# Patient Record
Sex: Female | Born: 1935 | Race: White | Hispanic: No | Marital: Married | Smoking: Former smoker
Health system: Southern US, Community
[De-identification: ages and names within clinical notes are randomized; demographics above are authoritative.]

## PROBLEM LIST (undated history)

## (undated) DIAGNOSIS — I420 Dilated cardiomyopathy: Secondary | ICD-10-CM

## (undated) DIAGNOSIS — R7302 Impaired glucose tolerance (oral): Secondary | ICD-10-CM

## (undated) DIAGNOSIS — C801 Malignant (primary) neoplasm, unspecified: Secondary | ICD-10-CM

## (undated) DIAGNOSIS — I10 Essential (primary) hypertension: Secondary | ICD-10-CM

## (undated) DIAGNOSIS — I428 Other cardiomyopathies: Secondary | ICD-10-CM

## (undated) DIAGNOSIS — I739 Peripheral vascular disease, unspecified: Secondary | ICD-10-CM

## (undated) DIAGNOSIS — I82409 Acute embolism and thrombosis of unspecified deep veins of unspecified lower extremity: Secondary | ICD-10-CM

## (undated) DIAGNOSIS — G510 Bell's palsy: Secondary | ICD-10-CM

## (undated) DIAGNOSIS — I6529 Occlusion and stenosis of unspecified carotid artery: Secondary | ICD-10-CM

## (undated) DIAGNOSIS — I509 Heart failure, unspecified: Secondary | ICD-10-CM

## (undated) DIAGNOSIS — D689 Coagulation defect, unspecified: Secondary | ICD-10-CM

## (undated) DIAGNOSIS — E785 Hyperlipidemia, unspecified: Secondary | ICD-10-CM

## (undated) DIAGNOSIS — K635 Polyp of colon: Secondary | ICD-10-CM

## (undated) HISTORY — DX: Coagulation defect, unspecified: D68.9

## (undated) HISTORY — DX: Dilated cardiomyopathy: I42.0

## (undated) HISTORY — PX: POLYPECTOMY: SHX149

## (undated) HISTORY — PX: OOPHORECTOMY: SHX86

## (undated) HISTORY — DX: Essential (primary) hypertension: I10

## (undated) HISTORY — DX: Other cardiomyopathies: I42.8

## (undated) HISTORY — DX: Impaired glucose tolerance (oral): R73.02

## (undated) HISTORY — DX: Polyp of colon: K63.5

## (undated) HISTORY — DX: Occlusion and stenosis of unspecified carotid artery: I65.29

## (undated) HISTORY — PX: COLONOSCOPY: SHX174

## (undated) HISTORY — DX: Peripheral vascular disease, unspecified: I73.9

## (undated) HISTORY — PX: TUBAL LIGATION: SHX77

## (undated) HISTORY — DX: Bell's palsy: G51.0

## (undated) HISTORY — DX: Heart failure, unspecified: I50.9

## (undated) HISTORY — DX: Acute embolism and thrombosis of unspecified deep veins of unspecified lower extremity: I82.409

## (undated) HISTORY — DX: Malignant (primary) neoplasm, unspecified: C80.1

## (undated) HISTORY — DX: Hyperlipidemia, unspecified: E78.5

## (undated) HISTORY — PX: OTHER SURGICAL HISTORY: SHX169

---

## 1981-12-19 HISTORY — PX: LUMBAR FUSION: SHX111

## 1998-04-24 ENCOUNTER — Ambulatory Visit (HOSPITAL_COMMUNITY): Admission: RE | Admit: 1998-04-24 | Discharge: 1998-04-24 | Payer: Self-pay | Admitting: *Deleted

## 1998-05-04 ENCOUNTER — Ambulatory Visit (HOSPITAL_COMMUNITY): Admission: RE | Admit: 1998-05-04 | Discharge: 1998-05-04 | Payer: Self-pay

## 1998-05-11 ENCOUNTER — Ambulatory Visit (HOSPITAL_COMMUNITY): Admission: RE | Admit: 1998-05-11 | Discharge: 1998-05-11 | Payer: Self-pay

## 1998-05-18 ENCOUNTER — Ambulatory Visit (HOSPITAL_COMMUNITY): Admission: RE | Admit: 1998-05-18 | Discharge: 1998-05-18 | Payer: Self-pay

## 1998-05-22 ENCOUNTER — Ambulatory Visit (HOSPITAL_COMMUNITY): Admission: RE | Admit: 1998-05-22 | Discharge: 1998-05-22 | Payer: Self-pay | Admitting: Gynecology

## 1998-05-29 ENCOUNTER — Ambulatory Visit (HOSPITAL_COMMUNITY): Admission: RE | Admit: 1998-05-29 | Discharge: 1998-05-29 | Payer: Self-pay | Admitting: Gynecology

## 1998-06-05 ENCOUNTER — Ambulatory Visit (HOSPITAL_COMMUNITY): Admission: RE | Admit: 1998-06-05 | Discharge: 1998-06-05 | Payer: Self-pay | Admitting: Gynecology

## 1998-06-15 ENCOUNTER — Ambulatory Visit (HOSPITAL_COMMUNITY): Admission: RE | Admit: 1998-06-15 | Discharge: 1998-06-15 | Payer: Self-pay | Admitting: Gynecology

## 1998-06-23 ENCOUNTER — Ambulatory Visit (HOSPITAL_COMMUNITY): Admission: RE | Admit: 1998-06-23 | Discharge: 1998-06-23 | Payer: Self-pay | Admitting: Gynecology

## 1998-07-01 ENCOUNTER — Ambulatory Visit: Admission: RE | Admit: 1998-07-01 | Discharge: 1998-07-01 | Payer: Self-pay | Admitting: Gynecology

## 1998-07-09 ENCOUNTER — Ambulatory Visit (HOSPITAL_COMMUNITY): Admission: RE | Admit: 1998-07-09 | Discharge: 1998-07-09 | Payer: Self-pay | Admitting: Gynecology

## 1998-07-16 ENCOUNTER — Ambulatory Visit (HOSPITAL_COMMUNITY): Admission: RE | Admit: 1998-07-16 | Discharge: 1998-07-16 | Payer: Self-pay | Admitting: Gynecology

## 1998-12-16 ENCOUNTER — Encounter: Payer: Self-pay | Admitting: Gynecology

## 1998-12-16 ENCOUNTER — Ambulatory Visit (HOSPITAL_COMMUNITY): Admission: RE | Admit: 1998-12-16 | Discharge: 1998-12-16 | Payer: Self-pay | Admitting: Gynecology

## 1998-12-22 ENCOUNTER — Ambulatory Visit: Admission: RE | Admit: 1998-12-22 | Discharge: 1998-12-22 | Payer: Self-pay | Admitting: Gynecology

## 1999-04-07 ENCOUNTER — Ambulatory Visit: Admission: RE | Admit: 1999-04-07 | Discharge: 1999-04-07 | Payer: Self-pay | Admitting: Gynecology

## 1999-07-06 ENCOUNTER — Encounter: Payer: Self-pay | Admitting: Gynecology

## 1999-07-06 ENCOUNTER — Ambulatory Visit (HOSPITAL_COMMUNITY): Admission: RE | Admit: 1999-07-06 | Discharge: 1999-07-06 | Payer: Self-pay | Admitting: Gynecology

## 1999-07-25 ENCOUNTER — Encounter: Payer: Self-pay | Admitting: Emergency Medicine

## 1999-07-25 ENCOUNTER — Emergency Department (HOSPITAL_COMMUNITY): Admission: EM | Admit: 1999-07-25 | Discharge: 1999-07-25 | Payer: Self-pay | Admitting: Emergency Medicine

## 1999-08-10 ENCOUNTER — Ambulatory Visit: Admission: RE | Admit: 1999-08-10 | Discharge: 1999-08-10 | Payer: Self-pay | Admitting: Gynecology

## 1999-11-25 ENCOUNTER — Ambulatory Visit (HOSPITAL_COMMUNITY): Admission: RE | Admit: 1999-11-25 | Discharge: 1999-11-25 | Payer: Self-pay | Admitting: Gynecology

## 1999-11-25 ENCOUNTER — Encounter: Payer: Self-pay | Admitting: Gynecology

## 1999-11-30 ENCOUNTER — Other Ambulatory Visit: Admission: RE | Admit: 1999-11-30 | Discharge: 1999-11-30 | Payer: Self-pay | Admitting: Gynecology

## 1999-11-30 ENCOUNTER — Ambulatory Visit: Admission: RE | Admit: 1999-11-30 | Discharge: 1999-11-30 | Payer: Self-pay | Admitting: Gynecology

## 1999-12-06 ENCOUNTER — Encounter: Admission: RE | Admit: 1999-12-06 | Discharge: 1999-12-06 | Payer: Self-pay | Admitting: Gynecology

## 1999-12-06 ENCOUNTER — Encounter: Payer: Self-pay | Admitting: Gynecology

## 2000-03-15 ENCOUNTER — Ambulatory Visit: Admission: RE | Admit: 2000-03-15 | Discharge: 2000-03-15 | Payer: Self-pay | Admitting: Gynecology

## 2000-03-15 ENCOUNTER — Other Ambulatory Visit: Admission: RE | Admit: 2000-03-15 | Discharge: 2000-03-15 | Payer: Self-pay | Admitting: Gynecology

## 2000-05-29 ENCOUNTER — Encounter: Payer: Self-pay | Admitting: Gynecology

## 2000-05-29 ENCOUNTER — Ambulatory Visit (HOSPITAL_COMMUNITY): Admission: RE | Admit: 2000-05-29 | Discharge: 2000-05-29 | Payer: Self-pay | Admitting: Gynecology

## 2000-05-31 ENCOUNTER — Ambulatory Visit: Admission: RE | Admit: 2000-05-31 | Discharge: 2000-05-31 | Payer: Self-pay | Admitting: Gynecology

## 2000-05-31 ENCOUNTER — Other Ambulatory Visit: Admission: RE | Admit: 2000-05-31 | Discharge: 2000-05-31 | Payer: Self-pay | Admitting: Gynecology

## 2000-09-12 ENCOUNTER — Ambulatory Visit: Admission: RE | Admit: 2000-09-12 | Discharge: 2000-09-12 | Payer: Self-pay | Admitting: Gynecology

## 2000-10-24 ENCOUNTER — Other Ambulatory Visit: Admission: RE | Admit: 2000-10-24 | Discharge: 2000-10-24 | Payer: Self-pay | Admitting: Orthopedic Surgery

## 2000-11-15 ENCOUNTER — Ambulatory Visit (HOSPITAL_COMMUNITY): Admission: RE | Admit: 2000-11-15 | Discharge: 2000-11-15 | Payer: Self-pay | Admitting: Gynecology

## 2000-11-20 ENCOUNTER — Encounter: Payer: Self-pay | Admitting: Gynecology

## 2000-11-20 ENCOUNTER — Ambulatory Visit (HOSPITAL_COMMUNITY): Admission: RE | Admit: 2000-11-20 | Discharge: 2000-11-20 | Payer: Self-pay | Admitting: Gynecology

## 2000-11-29 ENCOUNTER — Ambulatory Visit: Admission: RE | Admit: 2000-11-29 | Discharge: 2000-11-29 | Payer: Self-pay | Admitting: Gynecology

## 2000-11-29 ENCOUNTER — Other Ambulatory Visit: Admission: RE | Admit: 2000-11-29 | Discharge: 2000-11-29 | Payer: Self-pay | Admitting: Gynecology

## 2000-12-06 ENCOUNTER — Encounter: Payer: Self-pay | Admitting: Gynecology

## 2000-12-06 ENCOUNTER — Encounter: Admission: RE | Admit: 2000-12-06 | Discharge: 2000-12-06 | Payer: Self-pay | Admitting: Gynecology

## 2001-05-17 ENCOUNTER — Ambulatory Visit: Admission: RE | Admit: 2001-05-17 | Discharge: 2001-05-17 | Payer: Self-pay | Admitting: Gynecology

## 2001-05-18 ENCOUNTER — Ambulatory Visit (HOSPITAL_COMMUNITY): Admission: RE | Admit: 2001-05-18 | Discharge: 2001-05-18 | Payer: Self-pay | Admitting: Gynecology

## 2001-05-18 ENCOUNTER — Encounter: Payer: Self-pay | Admitting: Gynecology

## 2001-05-23 ENCOUNTER — Ambulatory Visit: Admission: RE | Admit: 2001-05-23 | Discharge: 2001-05-23 | Payer: Self-pay | Admitting: Gynecology

## 2001-12-04 ENCOUNTER — Other Ambulatory Visit: Admission: RE | Admit: 2001-12-04 | Discharge: 2001-12-04 | Payer: Self-pay | Admitting: Gynecology

## 2001-12-04 ENCOUNTER — Ambulatory Visit: Admission: RE | Admit: 2001-12-04 | Discharge: 2001-12-04 | Payer: Self-pay | Admitting: Gynecology

## 2001-12-04 ENCOUNTER — Encounter (INDEPENDENT_AMBULATORY_CARE_PROVIDER_SITE_OTHER): Payer: Self-pay | Admitting: *Deleted

## 2001-12-07 ENCOUNTER — Encounter: Admission: RE | Admit: 2001-12-07 | Discharge: 2001-12-07 | Payer: Self-pay | Admitting: Gynecology

## 2001-12-07 ENCOUNTER — Encounter: Payer: Self-pay | Admitting: Gynecology

## 2002-03-28 ENCOUNTER — Other Ambulatory Visit: Admission: RE | Admit: 2002-03-28 | Discharge: 2002-03-28 | Payer: Self-pay | Admitting: Gynecology

## 2002-04-03 ENCOUNTER — Encounter: Admission: RE | Admit: 2002-04-03 | Discharge: 2002-04-03 | Payer: Self-pay | Admitting: Gynecology

## 2002-04-03 ENCOUNTER — Encounter: Payer: Self-pay | Admitting: Gynecology

## 2002-05-28 ENCOUNTER — Encounter: Payer: Self-pay | Admitting: Gynecology

## 2002-05-28 ENCOUNTER — Ambulatory Visit (HOSPITAL_COMMUNITY): Admission: RE | Admit: 2002-05-28 | Discharge: 2002-05-28 | Payer: Self-pay | Admitting: Gynecology

## 2002-06-12 ENCOUNTER — Other Ambulatory Visit: Admission: RE | Admit: 2002-06-12 | Discharge: 2002-06-12 | Payer: Self-pay | Admitting: Gynecology

## 2002-06-12 ENCOUNTER — Ambulatory Visit: Admission: RE | Admit: 2002-06-12 | Discharge: 2002-06-12 | Payer: Self-pay | Admitting: Gynecology

## 2002-06-12 ENCOUNTER — Encounter (INDEPENDENT_AMBULATORY_CARE_PROVIDER_SITE_OTHER): Payer: Self-pay | Admitting: *Deleted

## 2002-12-09 ENCOUNTER — Encounter: Payer: Self-pay | Admitting: Gynecology

## 2002-12-09 ENCOUNTER — Encounter: Admission: RE | Admit: 2002-12-09 | Discharge: 2002-12-09 | Payer: Self-pay | Admitting: Gynecology

## 2002-12-25 ENCOUNTER — Ambulatory Visit: Admission: RE | Admit: 2002-12-25 | Discharge: 2002-12-25 | Payer: Self-pay | Admitting: Gynecology

## 2003-03-31 ENCOUNTER — Other Ambulatory Visit: Admission: RE | Admit: 2003-03-31 | Discharge: 2003-03-31 | Payer: Self-pay | Admitting: Gynecology

## 2003-07-01 ENCOUNTER — Ambulatory Visit (HOSPITAL_COMMUNITY): Admission: RE | Admit: 2003-07-01 | Discharge: 2003-07-01 | Payer: Self-pay | Admitting: Gynecology

## 2003-07-07 ENCOUNTER — Ambulatory Visit (HOSPITAL_COMMUNITY): Admission: RE | Admit: 2003-07-07 | Discharge: 2003-07-07 | Payer: Self-pay | Admitting: Gynecology

## 2003-07-07 ENCOUNTER — Encounter: Payer: Self-pay | Admitting: Gynecology

## 2003-07-09 ENCOUNTER — Ambulatory Visit: Admission: RE | Admit: 2003-07-09 | Discharge: 2003-07-09 | Payer: Self-pay | Admitting: Gynecology

## 2004-03-11 ENCOUNTER — Encounter: Admission: RE | Admit: 2004-03-11 | Discharge: 2004-03-11 | Payer: Self-pay | Admitting: Gynecology

## 2004-04-01 ENCOUNTER — Other Ambulatory Visit: Admission: RE | Admit: 2004-04-01 | Discharge: 2004-04-01 | Payer: Self-pay | Admitting: Gynecology

## 2004-08-12 ENCOUNTER — Inpatient Hospital Stay (HOSPITAL_COMMUNITY): Admission: EM | Admit: 2004-08-12 | Discharge: 2004-08-15 | Payer: Self-pay | Admitting: Emergency Medicine

## 2004-10-05 ENCOUNTER — Ambulatory Visit: Admission: RE | Admit: 2004-10-05 | Discharge: 2004-10-05 | Payer: Self-pay | Admitting: Gynecology

## 2004-10-05 ENCOUNTER — Encounter (INDEPENDENT_AMBULATORY_CARE_PROVIDER_SITE_OTHER): Payer: Self-pay | Admitting: Specialist

## 2004-11-25 ENCOUNTER — Ambulatory Visit: Payer: Self-pay | Admitting: Internal Medicine

## 2005-03-18 ENCOUNTER — Encounter: Admission: RE | Admit: 2005-03-18 | Discharge: 2005-03-18 | Payer: Self-pay | Admitting: Gynecology

## 2005-07-12 ENCOUNTER — Ambulatory Visit: Payer: Self-pay | Admitting: Internal Medicine

## 2005-08-25 ENCOUNTER — Ambulatory Visit: Payer: Self-pay | Admitting: Internal Medicine

## 2005-09-01 ENCOUNTER — Ambulatory Visit: Payer: Self-pay | Admitting: Internal Medicine

## 2005-10-11 ENCOUNTER — Ambulatory Visit: Payer: Self-pay | Admitting: Gastroenterology

## 2005-10-20 ENCOUNTER — Ambulatory Visit: Payer: Self-pay | Admitting: Internal Medicine

## 2005-10-26 ENCOUNTER — Ambulatory Visit: Payer: Self-pay | Admitting: Gastroenterology

## 2005-12-23 ENCOUNTER — Ambulatory Visit: Payer: Self-pay | Admitting: Internal Medicine

## 2006-03-31 ENCOUNTER — Encounter: Admission: RE | Admit: 2006-03-31 | Discharge: 2006-03-31 | Payer: Self-pay | Admitting: Gynecology

## 2006-04-10 ENCOUNTER — Other Ambulatory Visit: Admission: RE | Admit: 2006-04-10 | Discharge: 2006-04-10 | Payer: Self-pay | Admitting: Gynecology

## 2006-06-16 ENCOUNTER — Ambulatory Visit: Payer: Self-pay | Admitting: Internal Medicine

## 2006-10-18 ENCOUNTER — Ambulatory Visit: Payer: Self-pay | Admitting: Internal Medicine

## 2007-01-24 ENCOUNTER — Ambulatory Visit: Payer: Self-pay | Admitting: Internal Medicine

## 2007-04-10 ENCOUNTER — Encounter: Admission: RE | Admit: 2007-04-10 | Discharge: 2007-04-10 | Payer: Self-pay | Admitting: Gynecology

## 2007-04-19 ENCOUNTER — Other Ambulatory Visit: Admission: RE | Admit: 2007-04-19 | Discharge: 2007-04-19 | Payer: Self-pay | Admitting: Gynecology

## 2007-04-19 LAB — CONVERTED CEMR LAB: Pap Smear: NORMAL

## 2007-06-13 ENCOUNTER — Ambulatory Visit: Payer: Self-pay | Admitting: Internal Medicine

## 2007-06-13 LAB — CONVERTED CEMR LAB
AST: 24 units/L (ref 0–37)
Albumin: 3.6 g/dL (ref 3.5–5.2)
Alkaline Phosphatase: 69 units/L (ref 39–117)
Cholesterol: 136 mg/dL (ref 0–200)
LDL Cholesterol: 87 mg/dL (ref 0–99)
Total CHOL/HDL Ratio: 4.1
VLDL: 16 mg/dL (ref 0–40)

## 2007-07-10 ENCOUNTER — Ambulatory Visit: Payer: Self-pay | Admitting: Internal Medicine

## 2007-07-12 DIAGNOSIS — J309 Allergic rhinitis, unspecified: Secondary | ICD-10-CM

## 2007-07-12 DIAGNOSIS — Z862 Personal history of diseases of the blood and blood-forming organs and certain disorders involving the immune mechanism: Secondary | ICD-10-CM

## 2007-07-12 DIAGNOSIS — I82409 Acute embolism and thrombosis of unspecified deep veins of unspecified lower extremity: Secondary | ICD-10-CM | POA: Insufficient documentation

## 2007-07-12 DIAGNOSIS — Z8669 Personal history of other diseases of the nervous system and sense organs: Secondary | ICD-10-CM

## 2007-07-12 DIAGNOSIS — I10 Essential (primary) hypertension: Secondary | ICD-10-CM | POA: Insufficient documentation

## 2007-07-12 DIAGNOSIS — E785 Hyperlipidemia, unspecified: Secondary | ICD-10-CM

## 2007-07-12 DIAGNOSIS — Z8639 Personal history of other endocrine, nutritional and metabolic disease: Secondary | ICD-10-CM

## 2007-07-12 DIAGNOSIS — Z86010 Personal history of colonic polyps: Secondary | ICD-10-CM

## 2007-07-12 DIAGNOSIS — D259 Leiomyoma of uterus, unspecified: Secondary | ICD-10-CM

## 2007-10-23 ENCOUNTER — Ambulatory Visit: Payer: Self-pay | Admitting: Internal Medicine

## 2007-10-23 LAB — CONVERTED CEMR LAB
ALT: 25 units/L (ref 0–35)
AST: 24 units/L (ref 0–37)
Alkaline Phosphatase: 74 units/L (ref 39–117)
BUN: 15 mg/dL (ref 6–23)
Basophils Absolute: 0 10*3/uL (ref 0.0–0.1)
Basophils Relative: 0.4 % (ref 0.0–1.0)
Bilirubin Urine: NEGATIVE
Bilirubin, Direct: 0.1 mg/dL (ref 0.0–0.3)
Cholesterol: 125 mg/dL (ref 0–200)
Eosinophils Absolute: 0.1 10*3/uL (ref 0.0–0.6)
Eosinophils Relative: 3 % (ref 0.0–5.0)
GFR calc Af Amer: 91 mL/min
Glucose, Bld: 98 mg/dL (ref 70–99)
HCT: 45.2 % (ref 36.0–46.0)
Hemoglobin: 15.5 g/dL — ABNORMAL HIGH (ref 12.0–15.0)
LDL Cholesterol: 75 mg/dL (ref 0–99)
Leukocytes, UA: NEGATIVE
Lymphocytes Relative: 21.2 % (ref 12.0–46.0)
MCHC: 34.2 g/dL (ref 30.0–36.0)
MCV: 88.3 fL (ref 78.0–100.0)
Monocytes Absolute: 0.3 10*3/uL (ref 0.2–0.7)
Neutro Abs: 3.5 10*3/uL (ref 1.4–7.7)
Neutrophils Relative %: 69.3 % (ref 43.0–77.0)
Nitrite: NEGATIVE
Platelets: 174 10*3/uL (ref 150–400)
RBC: 5.12 M/uL — ABNORMAL HIGH (ref 3.87–5.11)
TSH: 3.09 microintl units/mL (ref 0.35–5.50)
Total Bilirubin: 0.8 mg/dL (ref 0.3–1.2)
VLDL: 20 mg/dL (ref 0–40)
WBC: 5 10*3/uL (ref 4.5–10.5)
pH: 6.5 (ref 5.0–8.0)

## 2008-01-03 ENCOUNTER — Telehealth: Payer: Self-pay | Admitting: Internal Medicine

## 2008-05-20 ENCOUNTER — Encounter: Admission: RE | Admit: 2008-05-20 | Discharge: 2008-05-20 | Payer: Self-pay | Admitting: Gynecology

## 2008-05-22 ENCOUNTER — Encounter: Payer: Self-pay | Admitting: Internal Medicine

## 2008-05-27 ENCOUNTER — Encounter: Admission: RE | Admit: 2008-05-27 | Discharge: 2008-05-27 | Payer: Self-pay | Admitting: Gynecology

## 2008-08-09 ENCOUNTER — Ambulatory Visit: Payer: Self-pay | Admitting: Family Medicine

## 2008-08-09 DIAGNOSIS — R1012 Left upper quadrant pain: Secondary | ICD-10-CM

## 2008-08-09 LAB — CONVERTED CEMR LAB
Bilirubin Urine: NEGATIVE
Urobilinogen, UA: 0.2
pH: 6

## 2008-08-11 ENCOUNTER — Ambulatory Visit: Payer: Self-pay | Admitting: Internal Medicine

## 2008-08-11 LAB — CONVERTED CEMR LAB: BUN: 13 mg/dL (ref 6–23)

## 2008-09-23 ENCOUNTER — Ambulatory Visit: Payer: Self-pay | Admitting: Internal Medicine

## 2008-10-20 ENCOUNTER — Telehealth (INDEPENDENT_AMBULATORY_CARE_PROVIDER_SITE_OTHER): Payer: Self-pay | Admitting: *Deleted

## 2008-10-20 ENCOUNTER — Ambulatory Visit: Payer: Self-pay | Admitting: Internal Medicine

## 2008-10-20 DIAGNOSIS — J209 Acute bronchitis, unspecified: Secondary | ICD-10-CM | POA: Insufficient documentation

## 2008-11-28 ENCOUNTER — Encounter: Payer: Self-pay | Admitting: Internal Medicine

## 2008-12-03 ENCOUNTER — Telehealth (INDEPENDENT_AMBULATORY_CARE_PROVIDER_SITE_OTHER): Payer: Self-pay | Admitting: *Deleted

## 2009-01-16 ENCOUNTER — Ambulatory Visit: Payer: Self-pay | Admitting: Internal Medicine

## 2009-01-16 DIAGNOSIS — R030 Elevated blood-pressure reading, without diagnosis of hypertension: Secondary | ICD-10-CM | POA: Insufficient documentation

## 2009-01-16 DIAGNOSIS — I739 Peripheral vascular disease, unspecified: Secondary | ICD-10-CM

## 2009-01-28 ENCOUNTER — Ambulatory Visit: Payer: Self-pay

## 2009-01-28 ENCOUNTER — Encounter: Payer: Self-pay | Admitting: Internal Medicine

## 2009-02-02 ENCOUNTER — Telehealth: Payer: Self-pay | Admitting: Internal Medicine

## 2009-05-25 ENCOUNTER — Encounter: Admission: RE | Admit: 2009-05-25 | Discharge: 2009-05-25 | Payer: Self-pay | Admitting: Gynecology

## 2009-06-01 ENCOUNTER — Encounter: Payer: Self-pay | Admitting: Internal Medicine

## 2009-08-27 ENCOUNTER — Ambulatory Visit: Payer: Self-pay | Admitting: Internal Medicine

## 2009-09-08 ENCOUNTER — Telehealth: Payer: Self-pay | Admitting: Internal Medicine

## 2009-09-29 ENCOUNTER — Ambulatory Visit: Payer: Self-pay | Admitting: Internal Medicine

## 2010-02-26 ENCOUNTER — Telehealth: Payer: Self-pay | Admitting: Internal Medicine

## 2010-02-26 ENCOUNTER — Ambulatory Visit: Payer: Self-pay | Admitting: Internal Medicine

## 2010-02-26 DIAGNOSIS — J069 Acute upper respiratory infection, unspecified: Secondary | ICD-10-CM | POA: Insufficient documentation

## 2010-02-26 DIAGNOSIS — R0600 Dyspnea, unspecified: Secondary | ICD-10-CM | POA: Insufficient documentation

## 2010-03-01 ENCOUNTER — Telehealth (INDEPENDENT_AMBULATORY_CARE_PROVIDER_SITE_OTHER): Payer: Self-pay | Admitting: *Deleted

## 2010-03-11 ENCOUNTER — Telehealth (INDEPENDENT_AMBULATORY_CARE_PROVIDER_SITE_OTHER): Payer: Self-pay | Admitting: *Deleted

## 2010-03-11 ENCOUNTER — Ambulatory Visit: Payer: Self-pay | Admitting: Internal Medicine

## 2010-03-11 DIAGNOSIS — I509 Heart failure, unspecified: Secondary | ICD-10-CM

## 2010-03-18 ENCOUNTER — Ambulatory Visit (HOSPITAL_COMMUNITY): Admission: RE | Admit: 2010-03-18 | Discharge: 2010-03-18 | Payer: Self-pay | Admitting: Internal Medicine

## 2010-03-18 ENCOUNTER — Encounter: Payer: Self-pay | Admitting: Internal Medicine

## 2010-03-18 ENCOUNTER — Ambulatory Visit: Payer: Self-pay | Admitting: Internal Medicine

## 2010-03-18 ENCOUNTER — Ambulatory Visit: Payer: Self-pay | Admitting: Cardiology

## 2010-03-18 ENCOUNTER — Ambulatory Visit: Payer: Self-pay

## 2010-03-19 ENCOUNTER — Telehealth: Payer: Self-pay | Admitting: Internal Medicine

## 2010-03-22 ENCOUNTER — Encounter: Payer: Self-pay | Admitting: Internal Medicine

## 2010-03-22 ENCOUNTER — Encounter: Payer: Self-pay | Admitting: Cardiology

## 2010-03-25 ENCOUNTER — Ambulatory Visit: Payer: Self-pay | Admitting: Cardiology

## 2010-04-01 ENCOUNTER — Ambulatory Visit: Payer: Self-pay | Admitting: Cardiology

## 2010-04-01 ENCOUNTER — Inpatient Hospital Stay (HOSPITAL_BASED_OUTPATIENT_CLINIC_OR_DEPARTMENT_OTHER): Admission: RE | Admit: 2010-04-01 | Discharge: 2010-04-01 | Payer: Self-pay | Admitting: Cardiology

## 2010-04-19 ENCOUNTER — Ambulatory Visit: Payer: Self-pay | Admitting: Cardiology

## 2010-05-26 ENCOUNTER — Encounter: Admission: RE | Admit: 2010-05-26 | Discharge: 2010-05-26 | Payer: Self-pay | Admitting: Gynecology

## 2010-06-02 ENCOUNTER — Encounter: Payer: Self-pay | Admitting: Internal Medicine

## 2010-06-09 ENCOUNTER — Ambulatory Visit: Payer: Self-pay | Admitting: Internal Medicine

## 2010-06-09 LAB — CONVERTED CEMR LAB
ALT: 16 units/L (ref 0–35)
CO2: 32 meq/L (ref 19–32)
Calcium: 9.5 mg/dL (ref 8.4–10.5)
Chloride: 106 meq/L (ref 96–112)
Cholesterol: 164 mg/dL (ref 0–200)
Creatinine, Ser: 0.9 mg/dL (ref 0.4–1.2)
Direct LDL: 98.6 mg/dL
Folate: >20 ng/mL
Glucose, Bld: 97 mg/dL (ref 70–99)
Ketones, ur: NEGATIVE mg/dL
Lymphocytes Relative: 19.4 % (ref 12.0–46.0)
Monocytes Absolute: 0.4 10*3/uL (ref 0.1–1.0)
Monocytes Relative: 7.9 % (ref 3.0–12.0)
Neutro Abs: 3.3 10*3/uL (ref 1.4–7.7)
Neutrophils Relative %: 70.3 % (ref 43.0–77.0)
RBC: 4.82 M/uL (ref 3.87–5.11)
Total CHOL/HDL Ratio: 4
Total Protein, Urine: NEGATIVE mg/dL
Triglycerides: 214 mg/dL — ABNORMAL HIGH (ref 0.0–149.0)
Urobilinogen, UA: 0.2 (ref 0.0–1.0)
Vitamin B-12: 738 pg/mL (ref 211–911)

## 2010-06-10 LAB — CONVERTED CEMR LAB: Vit D, 25-Hydroxy: 73 ng/mL (ref 30–89)

## 2010-06-22 ENCOUNTER — Encounter: Payer: Self-pay | Admitting: Cardiology

## 2010-06-24 ENCOUNTER — Ambulatory Visit: Payer: Self-pay | Admitting: Cardiology

## 2010-08-20 ENCOUNTER — Ambulatory Visit: Payer: Self-pay | Admitting: Cardiology

## 2010-08-20 DIAGNOSIS — R002 Palpitations: Secondary | ICD-10-CM | POA: Insufficient documentation

## 2010-09-09 ENCOUNTER — Ambulatory Visit: Payer: Self-pay | Admitting: Internal Medicine

## 2010-09-23 ENCOUNTER — Ambulatory Visit: Payer: Self-pay | Admitting: Cardiology

## 2010-10-05 ENCOUNTER — Encounter (INDEPENDENT_AMBULATORY_CARE_PROVIDER_SITE_OTHER): Payer: Self-pay | Admitting: *Deleted

## 2010-10-26 ENCOUNTER — Ambulatory Visit: Payer: Self-pay | Admitting: Cardiology

## 2010-10-26 DIAGNOSIS — I428 Other cardiomyopathies: Secondary | ICD-10-CM | POA: Insufficient documentation

## 2010-12-14 ENCOUNTER — Telehealth: Payer: Self-pay | Admitting: Cardiology

## 2010-12-22 ENCOUNTER — Encounter (INDEPENDENT_AMBULATORY_CARE_PROVIDER_SITE_OTHER): Payer: Self-pay | Admitting: *Deleted

## 2010-12-23 ENCOUNTER — Encounter (INDEPENDENT_AMBULATORY_CARE_PROVIDER_SITE_OTHER): Payer: Self-pay | Admitting: *Deleted

## 2010-12-23 ENCOUNTER — Ambulatory Visit
Admission: RE | Admit: 2010-12-23 | Discharge: 2010-12-23 | Payer: Self-pay | Source: Home / Self Care | Attending: Cardiology | Admitting: Cardiology

## 2010-12-24 ENCOUNTER — Ambulatory Visit
Admission: RE | Admit: 2010-12-24 | Discharge: 2010-12-24 | Payer: Self-pay | Source: Home / Self Care | Attending: Gastroenterology | Admitting: Gastroenterology

## 2011-01-04 ENCOUNTER — Telehealth: Payer: Self-pay | Admitting: Gastroenterology

## 2011-01-05 ENCOUNTER — Encounter: Payer: Self-pay | Admitting: Gastroenterology

## 2011-01-05 ENCOUNTER — Ambulatory Visit
Admission: RE | Admit: 2011-01-05 | Discharge: 2011-01-05 | Payer: Self-pay | Source: Home / Self Care | Attending: Gastroenterology | Admitting: Gastroenterology

## 2011-01-16 LAB — CONVERTED CEMR LAB
ALT: 24 units/L (ref 0–35)
ALT: 29 units/L (ref 0–35)
AST: 33 units/L (ref 0–37)
Albumin: 3.8 g/dL (ref 3.5–5.2)
Albumin: 4.4 g/dL (ref 3.5–5.2)
BUN: 16 mg/dL (ref 6–23)
BUN: 19 mg/dL (ref 6–23)
Basophils Absolute: 0 10*3/uL (ref 0.0–0.1)
Basophils Absolute: 0 10*3/uL (ref 0.0–0.1)
Basophils Relative: 0 % (ref 0.0–3.0)
Basophils Relative: 0.5 % (ref 0.0–3.0)
Bilirubin Urine: NEGATIVE
CO2: 30 meq/L (ref 19–32)
CO2: 31 meq/L (ref 19–32)
Calcium: 9.6 mg/dL (ref 8.4–10.5)
Calcium: 9.7 mg/dL (ref 8.4–10.5)
Chloride: 107 meq/L (ref 96–112)
Chloride: 108 meq/L (ref 96–112)
Creatinine, Ser: 0.8 mg/dL (ref 0.4–1.2)
Creatinine, Ser: 0.8 mg/dL (ref 0.4–1.2)
Creatinine, Ser: 0.9 mg/dL (ref 0.4–1.2)
Eosinophils Absolute: 0.1 10*3/uL (ref 0.0–0.7)
Eosinophils Absolute: 0.1 10*3/uL (ref 0.0–0.7)
Eosinophils Relative: 1.3 % (ref 0.0–5.0)
Eosinophils Relative: 1.6 % (ref 0.0–5.0)
GFR calc Af Amer: 91 mL/min
GFR calc non Af Amer: 65.51 mL/min (ref >60)
Glucose, Bld: 102 mg/dL — ABNORMAL HIGH (ref 70–99)
HCT: 44.9 % (ref 36.0–46.0)
HCT: 45.2 % (ref 36.0–46.0)
HDL: 28 mg/dL — ABNORMAL LOW (ref >39.0)
Hemoglobin: 15.8 g/dL — ABNORMAL HIGH (ref 12.0–15.0)
INR: 1 (ref 0.8–1.0)
LDL Cholesterol: 60 mg/dL (ref 0–99)
Leukocytes, UA: NEGATIVE
Lymphocytes Relative: 18.4 % (ref 12.0–46.0)
Lymphocytes Relative: 23.7 % (ref 12.0–46.0)
Lymphs Abs: 1 10*3/uL (ref 0.7–4.0)
MCHC: 32.8 g/dL (ref 30.0–36.0)
MCHC: 34 g/dL (ref 30.0–36.0)
MCHC: 34.6 g/dL (ref 30.0–36.0)
MCV: 88.9 fL (ref 78.0–100.0)
Monocytes Absolute: 0.4 10*3/uL (ref 0.1–1.0)
Monocytes Absolute: 0.5 10*3/uL (ref 0.1–1.0)
Monocytes Relative: 8.1 % (ref 3.0–12.0)
Neutro Abs: 2.9 10*3/uL (ref 1.4–7.7)
Neutro Abs: 5.8 10*3/uL (ref 1.4–7.7)
Neutrophils Relative %: 78.7 % — ABNORMAL HIGH (ref 43.0–77.0)
Nitrite: NEGATIVE
Platelets: 149 10*3/uL — ABNORMAL LOW (ref 150.0–400.0)
Platelets: 163 10*3/uL (ref 150.0–400.0)
Prothrombin Time: 10.7 s (ref 9.1–11.7)
RBC: 4.93 M/uL (ref 3.87–5.11)
RBC: 5.06 M/uL (ref 3.87–5.11)
RDW: 13.7 % (ref 11.5–14.6)
RDW: 13.8 % (ref 11.5–14.6)
RDW: 14.4 % (ref 11.5–14.6)
Specific Gravity, Urine: 1.025 (ref 1.000–1.03)
Specific Gravity, Urine: 1.025 (ref 1.000–1.030)
Total CHOL/HDL Ratio: 3
Total CHOL/HDL Ratio: 3.9
Total Protein: 7.7 g/dL (ref 6.0–8.3)
Triglycerides: 124 mg/dL (ref 0.0–149.0)
Urine Glucose: NEGATIVE mg/dL
VLDL: 20 mg/dL (ref 0–40)
Vit D, 1,25-Dihydroxy: 55 (ref 30–89)
WBC: 4.4 10*3/uL — ABNORMAL LOW (ref 4.5–10.5)
WBC: 6.2 10*3/uL (ref 4.5–10.5)
pH: 6.5 (ref 5.0–8.0)

## 2011-01-20 NOTE — Miscellaneous (Signed)
Summary: previsit prep/rm  Clinical Lists Changes  Medications: Added new medication of MOVIPREP 100 GM  SOLR (PEG-KCL-NACL-NASULF-NA ASC-C) As per prep instructions. - Signed Rx of MOVIPREP 100 GM  SOLR (PEG-KCL-NACL-NASULF-NA ASC-C) As per prep instructions.;  #1 x 0;  Signed;  Entered by: Sherren Kerns RN;  Authorized by: Mardella Layman MD [facility];  Method used: [facility]  [address] [id number] [address] [place], [place]  [address], Ph: [phone] or [phone], Fax: [phone] Observations: Added new observation of ALLERGY REV: Done (12/24/2010 13:35)    Prescriptions: MOVIPREP 100 GM  SOLR (PEG-KCL-NACL-NASULF-NA ASC-C) As per prep instructions.  #1 x 0   Entered by:   Sherren Kerns RN   Authorized by:   Mardella Layman MD [facility]   Signed by:   Sherren Kerns RN on 12/24/2010   Method used:   [facility]        [facility]  [address] [id number]       [address]       [place], [place]  [address]       Ph: [phone] or [phone]       Fax: [phone]   RxID:   [id number]

## 2011-01-20 NOTE — Assessment & Plan Note (Signed)
Summary: 3 MTH FU  STC   Vital Signs:  Patient profile:   75 year old female Height:      65 inches Weight:      131.25 pounds BMI:     21.92 O2 Sat:      96 % on Room air Temp:     98.1 degrees F oral Pulse rate:   73 / minute BP sitting:   118 / 60  (left arm) Cuff size:   regular  Vitals Entered By: Zella Ball Ewing CMA (AAMA) (June 09, 2010 10:12 AM)  O2 Flow:  Room air  CC: 3 month followup/RE   Primary Care Provider:  Corwin Levins MD  CC:  3 month followup/RE.  History of Present Illness: overall doing well on current meds,  Pt denies CP, sob, doe, wheezing, orthopnea, pnd, worsening LE edema, palps, dizziness or syncope  Pt denies new neuro symptoms such as headache, facial or extremity weakness    Problems Prior to Update: 1)  Unspecified Heart Failure  (ICD-428.9) 2)  Uri  (ICD-465.9) 3)  Dyspnea  (ICD-786.05) 4)  Bite of Other Animal Except Arthropod  (ICD-E906.3) 5)  Elevated Blood Pressure Without Diagnosis of Hypertension  (ICD-796.2) 6)  Peripheral Vascular Disease  (ICD-443.9) 7)  Preventive Health Care  (ICD-V70.0) 8)  Asthmatic Bronchitis, Acute  (ICD-466.0) 9)  Abdominal Pain, Left Upper Quadrant  (ICD-789.02) 10)  Preventive Health Care  (ICD-V70.0) 11)  Family History of Cad Female 1st Degree Relative <60  (ICD-V16.49) 12)  Family History of Cad Female 1st Degree Relative <50  (ICD-V17.3) 13)  Leiomyoma, Uterus  (ICD-218.9) 14)  Dvt  (ICD-453.40) 15)  Bell's Palsy, Hx of  (ICD-V12.49) 16)  Glucose Intolerance, Hx of  (ICD-V12.2) 17)  Allergic Rhinitis  (ICD-477.9) 18)  Hypertension  (ICD-401.9) 19)  Hyperlipidemia  (ICD-272.4) 20)  Colonic Polyps, Hx of  (ICD-V12.72)  Medications Prior to Update: 1)  Adult Aspirin Low Strength 81 Mg  Tbdp (Aspirin) 2)  Daily Vitamins   Tabs (Multiple Vitamin) 3)  Proair Hfa 108 (90 Base) Mcg/act Aers (Albuterol Sulfate) .... 2 Puffs Qid As Needed 4)  Vitamin D + Calcium 5)  Furosemide 40 Mg Tabs (Furosemide) .Marland Kitchen.. 1  By Mouth Once Daily 6)  Lisinopril 5 Mg Tabs (Lisinopril) .... One Daily 7)  Pravastatin Sodium 80 Mg Tabs (Pravastatin Sodium) .... One At Bedtime  Current Medications (verified): 1)  Adult Aspirin Low Strength 81 Mg  Tbdp (Aspirin) 2)  Daily Vitamins   Tabs (Multiple Vitamin) 3)  Proair Hfa 108 (90 Base) Mcg/act Aers (Albuterol Sulfate) .... 2 Puffs Qid As Needed 4)  Vitamin D + Calcium 5)  Furosemide 40 Mg Tabs (Furosemide) .Marland Kitchen.. 1 By Mouth Once Daily 6)  Lisinopril 5 Mg Tabs (Lisinopril) .... One Daily 7)  Pravastatin Sodium 80 Mg Tabs (Pravastatin Sodium) .... One At Bedtime  Allergies (verified): 1)  ! Codeine 2)  ! * Bee Stings  Past History:  Past Medical History: Last updated: 01/16/2009 Colonic polyps, hx of Hyperlipidemia Hypertension, borderline Allergic rhinitis H/O Glucose Intolerance H/O Bells Palsy H/O Uterine Leimyosarcoma H/O DVT - lle 2000 Peripheral vascular disease - mild bilat carotid  Past Surgical History: Last updated: 10/23/2007 Lumbar fusion-1983 Hysterectomy Oophorectomy s/p IVC filter  Family History: Last updated: 03/25/2010 sister with CLL sister with acute leukemia Family History of CAD Female 1st degree relative <50 (Father MI 91, died 79) Family History of CAD Female 1st degree relative <60 (Mother with CAD age 27, died  72) Family History High cholesterol Family History Lung cancer mother with ostoeoporosis and COPD 2 brother died with lung cancer/smokers 1 sister died with melanoma 1 sister died with liver cancer  Social History: Last updated: 03/25/2010 Former Smoker (quit 30 years ago) Alcohol use-no Married 2 grown children Retired - Printmaker - underwristing  Risk Factors: Smoking Status: quit (10/23/2007)  Review of Systems  The patient denies anorexia, fever, weight loss, weight gain, vision loss, decreased hearing, hoarseness, chest pain, syncope, dyspnea on exertion, peripheral edema, prolonged cough,  headaches, hemoptysis, abdominal pain, melena, hematochezia, severe indigestion/heartburn, hematuria, muscle weakness, suspicious skin lesions, transient blindness, difficulty walking, depression, unusual weight change, abnormal bleeding, enlarged lymph nodes, and angioedema.         all otherwise negative per pt -    Physical Exam  General:  alert and well-developed.   Head:  normocephalic and atraumatic.   Eyes:  vision grossly intact, pupils equal, and pupils round.   Ears:  R ear normal and L ear normal.   Nose:  no external deformity and no nasal discharge.   Mouth:  no gingival abnormalities and pharynx pink and moist.   Neck:  supple and no masses.   Lungs:  normal respiratory effort and normal breath sounds.   Heart:  normal rate and regular rhythm.   Abdomen:  soft, non-tender, and normal bowel sounds.   Msk:  no joint tenderness and no joint swelling.   Extremities:  no edema, no erythema  Neurologic:  cranial nerves II-XII intact and strength normal in all extremities.   Skin:  color normal and no rashes.   Psych:  normally interactive and slightly anxious.     Impression & Recommendations:  Problem # 1:  Preventive Health Care (ICD-V70.0)  Overall doing well, age appropriate education and counseling updated and referral for appropriate preventive services done unless declined, immunizations up to date or declined, diet counseling done if overweight, urged to quit smoking if smokes , most recent labs reviewed and current ordered if appropriate, ecg reviewed or declined (interpretation per ECG scanned in the EMR if done); information regarding Medicare Prevention requirements given if appropriate; speciality referrals updated as appropriate   Orders: T-Vitamin D (25-Hydroxy) (04540-98119) TLB-BMP (Basic Metabolic Panel-BMET) (80048-METABOL) TLB-CBC Platelet - w/Differential (85025-CBCD) TLB-Hepatic/Liver Function Pnl (80076-HEPATIC) TLB-Lipid Panel (80061-LIPID) TLB-Udip  ONLY (81003-UDIP) TLB-TSH (Thyroid Stimulating Hormone) (84443-TSH) TLB-B12 + Folate Pnl (14782_95621-H08/MVH)  Problem # 2:  UNSPECIFIED HEART FAILURE (ICD-428.9)  Her updated medication list for this problem includes:    Adult Aspirin Low Strength 81 Mg Tbdp (Aspirin)    Furosemide 40 Mg Tabs (Furosemide) .Marland Kitchen... 1 by mouth once daily    Lisinopril 5 Mg Tabs (Lisinopril) ..... One daily  Orders: TLB-BNP (B-Natriuretic Peptide) (83880-BNPR) stable overall by hx and exam, ok to continue meds/tx as is   Problem # 3:  HYPERTENSION (ICD-401.9)  Her updated medication list for this problem includes:    Furosemide 40 Mg Tabs (Furosemide) .Marland Kitchen... 1 by mouth once daily    Lisinopril 5 Mg Tabs (Lisinopril) ..... One daily  BP today: 118/60 Prior BP: 120/70 (04/19/2010)  Labs Reviewed: Creat: 0.9 (03/25/2010) Chol: 133 (02/26/2010)   HDL: 44.00 (02/26/2010)   LDL: 64 (02/26/2010)   TG: 124.0 (02/26/2010)  stable overall by hx and exam, ok to continue meds/tx as is   Problem # 4:  HYPERLIPIDEMIA (ICD-272.4)  Her updated medication list for this problem includes:    Pravastatin Sodium 80 Mg Tabs (Pravastatin sodium) .Marland KitchenMarland KitchenMarland KitchenMarland Kitchen  One at bedtime  Labs Reviewed: SGOT: 22 (02/26/2010)   SGPT: 24 (02/26/2010)   HDL:44.00 (02/26/2010), 28.0 (01/16/2009)  LDL:64 (02/26/2010), 60 (01/16/2009)  Chol:133 (02/26/2010), 108 (01/16/2009)  Trig:124.0 (02/26/2010), 100 (01/16/2009) stable overall by hx and exam, ok to continue meds/tx as is . Pt to continue diet efforts, good med tolerance; - goal LDL less than 70   Complete Medication List: 1)  Adult Aspirin Low Strength 81 Mg Tbdp (Aspirin) 2)  Daily Vitamins Tabs (Multiple vitamin) 3)  Proair Hfa 108 (90 Base) Mcg/act Aers (Albuterol sulfate) .... 2 puffs qid as needed 4)  Vitamin D + Calcium  5)  Furosemide 40 Mg Tabs (Furosemide) .Marland Kitchen.. 1 by mouth once daily 6)  Lisinopril 5 Mg Tabs (Lisinopril) .... One daily 7)  Pravastatin Sodium 80 Mg Tabs  (Pravastatin sodium) .... One at bedtime  Patient Instructions: 1)  Continue all previous medications as before this visit 2)  Please go to the Lab in the basement for your blood and/or urine tests today 3)  Please schedule a follow-up appointment in 1 year or sooner if needed 4)  Please Keep your Appt with Dr Antoine Poche as planned

## 2011-01-20 NOTE — Assessment & Plan Note (Signed)
Summary: flu shot/john/cd  Nurse Visit   Allergies: 1)  ! Codeine 2)  ! * Bee Stings  Orders Added: 1)  Flu Vaccine 72yrs + MEDICARE PATIENTS [Q2039] 2)  Administration Flu vaccine - MCR [G0008] Flu Vaccine Consent Questions     Do you have a history of severe allergic reactions to this vaccine? no    Any prior history of allergic reactions to egg and/or gelatin? no    Do you have a sensitivity to the preservative Thimersol? no    Do you have a past history of Guillan-Barre Syndrome? no    Do you currently have an acute febrile illness? no    Have you ever had a severe reaction to latex? no    Vaccine information given and explained to patient? yes    Are you currently pregnant? no    Lot Number:[id number]   Exp Date:06/18/2011   Site Given  Left Deltoid IM.lbmedflu

## 2011-01-20 NOTE — Progress Notes (Signed)
----   Converted from flag ---- ---- 03/11/2010 1:18 PM, Edman Circle wrote: appt 4/13 @ 3:45 with Hochrein  ---- 03/11/2010 12:59 PM, Dagoberto Reef wrote: Thanks  ---- 03/11/2010 11:58 AM, Corwin Levins MD wrote: The following orders have been entered for this patient and placed on Admin Hold:  Type:     Referral       Code:   Misc. Ref Description:   Misc. Referral Order Date:   03/11/2010   Authorized By:   Corwin Levins MD Order #:   [id number] Clinical Notes:   Type of Referral: PFT's Reason: ------------------------------

## 2011-01-20 NOTE — Progress Notes (Signed)
Summary: Appointment request  Phone Note Call from Patient Call back at Home Phone [phone]   Caller: Patient Summary of Call: pt left message on triage stating that she has been having SOB and a deep cough, she has an appt on 3/24 for CPX, but wants a sooner appt if it does not interfere with her insurance. Transferred pt to scheduling Initial call taken by: Sydell Axon February 26, 2010 8:55 AM

## 2011-01-20 NOTE — Progress Notes (Signed)
Summary: Cardio Referral  Phone Note Call from Patient Call back at Home Phone [phone]   Caller: Patient Summary of Call: pt called requesting an earlier appt with cardiology because of results of ECHO. Initial call taken by: Margaret Pyle, CMA,  March 19, 2010 4:18 PM  Follow-up for Phone Call        robin to call Dr hochrein's nurse to inquire if the pt can be worked in any sooner than apr 13, due to finding of new severe cardiomyopathy with EF < 15%  (high risk for heart failure and sudden death) Follow-up by: Corwin Levins MD,  March 19, 2010 4:47 PM  Additional Follow-up for Phone Call Additional follow up Details #1::        Called and changed pts appt to 03/25/2010 at 9:30am. I called the pt., she is very anxious to get it even sooner and requested seeing any MD as she has no preference. Please advise Additional Follow-up by: Scharlene Gloss,  March 22, 2010 8:11 AM    Additional Follow-up for Phone Call Additional follow up Details #2::    unfortuanately, this is the soonest we can get;  I think Apr 7 is very soon under the circumstances; I can provide mild medication for nerves if she needs this;  please let me know Follow-up by: Corwin Levins MD,  March 22, 2010 8:57 AM  Additional Follow-up for Phone Call Additional follow up Details #3:: Details for Additional Follow-up Action Taken: called pt and she does not need any medication. Additional Follow-up by: Scharlene Gloss,  March 22, 2010 10:51 AM

## 2011-01-20 NOTE — Assessment & Plan Note (Signed)
Summary: 1 month rov.sl   Visit Type:  Follow-up Primary Provider:  Corwin Levins MD  CC:  Cardiomyopathy.  History of Present Illness: The patient presents for followup of her nonischemic cardiomyopathy. At the last appointment I started carvedilol. She tolerated this without problems. She has had no lightheadedness, presyncope or syncope. She has had no chest pressure, neck or arm discomfort. She has had no palpitations, presyncope or syncope. She has had no PND or orthopnea.  Current Medications (verified): 1)  Adult Aspirin Low Strength 81 Mg  Tbdp (Aspirin) 2)  Daily Vitamins   Tabs (Multiple Vitamin) 3)  Proair Hfa 108 (90 Base) Mcg/act Aers (Albuterol Sulfate) .... 2 Puffs Qid As Needed 4)  Vitamin D + Calcium 5)  Furosemide 40 Mg Tabs (Furosemide) .Marland Kitchen.. 1 By Mouth Once Daily 6)  Lisinopril 10 Mg Tabs (Lisinopril) .... One Daily 7)  Pravastatin Sodium 80 Mg Tabs (Pravastatin Sodium) .... One At Bedtime 8)  Carvedilol 3.125 Mg Tabs (Carvedilol) .... One Twice A Day  Allergies (verified): 1)  ! Codeine 2)  ! * Bee Stings  Past History:  Past Medical History: Reviewed history from 08/20/2010 and no changes required. Colonic polyps, hx of Hyperlipidemia Hypertension, borderline Allergic rhinitis H/O Glucose Intolerance H/O Bells Palsy H/O Uterine Leimyosarcoma H/O DVT - lle 2000 Peripheral vascular disease - mild bilat carotid Cardiomyopathy (nonischemic EF 25%)  Past Surgical History: Reviewed history from 10/23/2007 and no changes required. Lumbar [id number] Hysterectomy Oophorectomy s/p IVC filter  Review of Systems       As stated in the HPI and negative for all other systems.   Vital Signs:  Patient profile:   75 year old female Height:      65 inches Weight:      131 pounds BMI:     21.88 Pulse rate:   76 / minute Resp:     16 per minute BP sitting:   142 / 61  (left arm)  Vitals Entered By: Marrion Coy, CNA (September 23, 2010 9:56 AM)  Physical  Exam  General:  Well developed, well nourished, in no acute distress. Head:  normocephalic and atraumatic Eyes:  PERRLA/EOM intact; conjunctiva and lids normal. Neck:  Neck supple, no JVD. No masses, thyromegaly or abnormal cervical nodes. Chest Wall:  no deformities or breast masses noted Lungs:  Clear bilaterally to auscultation and percussion. Abdomen:  Bowel sounds positive; abdomen soft and non-tender without masses, organomegaly, or hernias noted. No hepatosplenomegaly. Msk:  Back normal, normal gait. Muscle strength and tone normal. Extremities:  No clubbing or cyanosis. Neurologic:  Alert and oriented x 3. Skin:  Intact without lesions or rashes. Cervical Nodes:  no significant adenopathy Axillary Nodes:  no significant adenopathy Inguinal Nodes:  no significant adenopathy Psych:  Normal affect.   Detailed Cardiovascular Exam  Neck    Carotids: Carotids full and equal bilaterally without bruits.      Neck Veins: Normal, no JVD.    Heart    Inspection: no deformities or lifts noted.      Palpation: normal PMI with no thrills palpable.      Auscultation: regular rate and rhythm, S1, S2 without murmurs, rubs, gallops, or clicks.    Vascular    Abdominal Aorta: no palpable masses, pulsations, or audible bruits.      Femoral Pulses: normal femoral pulses bilaterally.      Pedal Pulses: normal pedal pulses bilaterally.      Radial Pulses: normal radial pulses bilaterally.  Peripheral Circulation: no clubbing, cyanosis, or edema noted with normal capillary refill.     Impression & Recommendations:  Problem # 1:  UNSPECIFIED HEART FAILURE (ICD-428.9) The patient will have her carvedilol increased to 6.25 mg b.i.d. I will continue to titrate this and the lisinopril and followup with an echo after we have reached target or maximal drug doses. She continues to have class I symptoms.  Problem # 2:  PALPITATIONS (ICD-785.1) She is no longer complaining of this. No further  cardiovascular testing is suggested.  Patient Instructions: 1)  Your physician recommends that you schedule a follow-up appointment in: 1 month with Dr Antoine Poche  10/26/10 at 10:15 am 2)  Your physician has recommended you make the following change in your medication: increase carvedilol to 6.25 mg one twice a day Prescriptions: CARVEDILOL 6.25 MG TABS (CARVEDILOL) one twice a day  #60 x 3   Entered by:   Charolotte Capuchin, RN   Authorized by:   Rollene Rotunda, MD, [facility]   Signed by:   Charolotte Capuchin, RN on 09/23/2010   Method used:   [facility]  [address] [id number]       [address]       [place], [place]  [address]       Ph: [phone] or [phone]       Fax: [phone]   RxID:   [id number]

## 2011-01-20 NOTE — Letter (Signed)
Summary: [facility] Instructions  [facility]  [address] [place], [place] [address]   Phone: [phone]  Fax: [phone]       Carol Lawrence    12/03/1936    MRN: [id number]        Procedure Day Dorna Bloom: Wednesday 01/05/2011     Arrival Time: 8:30AM     Procedure Time: 9:30AM     Location of Procedure:                    _ x_  [facility] (4th Floor)                       PREPARATION FOR COLONOSCOPY WITH MOVIPREP   Starting 5 days prior to your procedure 12/31/2010 do not eat nuts, seeds, popcorn, corn, beans, peas,  salads, or any raw vegetables.  Do not take any fiber supplements (e.g. Metamucil, Citrucel, and Benefiber).  THE DAY BEFORE YOUR PROCEDURE         DATE: 01/04/2011    DAY: Tuesday  1.  Drink clear liquids the entire day-NO SOLID FOOD  2.  Do not drink anything colored red or purple.  Avoid juices with pulp.  No orange juice.  3.  Drink at least 64 oz. (8 glasses) of fluid/clear liquids during the day to prevent dehydration and help the prep work efficiently.  CLEAR LIQUIDS INCLUDE: Water Jello Ice Popsicles Tea (sugar ok, no milk/cream) Powdered fruit flavored drinks Coffee (sugar ok, no milk/cream) Gatorade Juice: apple, white grape, white cranberry  Lemonade Clear bullion, consomm, broth Carbonated beverages (any kind) Strained chicken noodle soup Hard Candy                             4.  In the morning, mix first dose of MoviPrep solution:    Empty 1 Pouch A and 1 Pouch B into the disposable container    Add lukewarm drinking water to the top line of the container. Mix to dissolve    Refrigerate (mixed solution should be used within 24 hrs)  5.  Begin drinking the prep at 5:00 p.m. The MoviPrep container is divided by 4 marks.   Every 15 minutes drink the solution down to the next mark (approximately 8 oz) until the full liter is complete.   6.  Follow completed prep with 16 oz of clear liquid of your choice  (Nothing red or purple).  Continue to drink clear liquids until bedtime.  7.  Before going to bed, mix second dose of MoviPrep solution:    Empty 1 Pouch A and 1 Pouch B into the disposable container    Add lukewarm drinking water to the top line of the container. Mix to dissolve    Refrigerate  THE DAY OF YOUR PROCEDURE      DATE: 01/05/2011   DAY: Wednesday  Beginning at 4:30AM (5 hours before procedure):         1. Every 15 minutes, drink the solution down to the next mark (approx 8 oz) until the full liter is complete.  2. Follow completed prep with 16 oz. of clear liquid of your choice.    3. You may drink clear liquids until 7:30AM (2 HOURS BEFORE PROCEDURE).   MEDICATION INSTRUCTIONS  Unless otherwise instructed, you should take regular prescription medications with a small sip of water   as early as possible the morning of your  procedure.    Additional medication instructions:  Hold Furosemide morning of procedure only         OTHER INSTRUCTIONS  You will need a responsible adult at least 73 years of age to accompany you and drive you home.   This person must remain in the waiting room during your procedure.  Wear loose fitting clothing that is easily removed.  Leave jewelry and other valuables at home.  However, you may wish to bring a book to read or  an iPod/MP3 player to listen to music as you wait for your procedure to start.  Remove all body piercing jewelry and leave at home.  Total time from sign-in until discharge is approximately 2-3 hours.  You should go home directly after your procedure and rest.  You can resume normal activities the  day after your procedure.  The day of your procedure you should not:   Drive   Make legal decisions   Operate machinery   Drink alcohol   Return to work  You will receive specific instructions about eating, activities and medications before you leave.    The above instructions have been reviewed and  explained to me by   Sherren Kerns RN  December 24, 2010 1:58 PM    I fully understand and can verbalize these instructions _____________________________ Date _________

## 2011-01-20 NOTE — Progress Notes (Signed)
Summary: Prep ?s  Phone Note Call from Patient Call back at Home Phone [phone]   Caller: Patient Call For: Dr.Kaislee Chao Reason for Call: Talk to Nurse Summary of Call: Pt has questions about what she can eat the day before her procedure Initial call taken by: [place] Johnson,  January 04, 2011 3:14 PM  Follow-up for Phone Call        answered questions regarding popsicles Follow-up by: Alease Frame RN,  January 04, 2011 4:34 PM

## 2011-01-20 NOTE — Miscellaneous (Signed)
Summary: rx for carvedilol 3.125 bid  Clinical Lists Changes  Medications: Added new medication of CARVEDILOL 3.125 MG TABS (CARVEDILOL) one twice a day - Signed Rx of CARVEDILOL 3.125 MG TABS (CARVEDILOL) one twice a day;  #60 x 6;  Signed;  Entered by: Charolotte Capuchin, RN;  Authorized by: Rollene Rotunda, MD, [facility];  Method used: [facility]  [address] [id number] [address], [place], [place]  [address], Ph: [phone] or [phone], Fax: [phone] Observations: Added new observation of PI CARDIO: Your physician recommends that you schedule a follow-up appointment in: 1 month with Dr Antoine Poche Your physician has recommended you make the following change in your medication: Start carvedilol 3.125 mg one twice a day (08/20/2010 11:06)    Prescriptions: CARVEDILOL 3.125 MG TABS (CARVEDILOL) one twice a day  #60 x 6   Entered by:   Charolotte Capuchin, RN   Authorized by:   Rollene Rotunda, MD, [facility]   Signed by:   Charolotte Capuchin, RN on 08/20/2010   Method used:   [facility]        [facility]  [address] [id number]       [address]       [place], [place]  [address]       Ph: [phone] or [phone]       Fax: [phone]   RxID:   [id number]    Patient Instructions: 1)  Your physician recommends that you schedule a follow-up appointment in: 1 month with Dr Antoine Poche 2)  Your physician has recommended you make the following change in your medication: Start carvedilol 3.125 mg one twice a day

## 2011-01-20 NOTE — Assessment & Plan Note (Signed)
Summary: PER CHECK OUT/SF  Medications Added LISINOPRIL 10 MG TABS (LISINOPRIL) one daily      Allergies Added:   Visit Type:  Follow-up Primary Provider:  Corwin Levins MD  CC:  Cardiomyopathy.  History of Present Illness: The patient presents for followup of a nonischemic cardiomyopathy. She reports no new symptoms. She has been feeling well since her last catheterization. I reviewed some labs done last month and she had a normal BUN, creatinine and potassium. Her BNP which had been markedly elevated was down to 174. She denies any shortness of breath, PND or orthopnea. She denies any palpitations, presyncope or syncope. She has had no chest pressure, neck or arm discomfort. She has done some gardening and walking without difficulty.  Of note I reviewed records from her GYN and see that she was on ifosfamide and Adriamycin in the past which possibly could have contributed to this cardiomyopathy.  Current Medications (verified): 1)  Adult Aspirin Low Strength 81 Mg  Tbdp (Aspirin) 2)  Daily Vitamins   Tabs (Multiple Vitamin) 3)  Proair Hfa 108 (90 Base) Mcg/act Aers (Albuterol Sulfate) .... 2 Puffs Qid As Needed 4)  Vitamin D + Calcium 5)  Furosemide 40 Mg Tabs (Furosemide) .Marland Kitchen.. 1 By Mouth Once Daily 6)  Lisinopril 5 Mg Tabs (Lisinopril) .... One Daily 7)  Pravastatin Sodium 80 Mg Tabs (Pravastatin Sodium) .... One At Bedtime  Allergies (verified): 1)  ! Codeine 2)  ! * Bee Stings  Past History:  Past Medical History: Colonic polyps, hx of Hyperlipidemia Hypertension, borderline Allergic rhinitis H/O Glucose Intolerance H/O Bells Palsy H/O Uterine Leimyosarcoma H/O DVT - lle 2000 Peripheral vascular disease - mild bilat carotid Cardiomyopathy (nonischemic)  Past Surgical History: Reviewed history from 10/23/2007 and no changes required. Lumbar [phone] Hysterectomy Oophorectomy s/p IVC filter  Review of Systems       As stated in the HPI and negative for all  other systems.   Vital Signs:  Patient profile:   75 year old female Height:      65 inches Weight:      131 pounds BMI:     21.88 Pulse rate:   67 / minute Resp:     18 per minute BP sitting:   146 / 70  (right arm)  Vitals Entered By: Marrion Coy, CNA (June 24, 2010 10:05 AM)  Physical Exam  General:  Well developed, well nourished, in no acute distress. Head:  normocephalic and atraumatic Mouth:  Teeth, gums and palate normal. Oral mucosa normal. Neck:  Neck supple, no JVD. No masses, thyromegaly or abnormal cervical nodes. Chest Wall:  no deformities or breast masses noted Lungs:  Clear bilaterally to auscultation and percussion. Abdomen:  Bowel sounds positive; abdomen soft and non-tender without masses, organomegaly, or hernias noted. No hepatosplenomegaly. Msk:  Back normal, normal gait. Muscle strength and tone normal. Extremities:  No clubbing or cyanosis. Neurologic:  Alert and oriented x 3. Skin:  Intact without lesions or rashes. Cervical Nodes:  no significant adenopathy Axillary Nodes:  no significant adenopathy Inguinal Nodes:  no significant adenopathy Psych:  Normal affect.   Detailed Cardiovascular Exam  Neck    Carotids: Carotids full and equal bilaterally without bruits.      Neck Veins: Normal, no JVD.    Heart    Inspection: no deformities or lifts noted.      Palpation: normal PMI with no thrills palpable.      Auscultation: regular rate and rhythm, S1, S2  without murmurs, rubs, gallops, or clicks.    Vascular    Abdominal Aorta: no palpable masses, pulsations, or audible bruits.      Femoral Pulses: normal femoral pulses bilaterally.      Pedal Pulses: normal pedal pulses bilaterally.      Radial Pulses: normal radial pulses bilaterally.      Peripheral Circulation: no clubbing, cyanosis, or edema noted with normal capillary refill.     EKG  Procedure date:  06/24/2010  Findings:      Sinus rhythm, rate 67, axis within normal limits,  intervals within normal limits, nonspecific inferior ST depression  Impression & Recommendations:  Problem # 1:  UNSPECIFIED HEART FAILURE (ICD-428.9) The patient is asymptomatic. I will continue to titrate the ACE inhibitor to 10 mg of lisinopril and eventually start carvedilol. For now she will continue the current dose of Lasix though I hope to wean this back eventually. We discussed salt restriction and the rationale behind medical management. Orders: EKG w/ Interpretation (93000)  Problem # 2:  HYPERTENSION (ICD-401.9) Her blood pressure is slightly elevated but will be managed in the context of treating her cardiomyopathy.  Problem # 3:  PERIPHERAL VASCULAR DISEASE (ICD-443.9) The patient has had some mild carotid stenosis which we will follow with periodic Dopplers.  Patient Instructions: 1)  Your physician recommends that you schedule a follow-up appointment in: 1 month with Dr Antoine Poche 2)  Your physician has recommended you make the following change in your medication: Increase Lisinopril to 10 mg a day Prescriptions: LISINOPRIL 10 MG TABS (LISINOPRIL) one daily  #90 x 3   Entered by:   Charolotte Capuchin, RN   Authorized by:   Rollene Rotunda, MD, [facility]   Signed by:   Charolotte Capuchin, RN on 06/24/2010   Method used:   [facility] to        [facility]  [address] [id number]       [address]       [place], [place]  [address]       Ph: [phone] or [phone]       Fax: [phone]   RxID:   [id number]  I have reviewed and approved all prescriptions at the time of this visit. Rollene Rotunda, MD, [facility]  June 24, 2010 10:58 AM

## 2011-01-20 NOTE — Procedures (Signed)
Summary: Colonoscopy  Patient: Carol Lawrence Note: All result statuses are Final unless otherwise noted.  Tests: (1) Colonoscopy (COL)   COL Colonoscopy           DONE     [facility]     [address]     [place], [place]  [address]           COLONOSCOPY PROCEDURE REPORT           PATIENT:  Carol, Lawrence  MR#:  [id number]     BIRTHDATE:  01-02-1936, 74 yrs. old  GENDER:  female     ENDOSCOPIST:  Vania Rea. Jarold Motto, MD, [facility]     REF. BY:     PROCEDURE DATE:  01/05/2011     PROCEDURE:  Higher-risk screening colonoscopy G0105           ASA CLASS:  Class II     INDICATIONS:  history of pre-cancerous (adenomatous) colon polyps           MEDICATIONS:   Fentanyl 50 mcg IV, Versed 4 mg IV           DESCRIPTION OF PROCEDURE:   After the risks benefits and     alternatives of the procedure were thoroughly explained, informed     consent was obtained.  Digital rectal exam was performed and     revealed no abnormalities.   The LB 180AL [id number] endoscope was     introduced through the anus and advanced to the cecum, which was     identified by both the appendix and ileocecal valve, without     limitations.  The quality of the prep was excellent, using     MoviPrep.  The instrument was then slowly withdrawn as the colon     was fully examined.     <<PROCEDUREIMAGES>>           FINDINGS:  Scattered diverticula were found in the sigmoid to     descending colon segments.  No polyps or cancers were seen.  This     was otherwise a normal examination of the colon.   Retroflexed     views in the rectum revealed no abnormalities.    The scope was     then withdrawn from the patient and the procedure completed.           COMPLICATIONS:  None     ENDOSCOPIC IMPRESSION:     1) Diverticula, scattered in the sigmoid to descending colon     segments     2) No polyps or cancers     3) Otherwise normal examination     RECOMMENDATIONS:     1) high fiber diet     2) Repeat Colonoscopy in 5  years.     REPEAT EXAM:  No           ______________________________     Vania Rea. Jarold Motto, MD, [facility]           CC:           n.     eSIGNED:   Vania Rea. Vanette Noguchi at 01/05/2011 10:23 AM           Choung, Eber Jones, [id number]  Note: An exclamation mark (!) indicates a result that was not dispersed into the flowsheet. Document Creation Date: 01/05/2011 10:24 AM _______________________________________________________________________  (1) Order result status: Final Collection or observation date-time: 01/05/2011 10:19 Requested date-time:  Receipt date-time:  Reported date-time:  Referring Physician:  Ordering Physician: Sheryn Bison [phone]) Specimen Source:  Source: Launa Grill Order Number: [id number] Lab site:   Appended Document: Colonoscopy    Clinical Lists Changes  Observations: Added new observation of COLONNXTDUE: 12/2015 (01/05/2011 13:52)

## 2011-01-20 NOTE — Miscellaneous (Signed)
  Clinical Lists Changes  Observations: Added new observation of PFT RSLT: There is a minimal obstructive lung defect. The airway obstruction is confirmed by the decrease in flow rate at peak flow and flow at 50% and 75% of the flow volume curve. lung volumes are within normal limits. There is moderate decrease  in diffusing capacity FEF 25-75 changed by 45%. This is interpreted as a mild response to bronchodilator. (03/18/2010 15:14) Added new observation of ECHOINTERP:   - Left ventricle: The cavity size was mildly dilated. Wall thickness       was normal. The estimated ejection fraction was 15%. Diffuse       hypokinesis. Doppler parameters are consistent with an       irreversible restrictive pattern, indicative of decreased left       ventricular diastolic compliance and/or increased left atrial       pressure (grade 4 diastolic dysfunction).     - Aortic valve: Trivial regurgitation.     - Mitral valve: Mild regurgitation.     - Left atrium: The atrium was mildly dilated.     - Right ventricle: Systolic function was reduced.     - Right atrium: The atrium was mildly dilated.     - Atrial septum: The septum bowed from left to right, consistent       with increased left atrial pressure.     - Pulmonary arteries: Systolic pressure was moderately to severely       increased. PA peak pressure: 62mm Hg (S).     - Pericardium, extracardiac: A small pericardial effusion was       identified. (03/18/2010 14:43)      Echocardiogram  Procedure date:  03/18/2010  Findings:        - Left ventricle: The cavity size was mildly dilated. Wall thickness       was normal. The estimated ejection fraction was 15%. Diffuse       hypokinesis. Doppler parameters are consistent with an       irreversible restrictive pattern, indicative of decreased left       ventricular diastolic compliance and/or increased left atrial       pressure (grade 4 diastolic dysfunction).     - Aortic valve: Trivial  regurgitation.     - Mitral valve: Mild regurgitation.     - Left atrium: The atrium was mildly dilated.     - Right ventricle: Systolic function was reduced.     - Right atrium: The atrium was mildly dilated.     - Atrial septum: The septum bowed from left to right, consistent       with increased left atrial pressure.     - Pulmonary arteries: Systolic pressure was moderately to severely       increased. PA peak pressure: 62mm Hg (S).     - Pericardium, extracardiac: A small pericardial effusion was       identified.  PFT  Procedure date:  03/18/2010  Findings:      There is a minimal obstructive lung defect. The airway obstruction is confirmed by the decrease in flow rate at peak flow and flow at 50% and 75% of the flow volume curve. lung volumes are within normal limits. There is moderate decrease  in diffusing capacity FEF 25-75 changed by 45%. This is interpreted as a mild response to bronchodilator.

## 2011-01-20 NOTE — Assessment & Plan Note (Signed)
Summary: eph/jml    Visit Type:  Follow-up Primary Provider:  Corwin Levins MD  CC:  no complaints.  History of Present Illness: This is a 75 year old, white female patient was recently diagnosed with a cardiomyopathy, ejection fraction 15% on 2-D echo. She is was admitted for cardiac catheterization to rule out ischemic cardiomyopathy. This revealed nonobstructive minimal coronary plaque, with moderate cardiomyopathy, ejection fraction 25% with global hypokinesis.  The patient is feeling much better without dyspnea, dyspnea on exertion, chest pain, palpitations, dizziness, or presyncope. She worked in her yard all last week and just returned from a trip to [facility]. She is very diligent about watching her diet.  Current Medications (verified): 1)  Adult Aspirin Low Strength 81 Mg  Tbdp (Aspirin) 2)  Daily Vitamins   Tabs (Multiple Vitamin) 3)  Proair Hfa 108 (90 Base) Mcg/act Aers (Albuterol Sulfate) .... 2 Puffs Qid As Needed 4)  Vitamin D + Calcium 5)  Furosemide 40 Mg Tabs (Furosemide) .Marland Kitchen.. 1 By Mouth Once Daily 6)  Lisinopril 5 Mg Tabs (Lisinopril) .... One Daily 7)  Pravastatin Sodium 80 Mg Tabs (Pravastatin Sodium) .... One At Bedtime  Allergies: 1)  ! Codeine 2)  ! * Bee Stings  Past History:  Past Medical History: Last updated: 01/16/2009 Colonic polyps, hx of Hyperlipidemia Hypertension, borderline Allergic rhinitis H/O Glucose Intolerance H/O Bells Palsy H/O Uterine Leimyosarcoma H/O DVT - lle 2000 Peripheral vascular disease - mild bilat carotid  Social History: Reviewed history from 03/25/2010 and no changes required. Former Smoker (quit 30 years ago) Alcohol use-no Married 2 grown children Retired - Printmaker - underwristing  Review of Systems       see history of present illness  Vital Signs:  Patient profile:   75 year old female Height:      64.5 inches Weight:      131 pounds Pulse rate:   68 / minute Pulse rhythm:   regular BP  sitting:   120 / 70  (left arm)  Vitals Entered By: Jacquelin Hawking, CMA (Apr 19, 2010 10:28 AM)  Physical Exam  General:   Well-nournished, in no acute distress. Neck: No JVD, HJR, Bruit, or thyroid enlargement Lungs: No tachypnea, clear without wheezing, rales, or rhonchi Cardiovascular: RRR, PMI not displaced, heart sounds normal, no murmurs, gallops, bruit, thrill, or heave. Abdomen: BS normal. Soft without organomegaly, masses, lesions or tenderness. Extremities: right groin without hematoma or hemorrhage, without cyanosis, clubbing or edema. Good distal pulses bilateral SKin: Warm, no lesions or rashes  Musculoskeletal: No deformities Neuro: no focal signs    Impression & Recommendations:  Problem # 1:  UNSPECIFIED HEART FAILURE (ICD-428.9) Patient has a nonischemic cardiomyopathy, etiology unknown. Cardiac catheterization revealed nonobstructive minimal coronary plaque. Ejection fraction 25% with global hypokinesis. Patient is doing well on her current medications. I will not make any changes.  Problem # 2:  ELEVATED BLOOD PRESSURE WITHOUT DIAGNOSIS OF HYPERTENSION (ICD-796.2) Patient's blood pressure is well controlled.  Patient Instructions: 1)  Your physician recommends that you schedule a follow-up appointment in: 2 months with Dr. Antoine Poche.

## 2011-01-20 NOTE — Assessment & Plan Note (Signed)
Summary: 1 month rov 428.22 med titration   Visit Type:  Follow-up Primary Stoney Karczewski:  Corwin Levins MD  CC:  Cardiomyopathy.  History of Present Illness: The patient presents for followup of her nonischemic cardiomyopathy. Since I last saw her she has had no new cardiovascular problems. She tolerated increased dose of carvedilol to 6.25 mg b.i.d. She was a little lightheaded for one day but otherwise feels well. She is not having any new palpitations, presyncope or syncope. She is not having any new chest pressure, neck or arm discomfort. She is not having any new shortness of breath, PND or orthopnea.  Current Medications (verified): 1)  Adult Aspirin Low Strength 81 Mg  Tbdp (Aspirin) 2)  Daily Vitamins   Tabs (Multiple Vitamin) 3)  Proair Hfa 108 (90 Base) Mcg/act Aers (Albuterol Sulfate) .... 2 Puffs Qid As Needed 4)  Vitamin D + Calcium 5)  Furosemide 40 Mg Tabs (Furosemide) .Marland Kitchen.. 1 By Mouth Once Daily 6)  Lisinopril 10 Mg Tabs (Lisinopril) .... One Daily 7)  Pravastatin Sodium 80 Mg Tabs (Pravastatin Sodium) .... One At Bedtime 8)  Carvedilol 6.25 Mg Tabs (Carvedilol) .... One Twice A Day  Allergies (verified): 1)  ! Codeine 2)  ! * Bee Stings  Past History:  Past Medical History: Colonic polyps, hx of Hyperlipidemia Hypertension, borderline Allergic rhinitis Glucose Intolerance Bells Palsy Uterine Leimyosarcoma DVT - lle 2000 Peripheral vascular disease - mild bilat carotid Cardiomyopathy (nonischemic EF 25%)  Past Surgical History: Reviewed history from 10/23/2007 and no changes required. Lumbar [phone] Hysterectomy Oophorectomy s/p IVC filter  Review of Systems       As stated in the HPI and negative for all other systems.   Vital Signs:  Patient profile:   75 year old female Height:      65 inches Weight:      132 pounds BMI:     22.05 Pulse rate:   62 / minute Resp:     16 per minute BP sitting:   112 / 62  (right arm)  Vitals Entered By:  Marrion Coy, CNA (October 26, 2010 10:21 AM)  Physical Exam  General:  Well developed, well nourished, in no acute distress. Head:  normocephalic and atraumatic Neck:  Neck supple, no JVD. No masses, thyromegaly or abnormal cervical nodes. Chest Wall:  no deformities or breast masses noted Lungs:  Clear bilaterally to auscultation and percussion. Heart:  Non-displaced PMI, chest non-tender; regular rate and rhythm, S1, S2 without murmurs, rubs or gallops. Carotid upstroke normal, no bruit. Normal abdominal aortic size, no bruits. Femorals normal pulses, no bruits. Pedals normal pulses. No edema, no varicosities. Abdomen:  Bowel sounds positive; abdomen soft and non-tender without masses, organomegaly, or hernias noted. No hepatosplenomegaly. Msk:  Back normal, normal gait. Muscle strength and tone normal. Extremities:  No clubbing or cyanosis. Neurologic:  Alert and oriented x 3. Skin:  Intact without lesions or rashes. Cervical Nodes:  no significant adenopathy Psych:  Normal affect.   EKG  Procedure date:  10/26/2010  Findings:      Sinus rhythm, rate 60 to, poor anterior R-wave progression, lateral T wave inversions present on previous EKGs.  Impression & Recommendations:  Problem # 1:  CARDIOMYOPATHY (ICD-425.4) Today I will titrate her carvedilol to 9.375 mg b.i.d. We will continue to do this as her blood pressure tolerates and then followup with an echocardiogram in the future.  Problem # 2:  PALPITATIONS (ICD-785.1) She is having no symptoms. No further cardiovascular  testing is suggested. Orders: EKG w/ Interpretation (93000)  Patient Instructions: 1)  Your physician recommends that you schedule a follow-up appointment in: 2 MONTHS WITH DR [facility] 2)  Your physician has recommended you make the following change in your medication: INCREASE CARVEDILOL TO 6.25 MG ONE AND 1/2 TABLET TWICE A DAY Prescriptions: CARVEDILOL 6.25 MG TABS (CARVEDILOL) ONE AND 1/2 TABLET  TWICE A DAY  #90 x 3   Entered by:   Charolotte Capuchin, RN   Authorized by:   Rollene Rotunda, MD, [facility]   Signed by:   Charolotte Capuchin, RN on 10/26/2010   Method used:   [facility]  [address] [id number]       [address]       [place], [place]  [address]       Ph: [phone] or [phone]       Fax: [phone]   RxID:   [id number]  I have reviewed and approved all prescriptions at the time of this visit. Rollene Rotunda, MD, [facility]  October 26, 2010 11:17 AM

## 2011-01-20 NOTE — Miscellaneous (Signed)
Summary: Orders Update pft charges  Clinical Lists Changes  Orders: Added new Service order of Carbon Monoxide diffusing w/capacity (94720) - Signed Added new Service order of Lung Volumes (94240) - Signed Added new Service order of Spirometry (Pre & Post) (94060) - Signed 

## 2011-01-20 NOTE — Assessment & Plan Note (Signed)
Summary: ROV NO AVIAL APPT. IN AUG PT TOLD TO CALL IF SHE HAS ANY PROB...      Allergies Added:   Visit Type:  Follow-up Primary Provider:  Corwin Levins MD  CC:  Cardiomyopathy.  History of Present Illness: The patient presents for followup of her cardiomyopathy nonischemic. At the last appointment I increased her lisinopril to 10 mg daily. She tolerated this without lightheadedness, presyncope or syncope. She has had some rare fluttering in her chest. Twice she's had episodes where she thought her heart was perhaps irregular. One was about 6 weeks ago and the other was in the last week or so. The second event lasted for 2 hours. She did not have presyncope or syncope. She has not had any chest pressure, neck or arm discomfort. She has had no shortness of breath, PND or orthopnea. She has had no weight gain or edema.  Current Medications (verified): 1)  Adult Aspirin Low Strength 81 Mg  Tbdp (Aspirin) 2)  Daily Vitamins   Tabs (Multiple Vitamin) 3)  Proair Hfa 108 (90 Base) Mcg/act Aers (Albuterol Sulfate) .... 2 Puffs Qid As Needed 4)  Vitamin D + Calcium 5)  Furosemide 40 Mg Tabs (Furosemide) .Marland Kitchen.. 1 By Mouth Once Daily 6)  Lisinopril 10 Mg Tabs (Lisinopril) .... One Daily 7)  Pravastatin Sodium 80 Mg Tabs (Pravastatin Sodium) .... One At Bedtime  Allergies (verified): 1)  ! Codeine 2)  ! * Bee Stings  Past History:  Past Medical History: Colonic polyps, hx of Hyperlipidemia Hypertension, borderline Allergic rhinitis H/O Glucose Intolerance H/O Bells Palsy H/O Uterine Leimyosarcoma H/O DVT - lle 2000 Peripheral vascular disease - mild bilat carotid Cardiomyopathy (nonischemic EF 25%)  Past Surgical History: Reviewed history from 10/23/2007 and no changes required. Lumbar [phone] Hysterectomy Oophorectomy s/p IVC filter  Review of Systems       As stated in the HPI and negative for all other systems.   Vital Signs:  Patient profile:   75 year old  female Height:      65 inches Weight:      133 pounds BMI:     22.21 Pulse rate:   75 / minute Resp:     16 per minute BP sitting:   134 / 69  (right arm)  Vitals Entered By: Marrion Coy, CNA (August 20, 2010 10:38 AM)  Physical Exam  General:  Well developed, well nourished, in no acute distress. Head:  normocephalic and atraumatic Eyes:  PERRLA/EOM intact; conjunctiva and lids normal. Mouth:  Teeth, gums and palate normal. Oral mucosa normal. Neck:  Neck supple, no JVD. No masses, thyromegaly or abnormal cervical nodes. Chest Wall:  no deformities or breast masses noted Lungs:  Clear bilaterally to auscultation and percussion. Abdomen:  Bowel sounds positive; abdomen soft and non-tender without masses, organomegaly, or hernias noted. No hepatosplenomegaly. Msk:  Back normal, normal gait. Muscle strength and tone normal. Extremities:  No clubbing or cyanosis. Neurologic:  Alert and oriented x 3. Skin:  Intact without lesions or rashes. Cervical Nodes:  no significant adenopathy Axillary Nodes:  no significant adenopathy Inguinal Nodes:  no significant adenopathy Psych:  Normal affect.   Detailed Cardiovascular Exam  Neck    Carotids: Carotids full and equal bilaterally without bruits.      Neck Veins: Normal, no JVD.    Heart    Inspection: no deformities or lifts noted.      Palpation: normal PMI with no thrills palpable.  Auscultation: regular rate and rhythm, S1, S2 without murmurs, rubs, gallops, or clicks.    Vascular    Abdominal Aorta: no palpable masses, pulsations, or audible bruits.      Femoral Pulses: normal femoral pulses bilaterally.      Pedal Pulses: normal pedal pulses bilaterally.      Radial Pulses: normal radial pulses bilaterally.      Peripheral Circulation: no clubbing, cyanosis, or edema noted with normal capillary refill.     Impression & Recommendations:  Problem # 1:  UNSPECIFIED HEART FAILURE (ICD-428.9) Today I will add  carvedilol 3.125 mg b.i.d. I will continue to titrate this and her ACE inhibitor and follow up with an echocardiogram in the future.  Problem # 2:  PALPITATIONS (ICD-785.1) She has had tachycardia palpitations as described but there have only been 2 episodes. I told her if this occurs again to call EMS so we can get a recording period if it happens with any frequency I might also apply an event monitor.  Problem # 3:  HYPERTENSION (ICD-401.9) Her blood pressure is slightly elevated will be treated in the context of managing her cardiomyopathy.

## 2011-01-20 NOTE — Progress Notes (Signed)
----   Converted from flag ---- ---- 03/01/2010 10:55 AM, Edman Circle wrote: appt 3/31 @ 2:00  ---- 03/01/2010 8:13 AM, Dagoberto Reef wrote: Thanks  ---- 02/26/2010 3:48 PM, Corwin Levins MD wrote: The following orders have been entered for this patient and placed on Admin Hold:  Type:     Referral       Code:   Echo Description:   Echo Referral Order Date:   02/26/2010   Authorized By:   Corwin Levins MD Order #:   [id number] Clinical Notes:   Type:  Special instructions: ------------------------------

## 2011-01-20 NOTE — Assessment & Plan Note (Signed)
Summary: no fever/non productive cough/cd   Vital Signs:  Patient profile:   75 year old female Height:      64.75 inches Weight:      139.50 pounds BMI:     23.48 O2 Sat:      94 % on Room air Temp:     97.0 degrees F oral Pulse rate:   101 / minute BP sitting:   150 / 72  (left arm)  Vitals Entered By: Lucious Groves (February 26, 2010 3:10 PM)  O2 Flow:  Room air CC: C/O non-productive cough x2 months, and some SOB. Pt denies having a cold/fever./kb Is Patient Diabetic? No Pain Assessment Patient in pain? no        CC:  C/O non-productive cough x2 months and and some SOB. Pt denies having a cold/fever./kb.  History of Present Illness: here with increased stress over multple family members with severe illness recent , and states she does not feel "sick" but has had unusual sob/doe for 2 months, with more recent onset wheezing that tends to wake her up at night (but not clear if orhopneic or pnd) and even more recent wiht onset cough, again wtihout fever or productivity, ST , headache or malaise.  Overall confusing to her b/c she seems to be able to perform recent yardwork and can walk steadily on treadmill for excericse without seemingly difficulty.  Pt denies CP, orthopnea, pnd, worsening LE edema, palps, dizziness or syncope .  Pt denies new neuro symptoms such as headache, facial or extremity weakness   No prior hx of same in the past.    Problems Prior to Update: 1)  Uri  (ICD-465.9) 2)  Dyspnea  (ICD-786.05) 3)  Bite of Other Animal Except Arthropod  (ICD-E906.3) 4)  Elevated Blood Pressure Without Diagnosis of Hypertension  (ICD-796.2) 5)  Peripheral Vascular Disease  (ICD-443.9) 6)  Preventive Health Care  (ICD-V70.0) 7)  Asthmatic Bronchitis, Acute  (ICD-466.0) 8)  Abdominal Pain, Left Upper Quadrant  (ICD-789.02) 9)  Preventive Health Care  (ICD-V70.0) 10)  Family History of Cad Female 1st Degree Relative <60  (ICD-V16.49) 11)  Family History of Cad Female 1st Degree  Relative <50  (ICD-V17.3) 12)  Leiomyoma, Uterus  (ICD-218.9) 13)  Dvt  (ICD-453.40) 14)  Bell's Palsy, Hx of  (ICD-V12.49) 15)  Glucose Intolerance, Hx of  (ICD-V12.2) 16)  Allergic Rhinitis  (ICD-477.9) 17)  Hypertension  (ICD-401.9) 18)  Hyperlipidemia  (ICD-272.4) 19)  Colonic Polyps, Hx of  (ICD-V12.72)  Medications Prior to Update: 1)  Lipitor 80 Mg Tabs (Atorvastatin Calcium) .... Take 1 Tablet By Mouth Once A Day 2)  Adult Aspirin Low Strength 81 Mg  Tbdp (Aspirin) 3)  Daily Vitamins   Tabs (Multiple Vitamin) 4)  Proair Hfa 108 (90 Base) Mcg/act Aers (Albuterol Sulfate) .... 2 Puffs Qid As Needed 5)  Vitamin D + Calcium 6)  Augmentin 875-125 Mg Tabs (Amoxicillin-Pot Clavulanate) .Marland Kitchen.. 1 By Mouth Two Times A Day  Current Medications (verified): 1)  Lipitor 80 Mg Tabs (Atorvastatin Calcium) .... Take 1 Tablet By Mouth Once A Day 2)  Adult Aspirin Low Strength 81 Mg  Tbdp (Aspirin) 3)  Daily Vitamins   Tabs (Multiple Vitamin) 4)  Proair Hfa 108 (90 Base) Mcg/act Aers (Albuterol Sulfate) .... 2 Puffs Qid As Needed 5)  Vitamin D + Calcium 6)  Dulera 100-5 Mcg/act Aero (Mometasone Furo-Formoterol Fum) .... 2 Puffs Two Times A Day  Allergies (verified): 1)  ! Codeine 2)  ! 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 sister with CLL sister with  acute leukemia Family History of CAD Female 1st degree relative <50 Family History of CAD Female 1st degree relative <60 Family History High cholesterol Family History Lung cancer mother with ostoeoporosis and COPD 2 brother died with lung cancer/smokers 1 sister died with melanoma 1 sister died with liver cancer  Review of Systems       all otherwise negative per pt -   Physical Exam  General:  alert and well-developed.  , not ill appearing Head:  normocephalic and atraumatic.   Eyes:  vision grossly intact, pupils equal, and pupils round.   Ears:  bilat tm's mild erythemak, sinus nontender Nose:  nasal dischargemucosal pallor and mucosal edema.   Mouth:  pharyngeal erythema and fair dentition.   Neck:  supple, no masses, and no JVD.   Lungs:  normal respiratory effort and normal breath sounds.   Heart:  normal rate and regular rhythm.   Abdomen:  soft, non-tender, and normal bowel sounds.   Msk:  no joint tenderness and no joint swelling.   Extremities:  no edema, no erythema  Neurologic:  cranial nerves II-XII intact and strength normal in all extremities.   Psych:  not depressed appearing and moderately anxious.     Impression & Recommendations:  Problem # 1:  DYSPNEA (ICD-786.05)  no CP but with signficant cough and intermittent wheeze as above; ; unclear etiology - diff includes anxiety, asthma, copd, pulm fibrosis, CHF -  for ecg, cxr, labs today; and echo; and trial proair hfa , and dulera; f/u as planned (has CPX in approx 2 wks)  Orders: EKG w/ Interpretation (93000) Echo Referral (Echo) TLB-BNP (B-Natriuretic Peptide) (83880-BNPR) T-2 View CXR, Same Day (71020.5TC)  Problem # 2:  URI (ICD-465.9)  Her updated medication list for this problem includes:    Adult Aspirin Low Strength 81 Mg Tbdp (Aspirin) essentially asymmpt but has physcial evidence,  prob viral , mild and recent , not liekly assoc with #1 except may account for cough?  Problem # 3:   HYPERTENSION (ICD-401.9)  BP today: 150/72 Prior BP: 136/70 (08/27/2009)  Labs Reviewed: K+: 4.1 (01/16/2009) Creat: : 0.8 (01/16/2009)   Chol: 108 (01/16/2009)   HDL: 28.0 (01/16/2009)   LDL: 60 (01/16/2009)   TG: 100 (01/16/2009) mild elev today, likely situational, ok to follow, continue same treatment   Complete Medication List: 1)  Lipitor 80 Mg Tabs (Atorvastatin calcium) .... Take 1 tablet by mouth once a day 2)  Adult Aspirin Low Strength 81 Mg Tbdp (Aspirin) 3)  Daily Vitamins Tabs (Multiple vitamin) 4)  Proair Hfa 108 (90 Base) Mcg/act Aers (Albuterol sulfate) .... 2 puffs qid as needed 5)  Vitamin D + Calcium  6)  Dulera 100-5 Mcg/act Aero (Mometasone furo-formoterol fum) .... 2 puffs two times a day  Other Orders: T-Vitamin D (25-Hydroxy) (09811-91478) TLB-BMP (Basic Metabolic Panel-BMET) (80048-METABOL) TLB-CBC Platelet - w/Differential (85025-CBCD) TLB-Hepatic/Liver Function Pnl (80076-HEPATIC) TLB-Lipid Panel (80061-LIPID)  TLB-TSH (Thyroid Stimulating Hormone) (84443-TSH) TLB-Udip ONLY (81003-UDIP)  Patient Instructions: 1)  Your EKG today as OK 2)  Please go to the Lab in the basement for your blood and/or urine tests today  3)  Please go to Radiology in the basement level for your X-Ray today  4)  You will be contacted about the referral(s) to: Echocardiogram 5)  Please try the Dulera at 2 puffs twice per day until return (and remember to rinse the mouth after each use to avoid thrush) 6)  Please schedule a follow-up appointment as planned

## 2011-01-20 NOTE — Assessment & Plan Note (Signed)
Summary: yearly---stc   Vital Signs:  Patient profile:   75 year old female Height:      64.5 inches Weight:      136 pounds BMI:     23.07 O2 Sat:      97 % on Room air Temp:     97 degrees F oral Pulse rate:   81 / minute BP sitting:   132 / 78  (left arm) Cuff size:   regular  Vitals Entered ByMarland Kitchen Zella Ball Ewing (March 11, 2010 11:17 AM)  O2 Flow:  Room air CC: Adult Physical/RE   CC:  Adult Physical/RE.  History of Present Illness: wt down from 139 to 136;  meds previous did not help, lasix helped the sob and cough for 2 days, but now seems to be coming back although having sinus allergy symtpoms as well;  no fever, ST, and Pt denies CP,  wheezing, orthopnea, pnd, worsening LE edema, palps, dizziness or syncope .  Pt denies new neuro symptoms such as headache, facial or extremity weakness   Problems Prior to Update: 1)  Unspecified Heart Failure  (ICD-428.9) 2)  Uri  (ICD-465.9) 3)  Dyspnea  (ICD-786.05) 4)  Bite of Other Animal Except Arthropod  (ICD-E906.3) 5)  Elevated Blood Pressure Without Diagnosis of Hypertension  (ICD-796.2) 6)  Peripheral Vascular Disease  (ICD-443.9) 7)  Preventive Health Care  (ICD-V70.0) 8)  Asthmatic Bronchitis, Acute  (ICD-466.0) 9)  Abdominal Pain, Left Upper Quadrant  (ICD-789.02) 10)  Preventive Health Care  (ICD-V70.0) 11)  Family History of Cad Female 1st Degree Relative <60  (ICD-V16.49) 12)  Family History of Cad Female 1st Degree Relative <50  (ICD-V17.3) 13)  Leiomyoma, Uterus  (ICD-218.9) 14)  Dvt  (ICD-453.40) 15)  Bell's Palsy, Hx of  (ICD-V12.49) 16)  Glucose Intolerance, Hx of  (ICD-V12.2) 17)  Allergic Rhinitis  (ICD-477.9) 18)  Hypertension  (ICD-401.9) 19)  Hyperlipidemia  (ICD-272.4) 20)  Colonic Polyps, Hx of  (ICD-V12.72)  Medications Prior to Update: 1)  Lipitor 80 Mg Tabs (Atorvastatin Calcium) .... Take 1 Tablet By Mouth Once A Day 2)  Adult Aspirin Low Strength 81 Mg  Tbdp (Aspirin) 3)  Daily Vitamins   Tabs  (Multiple Vitamin) 4)  Proair Hfa 108 (90 Base) Mcg/act Aers (Albuterol Sulfate) .... 2 Puffs Qid As Needed 5)  Vitamin D + Calcium 6)  Dulera 100-5 Mcg/act Aero (Mometasone Furo-Formoterol Fum) .... 2 Puffs Two Times A Day 7)  Furosemide 20 Mg Tabs (Furosemide) .Marland Kitchen.. 1 By Mouth Once Daily  Current Medications (verified): 1)  Lipitor 80 Mg Tabs (Atorvastatin Calcium) .... Take 1 Tablet By Mouth Once A Day 2)  Adult Aspirin Low Strength 81 Mg  Tbdp (Aspirin) 3)  Daily Vitamins   Tabs (Multiple Vitamin) 4)  Proair Hfa 108 (90 Base) Mcg/act Aers (Albuterol Sulfate) .... 2 Puffs Qid As Needed 5)  Vitamin D + Calcium 6)  Dulera 100-5 Mcg/act Aero (Mometasone Furo-Formoterol Fum) .... 2 Puffs Two Times A Day 7)  Furosemide 40 Mg Tabs (Furosemide) .Marland Kitchen.. 1 By Mouth Once Daily  Allergies (verified): 1)  ! Codeine 2)  ! 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   Head:  normocephalic and atraumatic.   Eyes:  vision grossly intact, pupils equal, and pupils round.   Ears:  R ear normal and L ear normal.   Nose:  no external deformity and no nasal discharge.   Mouth:  pharyngeal erythema and fair dentition.   Neck:  supple and no masses.   Lungs:  normal respiratory effort and normal breath sounds.   Heart:  normal rate  and regular rhythm. , distant  Extremities:  no edema, no erythema    Impression & Recommendations:  Problem # 1:  UNSPECIFIED HEART FAILURE (ICD-428.9)  Her updated medication list for this problem includes:    Adult Aspirin Low Strength 81 Mg Tbdp (Aspirin)    Furosemide 40 Mg Tabs (Furosemide) .Marland Kitchen... 1 by mouth once daily to increase the lasix 40 once daily ,  f/u echo, refer cardiology, f/u echo , check f/u BNP  Orders: Cardiology Referral (Cardiology) TLB-BNP (B-Natriuretic Peptide) (83880-BNPR)  Problem # 2:  DYSPNEA (ICD-786.05)  also check pft's  Orders: Misc. Referral (Misc. Ref)  Problem # 3:  ELEVATED BLOOD PRESSURE WITHOUT DIAGNOSIS OF HYPERTENSION (ICD-796.2)  Her updated medication list for this problem includes:    Furosemide 40 Mg Tabs (Furosemide) .Marland Kitchen... 1 by mouth once daily  BP today: 132/78 Prior BP: 150/72 (02/26/2010)  Labs Reviewed: Creat: 0.8 (02/26/2010) Chol: 133 (02/26/2010)   HDL: 44.00 (02/26/2010)   LDL: 64 (02/26/2010)   TG: 124.0 (02/26/2010)  improved, Continue all previous medications as before this visit   Complete Medication List: 1)  Lipitor 80 Mg Tabs (Atorvastatin calcium) .... Take 1 tablet by mouth once a day 2)  Adult Aspirin Low Strength 81 Mg Tbdp (Aspirin) 3)  Daily Vitamins Tabs (Multiple vitamin) 4)  Proair Hfa 108 (90 Base) Mcg/act Aers (Albuterol sulfate) .... 2 puffs qid as needed 5)  Vitamin D + Calcium  6)  Dulera 100-5 Mcg/act Aero (Mometasone furo-formoterol fum) .... 2 puffs two times a day 7)  Furosemide 40 Mg Tabs (Furosemide) .Marland Kitchen.. 1 by mouth once daily  Patient Instructions: 1)  Please go to the Lab in the basement for your blood and/or urine tests today 2)  You will be contacted about the referral(s) to: cardiology, and the lung testing 3)  increase the lasix generic to 40 mg per day 4)  Continue all previous medications as before this visit  5)  Please schedule a follow-up appointment in 3 months or sooner  if needed Prescriptions: FUROSEMIDE 40 MG TABS (FUROSEMIDE) 1 by mouth once daily  #90 x 3   Entered and Authorized by:   Corwin Levins MD   Signed by:   Corwin Levins MD on 03/11/2010   Method used:   Electronically to        [facility]  [address] [id number] ([facility])       [address]       [place], [place]  [address]       Ph: [phone] or [phone]       Fax: [phone]   RxID:   [id number]

## 2011-01-20 NOTE — Assessment & Plan Note (Signed)
Summary: per check out/sf   Visit Type:  Follow-up Primary Provider:  Corwin Levins MD  CC:  no complaints.  History of Present Illness: The patient presents for followup of her cardiomyopathy. At the last visit I increased her carvedilol to 9.375 mg b.i.d. Since that time she has been well. She had no symptoms associated with this. She has not had any palpitations, presyncope or syncope. She has had no new shortness of breath. She denies any chest pressure, neck or arm discomfort.  Problems Prior to Update: 1)  Cardiomyopathy  (ICD-425.4) 2)  Palpitations  (ICD-785.1) 3)  Preventive Health Care  (ICD-V70.0) 4)  Unspecified Heart Failure  (ICD-428.9) 5)  Uri  (ICD-465.9) 6)  Dyspnea  (ICD-786.05) 7)  Bite of Other Animal Except Arthropod  (ICD-E906.3) 8)  Elevated Blood Pressure Without Diagnosis of Hypertension  (ICD-796.2) 9)  Peripheral Vascular Disease  (ICD-443.9) 10)  Preventive Health Care  (ICD-V70.0) 11)  Asthmatic Bronchitis, Acute  (ICD-466.0) 12)  Abdominal Pain, Left Upper Quadrant  (ICD-789.02) 13)  Preventive Health Care  (ICD-V70.0) 14)  Family History of Cad Female 1st Degree Relative <60  (ICD-V16.49) 15)  Family History of Cad Female 1st Degree Relative <50  (ICD-V17.3) 16)  Leiomyoma, Uterus  (ICD-218.9) 17)  Dvt  (ICD-453.40) 18)  Bell's Palsy, Hx of  (ICD-V12.49) 19)  Glucose Intolerance, Hx of  (ICD-V12.2) 20)  Allergic Rhinitis  (ICD-477.9) 21)  Hypertension  (ICD-401.9) 22)  Hyperlipidemia  (ICD-272.4) 23)  Colonic Polyps, Hx of  (ICD-V12.72)  Current Medications (verified): 1)  Adult Aspirin Low Strength 81 Mg  Tbdp (Aspirin) 2)  Daily Vitamins   Tabs (Multiple Vitamin) 3)  Proair Hfa 108 (90 Base) Mcg/act Aers (Albuterol Sulfate) .... 2 Puffs Qid As Needed 4)  Vitamin D + Calcium 5)  Furosemide 40 Mg Tabs (Furosemide) .Marland Kitchen.. 1 By Mouth Once Daily 6)  Lisinopril 10 Mg Tabs (Lisinopril) .... One Daily 7)  Pravastatin Sodium 80 Mg Tabs (Pravastatin  Sodium) .... One At Bedtime 8)  Carvedilol 12.5 Mg Tabs (Carvedilol) .... One Twice A Day  Allergies (verified): 1)  ! Codeine 2)  ! * Bee Stings  Past History:  Past Medical History: Reviewed history from 10/26/2010 and no changes required. Colonic polyps, hx of Hyperlipidemia Hypertension, borderline Allergic rhinitis Glucose Intolerance Bells Palsy Uterine Leimyosarcoma DVT - lle 2000 Peripheral vascular disease - mild bilat carotid Cardiomyopathy (nonischemic EF 25%)  Past Surgical History: Reviewed history from 10/23/2007 and no changes required. Lumbar [phone] Hysterectomy Oophorectomy s/p IVC filter  Review of Systems       .As stated in the HPI and negative for all other systems.   Vital Signs:  Patient profile:   75 year old female Height:      65 inches Weight:      134 pounds BMI:     22.38 Pulse rate:   60 / minute BP sitting:   142 / 60  (left arm) Cuff size:   regular  Vitals Entered By: Rollene Rotunda, MD, [facility] (December 23, 2010 2:54 PM)  Physical Exam  General:  Well developed, well nourished, in no acute distress. Head:  normocephalic and atraumatic Eyes:  PERRLA/EOM intact; conjunctiva and lids normal. Mouth:  Teeth, gums and palate normal. Oral mucosa normal. Neck:  Neck supple, no JVD. No masses, thyromegaly or abnormal cervical nodes. Chest Wall:  no deformities or breast masses noted Lungs:  Clear bilaterally to auscultation and percussion. Heart:  Non-displaced PMI, chest non-tender; regular  rate and rhythm, S1, S2 without murmurs, rubs or gallops. Carotid upstroke normal, no bruit. Normal abdominal aortic size, no bruits. Femorals normal pulses, no bruits. Pedals normal pulses. No edema, no varicosities. Abdomen:  Bowel sounds positive; abdomen soft and non-tender without masses, organomegaly, or hernias noted. No hepatosplenomegaly. Msk:  Back normal, normal gait. Muscle strength and tone normal. Extremities:  No clubbing or  cyanosis. Neurologic:  Alert and oriented x 3. Skin:  Intact without lesions or rashes. Cervical Nodes:  no significant adenopathy Inguinal Nodes:  no significant adenopathy Psych:  Normal affect.   Impression & Recommendations:  Problem # 1:  CARDIOMYOPATHY (ICD-425.4) Today I will increase her carvedilol to 12.5 mg b.i.d. with a target of 25 mg b.i.d. The target lisinopril will be 20 mg daily.  Problem # 2:  HYPERTENSION (ICD-401.9) Her blood pressure is being managed in the context of treating her cardiomyopathy.  Patient Instructions: 1)  Your physician recommends that you schedule a follow-up appointment in: 2 months with Dr Antoine Poche 2)  Your physician has recommended you make the following change in your medication: Carvedilol 12.5 mg one twice a day Prescriptions: CARVEDILOL 12.5 MG TABS (CARVEDILOL) one twice a day  #60 x 11   Entered by:   Charolotte Capuchin, RN   Authorized by:   Rollene Rotunda, MD, [facility]   Signed by:   Charolotte Capuchin, RN on 12/23/2010   Method used:   [facility]  [address] [id number]       [address]       [place], [place]  [address]       Ph: [phone] or [phone]       Fax: [phone]   RxID:   [id number]  I have reviewed and approved all prescriptions at the time of this visit. Rollene Rotunda, MD, [facility]  December 23, 2010 3:48 PM

## 2011-01-20 NOTE — Miscellaneous (Signed)
  Clinical Lists Changes  Observations: Added new observation of CARDCATHFIND: Hemodynamics:  RA mean 3, RV 29/3, PA 3/11, pulmonary capillary wedge pressure, mean 11, LV 125/7, AO 118/78, cardiac output/cardiac index (Fick) 4.1/2.4.   Coronaries; left main essentially was nonexistent or very short with near separate ostia.  The LAD had a long proximal 25% stenosis.  It was a large vessel wrapping the apex.  The first diagonal was small with 25% stenosis.  Circumflex in the AV groove had luminal irregularities. There was very large first obtuse marginal, which was normal.  Right coronary artery was dominant.  It had long mid 25% stenosis.  PDA was moderate-sized and normal.   Left ventriculogram; left ventriculogram was obtained in the RAO projection.  The EF was approximately 25% with global hypokinesis.   CONCLUSION:  Nonobstructive minimal coronary plaque.  Moderate cardiomyopathy.   PLAN:  The patient will continue to have medical management.     (04/01/2010 14:54)      Cardiac Cath  Procedure date:  04/01/2010  Findings:      Hemodynamics:  RA mean 3, RV 29/3, PA 3/11, pulmonary capillary wedge pressure, mean 11, LV 125/7, AO 118/78, cardiac output/cardiac index (Fick) 4.1/2.4.   Coronaries; left main essentially was nonexistent or very short with near separate ostia.  The LAD had a long proximal 25% stenosis.  It was a large vessel wrapping the apex.  The first diagonal was small with 25% stenosis.  Circumflex in the AV groove had luminal irregularities. There was very large first obtuse marginal, which was normal.  Right coronary artery was dominant.  It had long mid 25% stenosis.  PDA was moderate-sized and normal.   Left ventriculogram; left ventriculogram was obtained in the RAO projection.  The EF was approximately 25% with global hypokinesis.   CONCLUSION:  Nonobstructive minimal coronary plaque.  Moderate cardiomyopathy.   PLAN:  The patient will  continue to have medical management.

## 2011-01-20 NOTE — Letter (Signed)
Summary: Cardiac Catheterization Instructions- [facility]  [facility], Main Office  [address]   [place], [place] [address]   Phone: [phone]  Fax: [phone]         03/25/2010 MRN: [id number]  Dayelin Chard [address] [place], [place]  [address]  Dear Ms. DEFINO,   You are scheduled for a Cardiac Catheterization on 04/01/2010 with Dr.Lorilynn Lehr  Please arrive to the 1st floor of the Heart and Vascular Center at [facility] at 8:30 on the day of your procedure. Please do not arrive before 6:30 a.m. Call the Heart and Vascular Center at [phone] if you are unable to make your appointmnet. The Code to get into the parking garage under the building is 0001. Take the elevators to the 1st floor. You must have someone to drive you home. Someone must be with you for the first 24 hours after you arrive home. Please wear clothes that are easy to get on and off and wear slip-on shoes. Do not eat or drink after midnight except water with your medications that morning. Bring all your medications and current insurance cards with you.  ___ DO NOT take these medications before your procedure: ________________________________________________________________  ___ Make sure you take your aspirin.  ___ You may take ALL of your medications with water that morning.  The usual length of stay after your procedure is 2 to 3 hours. This can vary.  If you have any questions, please call the office at the number listed above.   Charolotte Capuchin, RN

## 2011-01-20 NOTE — Letter (Signed)
Summary: Colonoscopy Letter  [facility]  [address] [place], [place] [address]   Phone: [phone]  Fax: [phone]      October 05, 2010 MRN: [id number]   Carol Lawrence [address] [place], [place]  [address]   Dear Ms. [name],   According to your medical record, it is time for you to schedule a Colonoscopy. The American Cancer Society recommends this procedure as a method to detect early colon cancer. Patients with a family history of colon cancer, or a personal history of colon polyps or inflammatory bowel disease are at increased risk.  This letter has been generated based on the recommendations made at the time of your procedure. If you feel that in your particular situation this may no longer apply, please contact our office.  Please call our office at [phone] to schedule this appointment or to update your records at your earliest convenience.  Thank you for cooperating with [place] to provide you with the very best care possible.   Sincerely,   Vania Rea. Jarold Motto, M.D.  [facility] Gastroenterology Division [phone]

## 2011-01-20 NOTE — Letter (Signed)
Summary: Pre Visit Letter Revised  [facility]  [address] [place], [place] [address]   Phone: [phone]  Fax: [phone]        12/22/2010 MRN: [id number] [facility] [address] [place], [place]  [address]             Procedure Date:  January 05, 2011   recall col-Dr Tomasita Morrow to the Gastroenterology Division at [facility].    You are scheduled to see a nurse for your pre-procedure visit on December 24, 2010 at 1:30pm on the 3rd floor at [facility], [address]  We ask that you try to arrive at our office 15 minutes prior to your appointment time to allow for check-in.  Please take a minute to review the attached form.  If you answer "Yes" to one or more of the questions on the first page, we ask that you call the person listed at your earliest opportunity.  If you answer "No" to all of the questions, please complete the rest of the form and bring it to your appointment.    Your nurse visit will consist of discussing your medical and surgical history, your immediate family medical history, and your medications.   If you are unable to list all of your medications on the form, please bring the medication bottles to your appointment and we will list them.  We will need to be aware of both prescribed and over the counter drugs.  We will need to know exact dosage information as well.    Please be prepared to read and sign documents such as consent forms, a financial agreement, and acknowledgement forms.  If necessary, and with your consent, a friend or relative is welcome to sit-in on the nurse visit with you.  Please bring your insurance card so that we may make a copy of it.  If your insurance requires a referral to see a specialist, please bring your referral form from your primary care physician.  No co-pay is required for this nurse visit.     If you cannot keep your appointment, please call [phone] to cancel or reschedule  prior to your appointment date.  This allows [place] the opportunity to schedule an appointment for another patient in need of care.    Thank you for choosing [facility] for your medical needs.  We appreciate the opportunity to care for you.  Please visit [place] at our website  to learn more about our practice.  Sincerely, The Gastroenterology Division

## 2011-01-20 NOTE — Assessment & Plan Note (Signed)
Summary: np6/CHF  Medications Added LIPITOR 80 MG TABS (ATORVASTATIN CALCIUM) Take 1/2 tablet by mouth once a day LISINOPRIL 5 MG TABS (LISINOPRIL) one daily PRAVASTATIN SODIUM 80 MG TABS (PRAVASTATIN SODIUM) one at bedtime      Allergies Added:   Visit Type:  Initial Consult Primary Provider:  Corwin Levins MD  CC:  CHF.  History of Present Illness: The patient presents for evaluation of a newly diagnosed cardiomyopathy. She has had no prior cardiac history. However, in January she began noticing increasing dyspnea with minimal activities progressing to dyspnea at rest. In March she had an episode of PND and presented to her primary physician. Chest x-ray demonstrated small pleural effusion with cardiomegaly. A BNP subsequently drawn was markedly elevated. Followup echocardiography demonstrated a global cardiomyopathy with an EF of 15%. She has been treated with Lasix and has had significant improvement in her dyspnea and no episodes of PND or orthopnea. Of note she has had no chest pressure, neck or arm discomfort. She has had no palpitations, presyncope or syncope. She has an active lifestyle has actually been able to maintain this.  Current Medications (verified): 1)  Lipitor 80 Mg Tabs (Atorvastatin Calcium) .... Take 1/2 Tablet By Mouth Once A Day 2)  Adult Aspirin Low Strength 81 Mg  Tbdp (Aspirin) 3)  Daily Vitamins   Tabs (Multiple Vitamin) 4)  Proair Hfa 108 (90 Base) Mcg/act Aers (Albuterol Sulfate) .... 2 Puffs Qid As Needed 5)  Vitamin D + Calcium 6)  Furosemide 40 Mg Tabs (Furosemide) .Marland Kitchen.. 1 By Mouth Once Daily  Allergies (verified): 1)  ! Codeine 2)  ! * Bee Stings  Past History:  Past Medical History: Reviewed history from 01/16/2009 and no changes required. Colonic polyps, hx of Hyperlipidemia Hypertension, borderline Allergic rhinitis H/O Glucose Intolerance H/O Bells Palsy H/O Uterine Leimyosarcoma H/O DVT - lle 2000 Peripheral vascular disease - mild  bilat carotid  Past Surgical History: Reviewed history from 10/23/2007 and no changes required. Lumbar [phone] Hysterectomy Oophorectomy s/p IVC filter  Family History: sister with CLL sister with acute leukemia Family History of CAD Female 1st degree relative <50 (Father MI 49, died 34) Family History of CAD Female 1st degree relative <60 (Mother with CAD age 29, died 47) Family History High cholesterol Family History Lung cancer mother with ostoeoporosis and COPD 2 brother died with lung cancer/smokers 1 sister died with melanoma 1 sister died with liver cancer  Social History: Former Smoker (quit 30 years ago) Alcohol use-no Married 2 grown children Retired - Printmaker - underwristing  Review of Systems       Positive for lumbar back pain mild. Otherwise as stated in the history of present illness negative for all other systems.  Vital Signs:  Patient profile:   75 year old female Height:      64.5 inches Weight:      136 pounds BMI:     23.07 Pulse rate:   98 / minute BP sitting:   150 / 72  (right arm) Cuff size:   regular  Vitals Entered By: Hardin Negus, RMA (March 25, 2010 9:48 AM)  Physical Exam  General:  Well developed, well nourished, in no acute distress. Head:  normocephalic and atraumatic Eyes:  PERRLA/EOM intact; conjunctiva and lids normal. Mouth:  Teeth, gums and palate normal. Oral mucosa normal. Neck:  Neck supple, no JVD. No masses, thyromegaly or abnormal cervical nodes. Chest Wall:  no deformities or breast masses noted Lungs:  Clear bilaterally  to auscultation and percussion. Abdomen:  Bowel sounds positive; abdomen soft and non-tender without masses, organomegaly, or hernias noted. No hepatosplenomegaly. Msk:  Back normal, normal gait. Muscle strength and tone normal. Extremities:  No clubbing or cyanosis. Neurologic:  Alert and oriented x 3. Skin:  Intact without lesions or rashes. Cervical Nodes:  no significant  adenopathy Axillary Nodes:  no significant adenopathy Inguinal Nodes:  no significant adenopathy Psych:  Normal affect.   Detailed Cardiovascular Exam  Neck    Carotids: Carotids full and equal bilaterally without bruits.      Neck Veins: Normal, no JVD.    Heart    Inspection: no deformities or lifts noted.      Palpation: normal PMI with no thrills palpable.      Auscultation: regular rate and rhythm, S1, S2 without murmurs, rubs, gallops, or clicks.    Vascular    Abdominal Aorta: no palpable masses, pulsations, or audible bruits.      Femoral Pulses: normal femoral pulses bilaterally.      Pedal Pulses: normal pedal pulses bilaterally.      Radial Pulses: normal radial pulses bilaterally.      Peripheral Circulation: no clubbing, cyanosis, or edema noted with normal capillary refill.     EKG  Procedure date:  03/25/2010  Findings:      sinus rhythm, rate 98, axis within normal limits, intervals within normal limits, left atrial enlargement, poor anterior R-wave progression suggestive of old anteroseptal infarct.  Impression & Recommendations:  Problem # 1:  UNSPECIFIED HEART FAILURE (ICD-428.9) The patient has a cardiomyopathy of unclear etiology. I must exclude coronary disease for which there is at least a moderately high pretest probability. She will have heart catheterization with right and left heart measurements. I will also today start her on Lasix 5 mg. I did spend extended period of time discussing this diagnosis the implications and treatment with the patient and her family (greater than 30 minutes). Orders: EKG w/ Interpretation (93000) TLB-CBC Platelet - w/Differential (85025-CBCD) TLB-BMP (Basic Metabolic Panel-BMET) (80048-METABOL) TLB-PTT (85730-PTTL) TLB-PT (Protime) (85610-PTP)  Problem # 2:  PERIPHERAL VASCULAR DISEASE (ICD-443.9) I reviewed carotid Dopplers as she mentioned carotid plaque in the past. However, as was very minimal and no followup is  indicated.  Problem # 3:  ELEVATED BLOOD PRESSURE WITHOUT DIAGNOSIS OF HYPERTENSION (ICD-796.2) Her blood pressure is elevated today but will be managed in the context of treating her cardiomyopathy.  Patient Instructions: 1)  Your physician recommends that you schedule a follow-up appointment after cath 2)  Your physician recommends that you continue on your current medications as directed. Please refer to the Current Medication list given to you today. 3)  Your physician has requested that you have a cardiac catheterization.  Cardiac catheterization is used to diagnose and/or treat various heart conditions. Doctors may recommend this procedure for a number of different reasons. The most common reason is to evaluate chest pain. Chest pain can be a symptom of coronary artery disease (CAD), and cardiac catheterization can show whether plaque is narrowing or blocking your heart's arteries. This procedure is also used to evaluate the valves, as well as measure the blood flow and oxygen levels in different parts of your heart.  For further information please visit [email or web address]  Please follow instruction sheet, as given. Prescriptions: PRAVASTATIN SODIUM 80 MG TABS (PRAVASTATIN SODIUM) one at bedtime  #30 x 11   Entered by:   Charolotte Capuchin, RN   Authorized by:   Rollene Rotunda, MD, [facility]  Signed by:   Charolotte Capuchin, RN on 03/25/2010   Method used:   Electronically to        [facility]  [address] [id number] ([facility])       [address]       [place], [place]  [address]       Ph: [phone] or [phone]       Fax: [phone]   RxID:   [id number] LISINOPRIL 5 MG TABS (LISINOPRIL) one daily  #30 x 11   Entered by:   Charolotte Capuchin, RN   Authorized by:   Rollene Rotunda, MD, [facility]   Signed by:   Charolotte Capuchin, RN on 03/25/2010   Method used:   Electronically to        [facility]  [address] [id number] ([facility]       [address]       [place], [place]  [address]       Ph: [phone] or [phone]       Fax: [phone]   RxID:   [id number]

## 2011-01-20 NOTE — Progress Notes (Signed)
Summary: does pt need SBE?  NO   left message for pt  Phone Note Call from Patient Call back at Home Phone [phone]   Caller: Patient Reason for Call: Talk to Nurse Summary of Call: pt has dental work on 12-21-2010 pt would to know i she needs meds for the dental work. Initial call taken by: Roe Coombs,  December 14, 2010 10:33 AM  Follow-up for Phone Call        She does not need SBE prophylaxis. Follow-up by: Rollene Rotunda, MD, [facility],  December 14, 2010 5:31 PM  Additional Follow-up for Phone Call Additional follow up Details #1::        left message for pt that no SBE is needed and to call back with further questions. Additional Follow-up by: Charolotte Capuchin, RN,  December 14, 2010 5:52 PM

## 2011-01-21 NOTE — Letter (Signed)
Summary: Beather Arbour MD  Beather Arbour MD   Imported By: Lester [place] 06/22/2010 12:23:40  _____________________________________________________________________  External Attachment:    Type:   Image     Comment:   External Document

## 2011-02-21 ENCOUNTER — Encounter: Payer: Self-pay | Admitting: Cardiology

## 2011-02-21 ENCOUNTER — Ambulatory Visit (INDEPENDENT_AMBULATORY_CARE_PROVIDER_SITE_OTHER): Payer: 59 | Admitting: Cardiology

## 2011-02-21 DIAGNOSIS — I5022 Chronic systolic (congestive) heart failure: Secondary | ICD-10-CM

## 2011-03-01 NOTE — Assessment & Plan Note (Signed)
Summary: 2 month rov.sl/sp   Visit Type:  Follow-up Primary Provider:  Corwin Levins MD  CC:  Cardiomyopathy.  History of Present Illness: The patient presents for followup of her cardiomyopathy. Since I last saw her she has been trying to exercise more. She is not feeling well and tolerating the increased dose of beta blocker. However, she has noticed that he has been exercising her blood pressure is low sometimes down into the 90s systolic. Other times her blood pressures typically in the 120s to 130s systolic. She does not describe presyncope or syncope. She has had no chest pressure, neck or arm discomfort. She has had no palpitations. She's had no weight gain or edema.  Current Medications (verified): 1)  Adult Aspirin Low Strength 81 Mg  Tbdp (Aspirin) 2)  Daily Vitamins   Tabs (Multiple Vitamin) 3)  Proair Hfa 108 (90 Base) Mcg/act Aers (Albuterol Sulfate) .... 2 Puffs Qid As Needed 4)  Vitamin D + Calcium 5)  Furosemide 40 Mg Tabs (Furosemide) .Marland Kitchen.. 1 By Mouth Once Daily 6)  Lisinopril 10 Mg Tabs (Lisinopril) .... One Daily 7)  Pravastatin Sodium 80 Mg Tabs (Pravastatin Sodium) .... One At Bedtime 8)  Carvedilol 12.5 Mg Tabs (Carvedilol) .... One Twice A Day  Allergies (verified): 1)  ! Codeine 2)  ! * Bee Stings  Past History:  Past Medical History: Reviewed history from 10/26/2010 and no changes required. Colonic polyps, hx of Hyperlipidemia Hypertension, borderline Allergic rhinitis Glucose Intolerance Bells Palsy Uterine Leimyosarcoma DVT - lle 2000 Peripheral vascular disease - mild bilat carotid Cardiomyopathy (nonischemic EF 25%)  Past Surgical History: Reviewed history from 10/23/2007 and no changes required. Lumbar [phone] Hysterectomy Oophorectomy s/p IVC filter  Review of Systems       As stated in the HPI and negative for all other systems.   Vital Signs:  Patient profile:   75 year old female Height:      65 inches Weight:      137  pounds BMI:     22.88 Pulse rate:   60 / minute Resp:     16 per minute BP sitting:   151 / 70  (right arm)  Vitals Entered By: Marrion Coy, CNA (February 21, 2011 11:05 AM)  Physical Exam  General:  Well developed, well nourished, in no acute distress. Head:  normocephalic and atraumatic Eyes:  PERRLA/EOM intact; conjunctiva and lids normal. Neck:  Neck supple, no JVD. No masses, thyromegaly or abnormal cervical nodes. Chest Wall:  no deformities or breast masses noted Lungs:  Clear bilaterally to auscultation and percussion. Abdomen:  Bowel sounds positive; abdomen soft and non-tender without masses, organomegaly, or hernias noted. No hepatosplenomegaly. Msk:  Back normal, normal gait. Muscle strength and tone normal. Extremities:  No clubbing or cyanosis. Neurologic:  Alert and oriented x 3. Skin:  Intact without lesions or rashes. Cervical Nodes:  no significant adenopathy Psych:  Normal affect.   Detailed Cardiovascular Exam  Neck    Carotids: Carotids full and equal bilaterally without bruits.      Neck Veins: Normal, no JVD.    Heart    Inspection: no deformities or lifts noted.      Palpation: normal PMI with no thrills palpable.      Auscultation: regular rate and rhythm, S1, S2 without murmurs, rubs, gallops, or clicks.    Vascular    Abdominal Aorta: no palpable masses, pulsations, or audible bruits.      Femoral Pulses: normal femoral pulses bilaterally.  Pedal Pulses: normal pedal pulses bilaterally.      Radial Pulses: normal radial pulses bilaterally.      Peripheral Circulation: no clubbing, cyanosis, or edema noted with normal capillary refill.     Impression & Recommendations:  Problem # 1:  CARDIOMYOPATHY (ICD-425.4) Because of the reported low blood pressures I will not titrate her meds. I will check an echocardiogram to see if her ejection fraction is improved a year after treatment started. She will keep a closer blood pressure diary and if I had  the opportunity because the blood pressure is not so low or the EF is low in the future I will further titrate the meds. Orders: Echocardiogram (Echo)  Problem # 2:  PALPITATIONS (ICD-785.1) She is having no new symptoms.  No change in therapy is indicated. Orders: Echocardiogram (Echo)  Problem # 3:  DYSPNEA (ICD-786.05) This is much improved and will be assessed as above.  Patient Instructions: 1)  Your physician recommends that you schedule a follow-up appointment in: 3 months with Dr Antoine Poche 2)  Your physician recommends that you continue on your current medications as directed. Please refer to the Current Medication list given to you today. 3)  Your physician has requested that you have an echocardiogram.  Echocardiography is a painless test that uses sound waves to create images of your heart. It provides your doctor with information about the size and shape of your heart and how well your heart's chambers and valves are working.  This procedure takes approximately one hour. There are no restrictions for this procedure.

## 2011-03-04 ENCOUNTER — Other Ambulatory Visit: Payer: Self-pay | Admitting: Cardiology

## 2011-03-04 ENCOUNTER — Ambulatory Visit (HOSPITAL_COMMUNITY): Payer: MEDICARE | Attending: Internal Medicine

## 2011-03-04 DIAGNOSIS — I739 Peripheral vascular disease, unspecified: Secondary | ICD-10-CM | POA: Insufficient documentation

## 2011-03-04 DIAGNOSIS — G510 Bell's palsy: Secondary | ICD-10-CM | POA: Insufficient documentation

## 2011-03-04 DIAGNOSIS — I517 Cardiomegaly: Secondary | ICD-10-CM | POA: Insufficient documentation

## 2011-03-04 DIAGNOSIS — E785 Hyperlipidemia, unspecified: Secondary | ICD-10-CM | POA: Insufficient documentation

## 2011-03-04 DIAGNOSIS — R002 Palpitations: Secondary | ICD-10-CM | POA: Insufficient documentation

## 2011-03-04 DIAGNOSIS — R0989 Other specified symptoms and signs involving the circulatory and respiratory systems: Secondary | ICD-10-CM | POA: Insufficient documentation

## 2011-03-04 DIAGNOSIS — I428 Other cardiomyopathies: Secondary | ICD-10-CM

## 2011-03-04 DIAGNOSIS — R0609 Other forms of dyspnea: Secondary | ICD-10-CM | POA: Insufficient documentation

## 2011-03-04 DIAGNOSIS — I10 Essential (primary) hypertension: Secondary | ICD-10-CM | POA: Insufficient documentation

## 2011-03-09 LAB — POCT I-STAT 3, VENOUS BLOOD GAS (G3P V)
Acid-Base Excess: 2 mmol/L (ref 0.0–2.0)
Acid-Base Excess: 2 mmol/L (ref 0.0–2.0)
Bicarbonate: 27.6 mEq/L — ABNORMAL HIGH (ref 20.0–24.0)
TCO2: 29 mmol/L (ref 0–100)
pO2, Ven: 36 mmHg (ref 30.0–45.0)
pO2, Ven: 61 mmHg — ABNORMAL HIGH (ref 30.0–45.0)

## 2011-04-06 ENCOUNTER — Other Ambulatory Visit: Payer: Self-pay | Admitting: Cardiology

## 2011-05-06 NOTE — Consult Note (Signed)
[facility]  Patient:    Carol Lawrence, Carol Lawrence                       MRN: [id number] Proc. Date: 11/29/00 Adm. Date:  [id number] Attending:  Jeannette Corpus CC:         Gretta Cool, M.D.  Harl Bowie, M.D.  Denman George, M.D.  Corwin Levins, M.D. [facility]  Telford Nab, R.N.   Consultation Report  HISTORY:  [age]-year-old white female returns for continuing followup of a poorly differentiated leiomyosarcoma of the uterus.  She had initial surgical resection in April of 1999 and subsequently was treated with adjuvant chemotherapy, giving a combination of ifosfamide and Adriamycin; this was completed in December of 1999.  Since then, the patient has been followed and has had no evidence of recurrent disease.  INTERVAL HISTORY:  Since her last visit, the patient has done well.  She is fully active and working full-time.  She denies any GI or GU symptoms and has no pelvic pain or pressure, vaginal bleeding or discharge.  She has no pulmonary symptoms.  REVIEW OF SYSTEMS:  Review of systems reveals no cardiovascular, pulmonary, neurologic, GI or GU symptoms.  FAMILY HISTORY AND SOCIAL HISTORY:  Reviewed and unchanged from previous notations.  PHYSICAL EXAMINATION  VITAL SIGNS:  Weight 134 pounds (stable).  Blood pressure 168/84, pulse 80, respiratory rate 18.  GENERAL:  Patient is a healthy white female in no acute distress.  HEENT:  Negative.  NECK:  Supple without thyromegaly.  NODES:  There is no supraclavicular, axillary or inguinal adenopathy.  ABDOMEN:  Soft and nontender.  No mass, organomegaly, ascites or herniae are noted.  PELVIC:  EG/BUS normal.  Vagina is clean and atrophic.  No lesions are noted. Bimanual and rectovaginal exams reveal no masses, induration or nodularity.  LABORATORY AND X-RAY FINDINGS:  Laboratory work reviewed.  The patient had a CAT scan of the chest, abdomen and pelvis on December  3rd which is entirely normal.  IMPRESSION:  Poorly differentiated leiomyosarcoma, now with over two years of followup since completing chemotherapy; no evidence of recurrent disease.  The patient will return to see me in six months and have a repeat CAT scan at that time.  Pap smears were obtained today. DD:  11/29/00 TD:  11/29/00 Job: [id number]

## 2011-05-06 NOTE — Consult Note (Signed)
[facility]  Patient:    Carol Lawrence, Carol Lawrence Visit Number: [id number] MRN: [id number]          Service Type: GON Location: GYN Attending Physician:  Jeannette Corpus Dictated by:   Rande Brunt. Clarke-Pearson, M.D. Proc. Date: 12/04/01 Admit Date:  12/04/2001   CC:         Gretta Cool, M.D.  Denman George, M.D.  Corwin Levins, M.D. [facility]  Telford Nab, R.N.   Consultation Report  REASON FOR FOLLOWUP:  [age]-year-old white female returns for continuing followup of a poorly differentiated leiomyosarcoma of the uterus.  She had initial surgery in April of 1999.  She subsequently was treated with an investigational protocol chemotherapy combining ifosfamide and Adriamycin. She received eight cycles of this, completed in December of 1999.  She has been followed closely with no evidence of recurrent disease.  Since her last visit, the patient has done well.  She has no constitutional symptoms.  She denies any GI or GU symptoms.  She has no abdominal pain, pelvic pain, pressure, vaginal bleeding or discharge.  Her functional status is excellent.  REVIEW OF SYSTEMS:  Negative.  FAMILY HISTORY AND SOCIAL HISTORY:  Reviewed and unchanged.  PHYSICAL EXAMINATION:  VITAL SIGNS:  Weight 135 pounds.  Blood pressure 160/78.  GENERAL:  The patient is a healthy white female in no acute distress.  HEENT:  Negative.  NECK:  Supple without thyromegaly.  NODES:  There is no supraclavicular or inguinal adenopathy.  ABDOMEN:  Soft and nontender.  No mass, organomegaly, ascites or herniae are noted.  EXTREMITIES:  The lower extremities are without edema or varicosities.  PELVIC:  EGBUS normal.  Vagina is clean, well-supported.  Bimanual and rectovaginal exam revealed no masses, induration or nodularity.  Pap smears were obtained.  IMPRESSION:  High-grade leiomyosarcoma, now with three years of followup since completing chemotherapy  with no evidence of recurrent disease.  The patient will return in six months and have a repeat CAT scan prior to that visit. Dictated by:   Rande Brunt. Clarke-Pearson, M.D. Attending Physician:  Jeannette Corpus DD:  12/05/01 TD:  12/05/01 Job: [id number]

## 2011-05-06 NOTE — Consult Note (Signed)
[facility]  Patient:    Carol Lawrence, Carol Lawrence                   MRN: [id number] Adm. Date:  [id number] Attending:  Jeannette Corpus CC:         Gretta Cool, M.D.  Denman George, M.D.  Corwin Levins, M.D. [facility]  Telford Nab, R.N.   Consultation Report  HISTORY OF PRESENT ILLNESS:  [age]-year-old white female returns for continued follow-up of a poorly differentiated leiomyosarcoma of the uterus. She had initial surgery in April 1999.  She was subsequently treated with ifosfamide and Adriamycin chemotherapy (eight cycles), completed in December 1999.  She has been followed since that time with no evidence of recurrent disease.  Since her last visit the patient has had no GI or GU symptoms.  Denies any pelvic pain, pressure, vaginal bleed, or discharge.  Her functional status is excellent.  REVIEW OF SYSTEMS:  Negative.  The patient has no GI or GU, cardiovascular, or neurologic symptoms.  FAMILY HISTORY, SOCIAL HISTORY:  Are reviewed.  She retired in February of this year from her job of 42 years.  PHYSICAL EXAMINATION:  Weight 134 pounds (stable).  Abdomen is soft and nontender.  No masses, organomegaly, ascites, or hernias noted.  There is no supraclavicular or inguinal adenopathy.  There is no CVA tenderness.  Lower extremities without edema or varicosities.  Pelvic:  EGBUS normal.  Vagina is clean, well-supported.  No lesions noted.  Bilateral rectovaginal exam revealed no masses, induration, or nodularity.  The patient had a CT scan on May 18, 2001.  This is reviewed and shows no evidence of metastatic or recurrent disease.  The patient also had cholesterol and lipid panel obtained recently, showing a cholesterol of 217.  IMPRESSION:  Leiomyosarcoma of the uterus, high-grade.  No evidence of recurrent disease.  Now with over 2-1/2 years of follow-up.  PLAN:  I will plan on seeing the patient back again in six months  and will plan on having CT scans on a once-a-year basis. DD:  05/23/01 TD:  05/23/01 Job: [id number]

## 2011-05-06 NOTE — Consult Note (Signed)
[facility]  Patient:    Carol Lawrence, Carol Lawrence                       MRN: [id number] Proc. Date: 09/12/00 Adm. Date:  [id number] Disc. Date: [id number] Attending:  Jeannette Corpus CC:         Gretta Cool, M.D.  Harl Bowie, M.D.  Denman George, M.D.  Corwin Levins, M.D. [facility]  Telford Nab, R.N.   Consultation Report  GYNECOLOGIC ONCOLOGY PROGRAM  [age]-old white female returns today for continued followup of a poorly differentiated uterine sarcoma.  She underwent initial surgical resection by Dr. Gretta Cool in April of 1999.  She subsequently received adjuvant chemotherapy and an investigation protocol, receiving ifosfamide and Adriamycin at [facility]; this was completed in December of 1999. Since that time, the patient has been undergoing close surveillance with no evidence of recurrent disease.  Since her last visit, she has done well.  She denies any GI or GU symptoms and has no pelvic pain or pressure, vaginal bleeding or discharge.  Her functional status is excellent.  REVIEW OF SYSTEMS:  Review of systems reveals no cerebrovascular, pulmonary, neurologic, GI, GU or musculoskeletal problems.  FAMILY HISTORY AND SOCIAL HISTORY:  Family history and social history are reviewed and unchanged from previous notation.  PHYSICAL EXAMINATION  VITAL SIGNS:  Weight 133 pounds (stable).  Blood pressure 164/92.  GENERAL:  The patient is a healthy white female in no acute distress.  HEENT:  Negative.  NECK:  Supple without thyromegaly.  LYMPHATICS:  There is no supraclavicular, axillary or inguinal adenopathy.  ABDOMEN:  Soft and nontender.  No mass, organomegaly, ascites or herniae are noted.  Her incision is well-healed.  PELVIC:  EG/BUS normal.  Vagina is clean, well-supported.  Bimanual and rectovaginal exams revealed no masses, induration or nodularity.  EXTREMITIES:  Lower extremities without  edema or varicosities.  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  Patient is clinically free of disease.  PLAN:  The patient will return to see [place] in three months and have a CAT scan just prior to that time.  Pap smears are obtained today. DD:  09/12/00 TD:  09/13/00 Job: [id number]

## 2011-05-06 NOTE — Discharge Summary (Signed)
NAME:  Carol Lawrence, Carol Lawrence                      ACCOUNT NO.:  [id number]   MEDICAL RECORD NO.:  [id number]                   PATIENT TYPE:  INP   LOCATION:  0472                                 FACILITY:  [facility]   PHYSICIAN:  Titus Dubin. Alwyn Ren, M.D. [facility]         DATE OF BIRTH:  06-29-1936   DATE OF ADMISSION:  08/12/2004  DATE OF DISCHARGE:  08/15/2004                                 DISCHARGE SUMMARY   ADMISSION DIAGNOSES:  1. Fever of unknown etiology associated with rigor and chills.  2. History of uterine leiomyosarcoma.  3. Hypercholesterolemia.  4. History of deep vein thrombosis.  5. Hypokalemia.   DISCHARGE DIAGNOSES:  1. Fever of unknown etiology associated with rigor and chills, resolved.  2. History of uterine leiomyosarcoma.  3. Hypercholesterolemia.  4. History of deep vein thrombosis.  5. Hypokalemia.   HISTORY:  Carol Lawrence is a 75 year old white female who has had recurring  fevers up to 104 with rigor and chills since August 02, 2004.  The  temperature is typically elevated at 2 p.m., but may range from 100 to 102  at other hours of the day.  It is associated with a global headache.  She  has had a slight dry cough.  She has no significant travel history or  contact with ill individuals or sick animals.  She did take Avelox for two  days prior to admission.  She denies any tick exposure.   On August 12, 2004, her temperature was 104.1, but defervesced.  Subsequently, it remained essentially normal except for a temperature of  100.1 early on the morning of August 13, 2004.  Since that time, she has  been afebrile.   Blood cultures are pending, but negative to date.  White count was checked  twice and was normal at 4900 and 5900.  She exhibited a mild anemia with a  hematocrit of 35.8 on followup CBC.  Sedimentation rate was 42.  Her  potassium was initially 3.1, but was corrected to 3.6.  Glucoses were mildly  elevated at 128 and 139, respectively.  ALT was  minimally elevated at 41.  HIV was nonreactive.  TSH was normal.  Urine revealed trace leukocyte  esterase with a few bacteria.  Rh factor was less than 20, and ANA was  negative.   She did exhibit an intermittent non-productive cough.  Chest x-ray showed  central peribronchial thickening suggesting possible low-grade bronchitis or  an asthmatic process or a previous history of smoking.  There was no  pneumonia or consolidation.  CT scan angio with contrast revealed a very  small right pleural effusion with no evidence of pulmonary emboli and no  evidence of any thoracic metastases.  CT scan of the abdomen and pelvis  revealed evidence of previous hysterectomy and oophorectomy.  There were no  pelvic masses, adenopathy, or free fluid.  There were no bony changes.   Her cough improved significantly with the addition of Mucinex  DM.  During  the hospitalization, she received Lovenox prophylaxis.  She also received  potassium supplementation.   She will be discharged on a low cholesterol diet and asked to follow up with  Dr. Jonny Ruiz in 7 to 10 days.  She will be asked to monitor her temperature with  a thermometer and keep a temperature diary.  She will be asked to drink at  least 40 ounces of water a day and take the Mucinex DM q.12h.   At the time of discharge, her PPD was negative.  She was afebrile.  She had  no organomegaly or lymphadenopathy.  She had a grade 1/2 systolic murmur.   Should the temperatures recur, then an echocardiogram and/or transesophageal  echocardiogram would be recommended.  She will be asked to visualize any  sputum which is produced.  If she has purulent secretions then culture will  be performed.   CONDITION ON DISCHARGE:  Improved.   PROGNOSIS:  Good.                                               Titus Dubin. Alwyn Ren, M.D. [facility]    WFH/MEDQ  D:  08/15/2004  T:  08/16/2004  Job:  [id number]   cc:   Corwin Levins, M.D. [facility]

## 2011-05-06 NOTE — Consult Note (Signed)
NAME:  Carol Lawrence, Carol Lawrence                      ACCOUNT NO.:  [id number]   MEDICAL RECORD NO.:  [id number]                   PATIENT TYPE:  OUT   LOCATION:  GYN                                  [facility]ACILITY[facility]  [facility]   PHYSICIAN:  De Blanch, M.D.         DATE OF BIRTH:  1936/10/24   DATE OF CONSULTATION:  07/09/2003  DATE OF DISCHARGE:                                   CONSULTATION   A 75 year old white female returns for continuing follow-up of a poorly  differentiated leiomyosarcoma of the uterus.   INTERVAL HISTORY:  Since her last visit the patient has done well.  She  denies any GI or GU symptoms.  There is no pelvic pain, pressure, vaginal  bleeding or discharge.  She had a CAT scan of the chest, abdomen, and pelvis  on July 19 which was entirely negative.   The patient saw Gretta Cool, M.D. in April and everything was going  well at that time.   HISTORY OF PRESENT ILLNESS:  The patient underwent total abdominal  hysterectomy and bilateral salpingo-oophorectomy in April 1999 for what  turned out to be a poorly differentiated leiomyosarcoma of the uterus.  Her  postoperative course was complicated by deep venous thrombosis.  She  received adjuvant chemotherapy using iphosphamide and adriamycin for eight  cycles.  Chemotherapy was completed in December 1999.   REVIEW OF SYSTEMS:  No cardiovascular, neurologic, GI, GU, pulmonary,  musculoskeletal, or gynecologic symptoms.   CURRENT MEDICATIONS:  1. Pravachol.  2. Multivitamins.   ALLERGIES:  None.   FAMILY HISTORY:  Negative for gynecologic, breast, or colon cancer.   PHYSICAL EXAMINATION:  VITAL SIGNS:  Weight 127 pounds, blood pressure  130/74.  GENERAL:  The patient is a healthy white female in no acute distress.  HEENT:  Negative.  NECK:  Supple without thyromegaly.  LYMPH:  There is no supraclavicular or inguinal adenopathy.  ABDOMEN:  Soft, nontender.  No mass, organomegaly, ascites, or hernias  noted.  PELVIC:  EGBUS, vagina, bladder, urethra are normal.  Cervix and uterus  surgically absent.  Bimanual and rectovaginal examination reveal no adnexal  masses.  EXTREMITIES:  Lower extremities without edema or varicosities.   IMPRESSION:  Poorly differentiated leiomyosarcoma of the uterus stage I  1999.  No evidence of recurrent disease.   PLAN:  Pap smears are obtained today.  The patient will return to see  Gretta Cool, M.D. in April of 2005 and I will plan on seeing her again  in October of 2005.  At this juncture we will continue gynecologic oncology  follow-up for this rare and aggressive cancer on an annual basis.                                               De Blanch, M.D.  DC/MEDQ  D:  07/09/2003  T:  07/09/2003  Job:  [id number]   cc:   Gretta Cool, M.D.  [address] [place]  [place] [address]  Fax: [phone]   P. Liliane Bade, M.D.  [address]  [place]  [place] [address]  Fax: [phone]   Telford Nab, R.N.  [address]  [place], [place] [address]   Corwin Levins, M.D. [facility]

## 2011-05-06 NOTE — Consult Note (Signed)
NAME:  Carol Lawrence, REDER NO.:  [id number]   MEDICAL RECORD NO.:  [id number]          PATIENT TYPE:  OUT   LOCATION:  GYN                          FACILITY:  [facility]   PHYSICIAN:  De Blanch, M.D.DATE OF BIRTH:  1936-06-10   DATE OF CONSULTATION:  DATE OF DISCHARGE:                                   CONSULTATION   A 75 year old white female returns for continuing followup of a high-grade  leiomyosarcoma.   Since her last visit, the patient has had no gynecologic, GI or GU symptoms.  She was admitted to the hospital for approximately four days this past  summer with a high fever of 104 degrees.  Apparently, workup was  nonrevealing.  She did have a CAT scan and an MRI, and she tells me that  showed no evidence of recurrent disease.   HISTORY OF PRESENT ILLNESS:  Patient underwent TAH/BSO in April, 1999 and  was found to have a poorly differentiated leiomyosarcoma of the uterus.  She  received adjuvant chemotherapy in December, 1999, using a combination of  iphosphamide and Adriamycin for eight cycles.  She has been followed since  that time with no evidence of disease.   CURRENT MEDICATIONS:  Pravachol.  Multivitamins.   DRUG ALLERGIES:  None.   FAMILY HISTORY:  Negative for gynecologic, breast, and colon cancer.   PAST SURGICAL HISTORY:  TAH/BSO.   REVIEW OF SYSTEMS:  Otherwise negative.   The patient recently returned from a trip to [facility] with her husband.   PHYSICAL EXAMINATION:  VITAL SIGNS:  Weight 126 pounds.  Blood pressure  160/92.  GENERAL:  The patient is a healthy white female in no acute distress.  HEENT:  Negative.  NECK:  Supple without thyromegaly.  There is no supraclavicular or inguinal  adenopathy.  ABDOMEN:  Soft and nontender.  No mass, organomegaly, ascites, or hernias  are noted.  PELVIC:  EG/BUS, vagina, bladder, and urethra are normal.  The vagina is  atrophic.  The cervix and uterus is surgically absent.  Adnexa  without  masses.  Rectovaginal exam confirms.  EXTREMITIES:  Lower extremities are without edema or varicosities.   IMPRESSION:  High-grade leiomyosarcoma.  No evidence for recurrent disease.  Now with nearly six years of followup.   PLAN:  Patient will return to see Dr. Nicholas Lose in six months.  Return to see [place]  in one year.  A Pap smear is repeated today.     Dani   DC/MEDQ  D:  10/05/2004  T:  10/05/2004  Job:  [id number]   cc:   Gretta Cool, M.D.  [address] [place]  [place] [address]  Fax: [phone]   Telford Nab, R.N.  [phone] [address]  [place], [place] [address]

## 2011-05-06 NOTE — H&P (Signed)
[facility]  Patient:    Carol Lawrence, Carol Lawrence                       MRN: [id number] Adm. Date:  [id number] Attending:  Jeannette Corpus CC:         Gretta Cool, M.D.             Harl Bowie, M.D.             Denman George, M.D.             Corwin Levins, M.D. [facility]             Telford Nab, R.N.                         History and Physical  GYN-ONCOLOGY PROGRAM  HISTORY:  A 75 year old white female who comes for continued follow-up of a leiomyosarcoma of the uterus.  She underwent initial surgical resection of a poorly differentiated sarcoma by Dr. Beather Arbour in April 1999.  She elected to participate in the [facility] in-house chemotherapy adjuvant protocol and received eight cycles of ifosfamide and Adriamycin chemotherapy which was completed in December 1999.  Since her last visit, the patient has done well.  She denies any GI or GU symptoms.  She has no pelvic pain, pressure, vaginal bleeding, or discharge. Her functional status is excellent.  REVIEW OF SYSTEMS:  The patient denies any cardiovascular, pulmonary, neurologic, GI, or GU symptoms.  FAMILY HISTORY AND SOCIAL HISTORY:  Reviewed and unchanged.  The patient has recently returned from a family reunion and seven day cruise in the [place].  PHYSICAL EXAMINATION:  GENERAL:  Weight 134 pounds (stable).  The patient is a healthy white female in no acute distress.  VITAL SIGNS:  Blood pressure 140/80.  HEENT:  Negative.  NECK:  Supple without thyromegaly.  There is no supraclavicular, axillary or inguinal adenopathy.  ABDOMEN:  Soft and nontender.  No masses, organomegaly, ascites, or hernias are noted.  PELVIC EXAM:  EGBUS normal.  Vagina is clean and well supported.  Bimanual rectovaginal exam revealed no masses, induration or nodularity.  LABORATORY DATA:  Laboratory work is reviewed.  The patient had a CAT scan yesterday of the chest, abdomen, and pelvis.  There  is no evidence of metastatic disease on this CAT scan.  IMPRESSION:  Poorly differentiated leiomyosarcoma with no evidence of recurrent disease.  The patient will return to see me again in three months for continued follow up and we will plan on repeat CAT scan again in six months which will be at a point in time she would be two years following completion of her chemotherapy. D:  05/31/00 TD:  05/31/00 Job: [id number]

## 2011-05-06 NOTE — Consult Note (Signed)
   NAME:  Carol Lawrence, Carol Lawrence                      ACCOUNT NO.:  [id number]   MEDICAL RECORD NO.:  [id number]                   PATIENT TYPE:  OUT   LOCATION:  GYN                                  FACILITY:  [facility]   PHYSICIAN:  De Blanch, M.D.         DATE OF BIRTH:  1936-02-15   DATE OF CONSULTATION:  12/25/2002  DATE OF DISCHARGE:  12/25/2002                                   CONSULTATION   A 75 year old white female returns for continuing follow-up.  She had a  poorly differentiated leiomyosarcoma of the uterus.  She underwent initial  surgery in April 1999 and received adjuvant chemotherapy (iphosphamide and  adriamycin for eight cycles).  Chemotherapy was completed in December of  1999.  The patient has been followed since that time with no evidence of  recurrent disease.   INTERVAL HISTORY:  Since her last visit the patient has continued to do  well.  She denies any GI or GU symptoms.  Has no pelvic pain, pressure,  vaginal bleeding, or discharge.  Her functional status has been excellent.   REVIEW OF SYSTEMS:  No cardiovascular, neurologic, GI, GU, pulmonary, or  musculoskeletal symptoms.   FAMILY HISTORY:  Reviewed and unchanged.   PHYSICAL EXAMINATION:  VITAL SIGNS:  Weight 130 pounds (stable), blood  pressure 160/88.  GENERAL:  The patient is a healthy white female in no acute distress.  HEENT:  Negative.  NECK:  Supple without thyromegaly.  There is no supraclavicular or inguinal  adenopathy.  ABDOMEN:  Soft, nontender.  No masses, organomegaly, ascites, or hernias are  noted.  PELVIC:  EGBUS, vagina, bladder, urethra are normal.  Cervix and uterus  surgically absent.  Bimanual examination reveals no masses, induration, or  nodularity.  Rectovaginal examination confirms.   IMPRESSION:  Stage I poorly differentiated leiomyosarcoma of the uterus  1999.  No evidence of recurrent disease.   PLAN:  Pap smears are obtained.  The patient will return in six  months for  continuing follow-up and prior to that visit will have another CAT scan of  the chest, abdomen, and pelvis.                                               De Blanch, M.D.    DC/MEDQ  D:  12/30/2002  T:  12/30/2002  Job:  [id number]   cc:   Gretta Cool, M.D.  [address] [place]  [place] [address]  Fax: [phone]   P. Liliane Bade, M.D.  [address]  [place]  [place] [address]  Fax: [phone]   Corwin Levins, M.D. [facility]   Telford Nab, R.N.  [address] [place], [place] [address]  Fax: 1

## 2011-05-06 NOTE — Consult Note (Signed)
[facility]  Patient:    Carol Lawrence, KOZLOV Visit Number: [id number] MRN: [id number]          Service Type: GON Location: GYN Attending Physician:  Jeannette Corpus Dictated by:   Rande Brunt. Clarke-Pearson, M.D. Proc. Date: 06/12/02 Admit Date:  06/12/2002 Discharge Date: 06/12/2002   CC:         Gretta Cool, M.D.  Denman George, M.D.  Corwin Levins, M.D. [facility]  Telford Nab, R.N.   Consultation Report  HISTORY OF PRESENT ILLNESS:  This 75 year old white female returns for continuing followup.  She had a poorly differentiated leiomyosarcoma of the uterus undergoing initial surgery by Dr. Beather Arbour in April of 1999.  She was subsequently treated on an investigational protocol receiving ifosfamide and Adriamycin for eight cycles under the direction of the Gynecologic Oncology Division at [facility].  Chemotherapy was completed in December of 1999 and the patient has been followed since that time with no evidence of recurrent disease.  Since her last visit, the patient has continued to do well.  She denies any GI or GU symptoms.  She has no pelvic pain, pressure, vaginal bleeding, or discharge.  She recently had a followup CT scan (May 28, 2002) of the chest, abdomen, and pelvis that shows no evidence of metastatic disease.  REVIEW OF SYSTEMS:  Negative.  She denies any cardiovascular, neurologic, GI, GU, or pulmonary symptoms.  Her functional status is excellent.  FAMILY HISTORY/SOCIAL HISTORY:  Reviewed and unchanged.  The patient recently returned from a long [place] cruise with her family.  PHYSICAL EXAMINATION:  VITAL SIGNS:  Weight 131 pounds, blood pressure 144/86.  GENERAL:  The patient is a healthy white female in no acute distress.  HEENT:  Negative.  NECK:  Supple without thyromegaly.  No supraclavicular, axillary or inguinal adenopathy.  ABDOMEN:  Soft and nontender.  No mass, organomegaly,  ascites, or hernias noted.  PELVIC:  EG/BUS normal.  Vagina is clean and well supported.  Cervix and uterus surgically absent.  Adnexa without masses.  Anus normal.  LOWER EXTREMITIES:  Without edema or varicosities.  IMPRESSION:  Poorly differentiated leiomyosarcoma status post surgical resection and adjuvant chemotherapy clinically free of disease.  PLAN:  The patient will return to see [place] in six months for continuing followup and see Dr. Nicholas Lose as previously scheduled. Dictated by:   Rande Brunt. Clarke-Pearson, M.D. Attending Physician:  Jeannette Corpus DD:  06/16/02 TD:  06/17/02 Job: [id number]

## 2011-05-13 ENCOUNTER — Encounter: Payer: Self-pay | Admitting: Cardiology

## 2011-05-17 ENCOUNTER — Telehealth: Payer: Self-pay | Admitting: Physician Assistant

## 2011-05-23 ENCOUNTER — Ambulatory Visit (INDEPENDENT_AMBULATORY_CARE_PROVIDER_SITE_OTHER): Payer: Medicare Other | Admitting: Cardiology

## 2011-05-23 ENCOUNTER — Encounter: Payer: Self-pay | Admitting: Cardiology

## 2011-05-23 DIAGNOSIS — I10 Essential (primary) hypertension: Secondary | ICD-10-CM

## 2011-05-23 DIAGNOSIS — R002 Palpitations: Secondary | ICD-10-CM

## 2011-05-23 DIAGNOSIS — I428 Other cardiomyopathies: Secondary | ICD-10-CM

## 2011-05-23 NOTE — Assessment & Plan Note (Signed)
Her blood pressure is slightly elevated but it was low previously and he goes low after exercise. Therefore, I will not titrate her medications further.

## 2011-05-23 NOTE — Assessment & Plan Note (Signed)
She no longer it will be. She will take therapy.

## 2011-05-23 NOTE — Patient Instructions (Signed)
Follow up in 12 months with Dr Antoine Poche Continue current medications as listed

## 2011-05-23 NOTE — Assessment & Plan Note (Signed)
Her ejection fraction is now much improved. She can reduce her Lasix to 20 mg daily and then possibly discontinue. She will otherwise remain on meds as listed.

## 2011-05-23 NOTE — Progress Notes (Signed)
HPI The patient presents for followup of her cardiomyopathy. I was very pleased. the recent ejection fraction was improved from 15% to 55%.  She is doing very well and she denies any shortness of breath, PND or orthopnea. She has no palpitations, presyncope or syncope. She has no chest pressure, neck or arm discomfort. She said no weight gain or edema. She is exercising routinely.  Allergies  Allergen Reactions  . Amoxicillin     Curam---from [place]  . Codeine     Current Outpatient Prescriptions  Medication Sig Dispense Refill  . albuterol (PROAIR HFA) 108 (90 BASE) MCG/ACT inhaler Inhale 2 puffs into the lungs every 6 (six) hours as needed.        Marland Kitchen aspirin 81 MG tablet Take 81 mg by mouth daily.        . Calcium Carbonate-Vitamin D (CALCIUM 600+D) 600-400 MG-UNIT per tablet Take 1 tablet by mouth daily.        . carvedilol (COREG) 12.5 MG tablet Take 12.5 mg by mouth 2 (two) times daily with a meal.        . furosemide (LASIX) 40 MG tablet Take 40 mg by mouth daily.        Marland Kitchen lisinopril (PRINIVIL,ZESTRIL) 10 MG tablet Take 10 mg by mouth daily.        . Multiple Vitamin (MULTIVITAMIN) tablet Take 1 tablet by mouth daily.        . pravastatin (PRAVACHOL) 80 MG tablet TAKE 1 TABLET BY MOUTH AT BEDTIME  30 tablet  11    Past Medical History  Diagnosis Date  . Colonic polyp   . HLD (hyperlipidemia)   . HTN (hypertension)   . Allergic rhinitis   . Glucose intolerance (impaired glucose tolerance)   . Bell's palsy   . DVT (deep venous thrombosis)   . PVD (peripheral vascular disease)   . NICM (nonischemic cardiomyopathy)     Past Surgical History  Procedure Date  . Lumbar fusion 1983  . Hysterectomy - unknown type   . Oophorectomy     ROS:  As stated in the HPI and negative for all other systems.  PHYSICAL EXAM BP 146/66  Pulse 65  Resp 16  Ht 5\' 5"  (1.651 m)  Wt 134 lb (60.782 kg)  BMI 22.30 kg/m2 GENERAL:  Well appearing HEENT:  Pupils equal round and reactive, fundi  not visualized, oral mucosa unremarkable NECK:  No jugular venous distention, waveform within normal limits, carotid upstroke brisk and symmetric, no bruits, no thyromegaly LYMPHATICS:  No cervical, inguinal adenopathy LUNGS:  Clear to auscultation bilaterally BACK:  No CVA tenderness CHEST:  Unremarkable HEART:  PMI not displaced or sustained,S1 and S2 within normal limits, no S3, no S4, no clicks, no rubs, no murmurs ABD:  Flat, positive bowel sounds normal in frequency in pitch, no bruits, no rebound, no guarding, no midline pulsatile mass, no hepatomegaly, no splenomegaly EXT:  2 plus pulses throughout, no edema, no cyanosis no clubbing SKIN:  No rashes no nodules NEURO:  Cranial nerves II through XII grossly intact, motor grossly intact throughout PSYCH:  Cognitively intact, oriented to person place and time  EKG:  Sinus rhythm, rate 65, axis within normal limits, intervals within normal limits, no acute ST-T wave changes.   ASSESSMENT AND PLAN

## 2011-05-31 ENCOUNTER — Other Ambulatory Visit: Payer: Self-pay | Admitting: Internal Medicine

## 2011-06-04 NOTE — Telephone Encounter (Signed)
Nothing neede.

## 2011-06-07 ENCOUNTER — Other Ambulatory Visit: Payer: Self-pay | Admitting: Gynecology

## 2011-06-07 DIAGNOSIS — Z1231 Encounter for screening mammogram for malignant neoplasm of breast: Secondary | ICD-10-CM

## 2011-06-21 ENCOUNTER — Ambulatory Visit
Admission: RE | Admit: 2011-06-21 | Discharge: 2011-06-21 | Disposition: A | Discharge status: Home / Self Care | Payer: Medicare Other | Source: Ambulatory Visit | Attending: Gynecology | Admitting: Gynecology

## 2011-06-21 DIAGNOSIS — Z1231 Encounter for screening mammogram for malignant neoplasm of breast: Secondary | ICD-10-CM

## 2011-06-24 ENCOUNTER — Other Ambulatory Visit: Payer: Self-pay | Admitting: Gynecology

## 2011-06-24 DIAGNOSIS — R928 Other abnormal and inconclusive findings on diagnostic imaging of breast: Secondary | ICD-10-CM

## 2011-06-28 ENCOUNTER — Other Ambulatory Visit: Payer: Self-pay | Admitting: Cardiology

## 2011-06-28 ENCOUNTER — Other Ambulatory Visit: Payer: Self-pay | Admitting: Gynecology

## 2011-07-01 ENCOUNTER — Other Ambulatory Visit: Payer: Self-pay | Admitting: Internal Medicine

## 2011-07-12 ENCOUNTER — Ambulatory Visit
Admission: RE | Admit: 2011-07-12 | Discharge: 2011-07-12 | Disposition: A | Discharge status: Home / Self Care | Payer: Medicare Other | Source: Ambulatory Visit | Attending: Gynecology | Admitting: Gynecology

## 2011-07-12 DIAGNOSIS — R928 Other abnormal and inconclusive findings on diagnostic imaging of breast: Secondary | ICD-10-CM

## 2011-08-26 ENCOUNTER — Encounter: Payer: Self-pay | Admitting: Internal Medicine

## 2011-08-26 DIAGNOSIS — Z0001 Encounter for general adult medical examination with abnormal findings: Secondary | ICD-10-CM | POA: Insufficient documentation

## 2011-08-26 DIAGNOSIS — R7302 Impaired glucose tolerance (oral): Secondary | ICD-10-CM | POA: Insufficient documentation

## 2011-08-26 DIAGNOSIS — Z0000 Encounter for general adult medical examination without abnormal findings: Secondary | ICD-10-CM

## 2011-08-26 DIAGNOSIS — I420 Dilated cardiomyopathy: Secondary | ICD-10-CM

## 2011-08-26 HISTORY — DX: Dilated cardiomyopathy: I42.0

## 2011-08-30 ENCOUNTER — Ambulatory Visit (INDEPENDENT_AMBULATORY_CARE_PROVIDER_SITE_OTHER): Payer: Medicare Other | Admitting: Internal Medicine

## 2011-08-30 ENCOUNTER — Encounter: Payer: Self-pay | Admitting: Internal Medicine

## 2011-08-30 ENCOUNTER — Other Ambulatory Visit (INDEPENDENT_AMBULATORY_CARE_PROVIDER_SITE_OTHER): Payer: Medicare Other

## 2011-08-30 VITALS — BP 122/70 | HR 59 | Temp 98.2°F | Ht 64.0 in | Wt 136.0 lb

## 2011-08-30 DIAGNOSIS — R7309 Other abnormal glucose: Secondary | ICD-10-CM

## 2011-08-30 DIAGNOSIS — Z23 Encounter for immunization: Secondary | ICD-10-CM

## 2011-08-30 DIAGNOSIS — Z79899 Other long term (current) drug therapy: Secondary | ICD-10-CM

## 2011-08-30 DIAGNOSIS — R7302 Impaired glucose tolerance (oral): Secondary | ICD-10-CM

## 2011-08-30 DIAGNOSIS — Z0000 Encounter for general adult medical examination without abnormal findings: Secondary | ICD-10-CM

## 2011-08-30 LAB — CBC WITH DIFFERENTIAL/PLATELET
Basophils Absolute: 0 10*3/uL (ref 0.0–0.1)
Eosinophils Absolute: 0.1 10*3/uL (ref 0.0–0.7)
HCT: 44.5 % (ref 36.0–46.0)
Hemoglobin: 14.8 g/dL (ref 12.0–15.0)
Lymphs Abs: 1 10*3/uL (ref 0.7–4.0)
MCHC: 33.4 g/dL (ref 30.0–36.0)
MCV: 90.2 fl (ref 78.0–100.0)
Neutro Abs: 2.9 10*3/uL (ref 1.4–7.7)
RDW: 14.1 % (ref 11.5–14.6)

## 2011-08-30 LAB — URINALYSIS, ROUTINE W REFLEX MICROSCOPIC
Bilirubin Urine: NEGATIVE
Ketones, ur: NEGATIVE
Nitrite: NEGATIVE
Total Protein, Urine: NEGATIVE
Urine Glucose: NEGATIVE

## 2011-08-30 MED ORDER — PNEUMOCOCCAL VAC POLYVALENT 25 MCG/0.5ML IJ INJ: 0.5000 mL | INJECTION | Freq: Once | INTRAMUSCULAR | Status: DC

## 2011-08-30 MED ORDER — FUROSEMIDE 20 MG PO TABS: 20.0000 mg | ORAL_TABLET | Freq: Every day | ORAL | Status: DC

## 2011-08-30 NOTE — Progress Notes (Signed)
Subjective:    Patient ID: Carol Lawrence, female    DOB: 1936/06/14, 75 y.o.   MRN: [id number]  HPI  Here for wellness and f/u;  Overall doing ok;  Pt denies CP, worsening SOB, DOE, wheezing, orthopnea, PND, worsening LE edema, palpitations, dizziness or syncope.  Pt denies neurological change such as new Headache, facial or extremity weakness.  Pt denies polydipsia, polyuria, or low sugar symptoms. Pt states overall good compliance with treatment and medications, good tolerability, and trying to follow lower cholesterol diet.  Pt denies worsening depressive symptoms, suicidal ideation or panic. No fever, wt loss, night sweats, loss of appetite, or other constitutional symptoms.  Pt states good ability with ADL's, low fall risk, home safety reviewed and adequate, no significant changes in hearing or vision, and occasionally active with exercise.  Trying to walk 3 times per wk Past Medical History  Diagnosis Date  . Colonic polyp   . HLD (hyperlipidemia)   . HTN (hypertension)   . Allergic rhinitis   . Glucose intolerance (impaired glucose tolerance)   . Bell's palsy   . DVT (deep venous thrombosis)   . PVD (peripheral vascular disease)   . NICM (nonischemic cardiomyopathy)   . Impaired glucose tolerance 08/26/2011   Past Surgical History  Procedure Date  . Lumbar fusion 1983  . Hysterectomy - unknown type   . Oophorectomy     reports that she quit smoking about 30 years ago. She does not have any smokeless tobacco history on file. She reports that she does not drink alcohol. Her drug history not on file. family history includes COPD in her mother and unspecified family member; Coronary artery disease in an unspecified family member; Hyperlipidemia in an unspecified family member; Leukemia in an unspecified family member; Liver cancer in an unspecified family member; Lung cancer in an unspecified family member; Melanoma in an unspecified family member; and Osteoporosis in her mother and  unspecified family member. Allergies  Allergen Reactions  . Amoxicillin     Curam---from [facility]  . Codeine    Current Outpatient Prescriptions on File Prior to Visit  Medication Sig Dispense Refill  . albuterol (PROAIR HFA) 108 (90 BASE) MCG/ACT inhaler Inhale 2 puffs into the lungs every 6 (six) hours as needed.        Marland Kitchen aspirin 81 MG tablet Take 81 mg by mouth daily.        . Calcium Carbonate-Vitamin D (CALCIUM 600+D) 600-400 MG-UNIT per tablet Take 1 tablet by mouth daily.        . carvedilol (COREG) 12.5 MG tablet Take 12.5 mg by mouth 2 (two) times daily with a meal.        . lisinopril (PRINIVIL,ZESTRIL) 10 MG tablet TAKE 1 TABLET EVERY DAY  90 tablet  3  . Multiple Vitamin (MULTIVITAMIN) tablet Take 1 tablet by mouth daily.        . pravastatin (PRAVACHOL) 80 MG tablet TAKE 1 TABLET BY MOUTH AT BEDTIME  30 tablet  11   No current facility-administered medications on file prior to visit.   Review of Systems Review of Systems  Constitutional: Negative for diaphoresis, activity change, appetite change and unexpected weight change.  HENT: Negative for hearing loss, ear pain, facial swelling, mouth sores and neck stiffness.   Eyes: Negative for pain, redness and visual disturbance.  Respiratory: Negative for shortness of breath and wheezing.   Cardiovascular: Negative for chest pain and palpitations.  Gastrointestinal: Negative for diarrhea, blood in stool, abdominal distention and  rectal pain.  Genitourinary: Negative for hematuria, flank pain and decreased urine volume.  Musculoskeletal: Negative for myalgias and joint swelling.  Skin: Negative for color change and wound.  Neurological: Negative for syncope and numbness.  Hematological: Negative for adenopathy.  Psychiatric/Behavioral: Negative for hallucinations, self-injury, decreased concentration and agitation.      Objective:   Physical Exam BP 122/70  Pulse 59  Temp(Src) 98.2 F (36.8 C) (Oral)  Ht 5\' 4"  (1.626 m)   Wt 136 lb (61.689 kg)  BMI 23.34 kg/m2  SpO2 98% Physical Exam  VS noted Constitutional: Pt is oriented to person, place, and time. Appears well-developed and well-nourished.  HENT:  Head: Normocephalic and atraumatic.  Right Ear: External ear normal.  Left Ear: External ear normal.  Nose: Nose normal.  Mouth/Throat: Oropharynx is clear and moist.  Eyes: Conjunctivae and EOM are normal. Pupils are equal, round, and reactive to light.  Neck: Normal range of motion. Neck supple. No JVD present. No tracheal deviation present.  Cardiovascular: Normal rate, regular rhythm, normal heart sounds and intact distal pulses.   Pulmonary/Chest: Effort normal and breath sounds normal.  Abdominal: Soft. Bowel sounds are normal. There is no tenderness.  Musculoskeletal: Normal range of motion. Exhibits no edema.  Lymphadenopathy:  Has no cervical adenopathy.  Neurological: Pt is alert and oriented to person, place, and time. Pt has normal reflexes. No cranial nerve deficit.  Skin: Skin is warm and dry. No rash noted.  Psychiatric:  Has  normal mood and affect. Behavior is normal.         Assessment & Plan:

## 2011-08-30 NOTE — Assessment & Plan Note (Signed)
Overall doing well, age appropriate education and counseling updated, referrals for preventative services and immunizations addressed, dietary and smoking counseling addressed, most recent labs and ECG reviewed.  I have personally reviewed and have noted: 1) the patient's medical and social history 2) The pt's use of alcohol, tobacco, and illicit drugs 3) The patient's current medications and supplements 4) Functional ability including ADL's, fall risk, home safety risk, hearing and visual impairment 5) Diet and physical activities 6) Evidence for depression or mood disorder 7) The patient's height, weight, and BMI have been recorded in the chart I have made referrals, and provided counseling and education based on review of the above For pneumonia shot today, delcines flu but will return later

## 2011-08-30 NOTE — Patient Instructions (Signed)
You had the pneumonia shot today Please go to LAB in the Basement for the blood and/or urine tests to be done today Please call the phone number [phone] (the [employer] System) for results of testing in 2-3 days;  When calling, simply dial the number, and when prompted enter the MRN number above (the Medical Record Number) and the # key, then the message should start. Please return in 1 year for your yearly visit, or sooner if needed, with Lab testing done 3-5 days before

## 2011-08-30 NOTE — Assessment & Plan Note (Signed)
stable overall by hx and exam, most recent data reviewed with pt, and pt to continue medical treatment as before  Lab Results  Component Value Date   WBC 4.6 06/09/2010   HGB 14.6 06/09/2010   HCT 43.1 06/09/2010   PLT 142.0* 06/09/2010   CHOL 164 06/09/2010   TRIG 214.0* 06/09/2010   HDL 36.50* 06/09/2010   LDLDIRECT 98.6 06/09/2010   ALT 16 06/09/2010   AST 22 06/09/2010   NA 143 06/09/2010   K 3.5 06/09/2010   CL 106 06/09/2010   CREATININE 0.9 06/09/2010   BUN 19 06/09/2010   CO2 32 06/09/2010   TSH 3.91 06/09/2010   INR 1.0 ratio 03/25/2010

## 2011-08-31 LAB — BASIC METABOLIC PANEL
CO2: 30 mEq/L (ref 19–32)
Calcium: 9.4 mg/dL (ref 8.4–10.5)
Chloride: 104 mEq/L (ref 96–112)
Sodium: 143 mEq/L (ref 135–145)

## 2011-08-31 LAB — LIPID PANEL
Cholesterol: 147 mg/dL (ref 0–200)
HDL: 40.4 mg/dL (ref >39.00)
LDL Cholesterol: 83 mg/dL (ref 0–99)
Total CHOL/HDL Ratio: 4
Triglycerides: 119 mg/dL (ref 0.0–149.0)

## 2011-08-31 LAB — HEPATIC FUNCTION PANEL
Albumin: 4.4 g/dL (ref 3.5–5.2)
Total Protein: 7.3 g/dL (ref 6.0–8.3)

## 2011-09-14 ENCOUNTER — Ambulatory Visit (INDEPENDENT_AMBULATORY_CARE_PROVIDER_SITE_OTHER): Payer: Medicare Other

## 2011-09-14 DIAGNOSIS — Z23 Encounter for immunization: Secondary | ICD-10-CM

## 2011-12-31 ENCOUNTER — Other Ambulatory Visit: Payer: Self-pay | Admitting: Cardiology

## 2012-04-03 ENCOUNTER — Other Ambulatory Visit: Payer: Self-pay | Admitting: Cardiology

## 2012-05-23 ENCOUNTER — Ambulatory Visit (INDEPENDENT_AMBULATORY_CARE_PROVIDER_SITE_OTHER): Payer: Medicare Other | Admitting: Cardiology

## 2012-05-23 ENCOUNTER — Encounter: Payer: Self-pay | Admitting: Cardiology

## 2012-05-23 VITALS — BP 147/73 | HR 55 | Ht 65.0 in | Wt 136.2 lb

## 2012-05-23 DIAGNOSIS — I428 Other cardiomyopathies: Secondary | ICD-10-CM

## 2012-05-23 DIAGNOSIS — R002 Palpitations: Secondary | ICD-10-CM

## 2012-05-23 DIAGNOSIS — I10 Essential (primary) hypertension: Secondary | ICD-10-CM

## 2012-05-23 MED ORDER — FUROSEMIDE 20 MG PO TABS: 20.0000 mg | ORAL_TABLET | ORAL | Status: DC | PRN

## 2012-05-23 NOTE — Patient Instructions (Addendum)
You may take your Furosemide as needed. All other medications should be continued as listed   Follow up in 1 year with Dr Antoine Poche.  You will receive a letter in the mail 2 months before you are due.  Please call us when you receive this letter to schedule your follow up appointment.

## 2012-05-23 NOTE — Progress Notes (Signed)
   HPI The patient presents for followup of her cardiomyopathy. Her EF improved from a low of 15% to 55%.  She is doing very well and she denies any shortness of breath, PND or orthopnea. She has no palpitations, presyncope or syncope. She has no chest pressure, neck or arm discomfort. She said no weight gain or edema. At the last visit I reduced her Lasix.  She did well with this.  She has had no edema.  Allergies  Allergen Reactions  . Amoxicillin     Curam---from [facility]  . Codeine     Current Outpatient Prescriptions  Medication Sig Dispense Refill  . albuterol (PROAIR HFA) 108 (90 BASE) MCG/ACT inhaler Inhale 2 puffs into the lungs every 6 (six) hours as needed.        Marland Kitchen aspirin 81 MG tablet Take 81 mg by mouth daily.        . Calcium Carbonate-Vitamin D (CALCIUM 600+D) 600-400 MG-UNIT per tablet Take 1 tablet by mouth daily.        . carvedilol (COREG) 12.5 MG tablet TAKE 1 TABLET BY MOUTH TWICE A DAY  60 tablet  9  . furosemide (LASIX) 20 MG tablet Take 1 tablet (20 mg total) by mouth daily.  90 tablet  3  . lisinopril (PRINIVIL,ZESTRIL) 10 MG tablet TAKE 1 TABLET EVERY DAY  90 tablet  3  . Multiple Vitamin (MULTIVITAMIN) tablet Take 1 tablet by mouth daily.        . pravastatin (PRAVACHOL) 80 MG tablet TAKE 1 TABLET BY MOUTH AT BEDTIME  30 tablet  4   Current Facility-Administered Medications  Medication Dose Route Frequency Provider Last Rate Last Dose  . pneumococcal 23 valent vaccine (PNU-IMMUNE) injection 0.5 mL  0.5 mL Intramuscular Once Corwin Levins, MD        Past Medical History  Diagnosis Date  . Colonic polyp   . HLD (hyperlipidemia)   . HTN (hypertension)   . Allergic rhinitis   . Glucose intolerance (impaired glucose tolerance)   . Bell's palsy   . DVT (deep venous thrombosis)   . PVD (peripheral vascular disease)   . NICM (nonischemic cardiomyopathy)   . Impaired glucose tolerance 08/26/2011    Past Surgical History  Procedure Date  . Lumbar fusion 1983    . Hysterectomy - unknown type   . Oophorectomy     ROS:  As stated in the HPI and negative for all other systems.  PHYSICAL EXAM BP 147/73  Pulse 55  Ht 5\' 5"  (1.651 m)  Wt 136 lb 3.2 oz (61.78 kg)  BMI 22.66 kg/m2 GENERAL:  Well appearing NECK:  No jugular venous distention, waveform within normal limits, carotid upstroke brisk and symmetric, no bruits, no thyromegaly LUNGS:  Clear to auscultation bilaterally BACK:  No CVA tenderness CHEST:  Unremarkable HEART:  PMI not displaced or sustained,S1 and S2 within normal limits, no S3, no S4, no clicks, no rubs, no murmurs ABD:  Flat, positive bowel sounds normal in frequency in pitch, no bruits, no rebound, no guarding, no midline pulsatile mass, no hepatomegaly, no splenomegaly EXT:  2 plus pulses throughout, no edema, no cyanosis no clubbing  EKG:  Sinus rhythm, rate 57, axis within normal limits, intervals within normal limits, no acute ST-T wave changes.  Poor anterior R wave progression. 05/23/2012   ASSESSMENT AND PLAN

## 2012-05-23 NOTE — Assessment & Plan Note (Signed)
She can stop her Lasix intake as needed. We discussed when she might needed. She should otherwise continue the meds as listed. No further imaging is indicated.

## 2012-05-23 NOTE — Assessment & Plan Note (Signed)
Her blood pressure is slightly high today but it has been labile. She can continue to watch this with a home diary and further adjustments to medications will be based on these.

## 2012-06-07 ENCOUNTER — Other Ambulatory Visit: Payer: Self-pay | Admitting: Gynecology

## 2012-06-07 DIAGNOSIS — Z1231 Encounter for screening mammogram for malignant neoplasm of breast: Secondary | ICD-10-CM

## 2012-06-14 ENCOUNTER — Other Ambulatory Visit: Payer: Self-pay | Admitting: Cardiology

## 2012-06-14 MED ORDER — LISINOPRIL 10 MG PO TABS: 10.0000 mg | ORAL_TABLET | Freq: Every day | ORAL | Status: DC

## 2012-06-14 NOTE — Telephone Encounter (Signed)
New Problem:    Patient called in needing a refill of her lisinopril (PRINIVIL,ZESTRIL) 10 MG tablet.  Please call back, patient will be leaving the country this Saturday.

## 2012-06-14 NOTE — Telephone Encounter (Signed)
F/U   Patient calling back for lisinopril. Clicked button to request meds. Verified preferred pharmacy as [facility] [address]

## 2012-06-28 ENCOUNTER — Ambulatory Visit (INDEPENDENT_AMBULATORY_CARE_PROVIDER_SITE_OTHER): Payer: Medicare Other | Admitting: Internal Medicine

## 2012-06-28 ENCOUNTER — Encounter: Payer: Self-pay | Admitting: Internal Medicine

## 2012-06-28 ENCOUNTER — Telehealth: Payer: Self-pay | Admitting: Internal Medicine

## 2012-06-28 VITALS — BP 110/78 | HR 90 | Temp 100.0°F | Ht 64.5 in | Wt 138.2 lb

## 2012-06-28 DIAGNOSIS — R7302 Impaired glucose tolerance (oral): Secondary | ICD-10-CM

## 2012-06-28 DIAGNOSIS — J209 Acute bronchitis, unspecified: Secondary | ICD-10-CM

## 2012-06-28 DIAGNOSIS — R7309 Other abnormal glucose: Secondary | ICD-10-CM

## 2012-06-28 DIAGNOSIS — R062 Wheezing: Secondary | ICD-10-CM

## 2012-06-28 DIAGNOSIS — I10 Essential (primary) hypertension: Secondary | ICD-10-CM

## 2012-06-28 MED ORDER — HYDROCODONE-HOMATROPINE 5-1.5 MG/5ML PO SYRP: 5.0000 mL | ORAL_SOLUTION | Freq: Four times a day (QID) | ORAL | Status: AC | PRN

## 2012-06-28 MED ORDER — ALBUTEROL SULFATE HFA 108 (90 BASE) MCG/ACT IN AERS: 2.0000 | INHALATION_SPRAY | Freq: Four times a day (QID) | RESPIRATORY_TRACT | Status: DC | PRN

## 2012-06-28 MED ORDER — PREDNISONE 10 MG PO TABS: ORAL_TABLET | ORAL | Status: DC

## 2012-06-28 MED ORDER — AZITHROMYCIN 250 MG PO TABS: ORAL_TABLET | ORAL | Status: AC

## 2012-06-28 NOTE — Assessment & Plan Note (Signed)
Mild to mod, for antibx course,  to f/u any worsening symptoms or concerns 

## 2012-06-28 NOTE — Telephone Encounter (Signed)
Caller: Taraann/Patient; PCP: Oliver Barre; CB#: [phone]; Call regarding Cough/Congestion; Watery eyes, Runny nose, Mild headache, Temp up to 101, Sore throat.  Onset 06/27/12.  Emergent sx ruled out.  See provider within 4 hours per URI protocol.   Appointment with Dr. Jonny Ruiz at [phone].

## 2012-06-28 NOTE — Patient Instructions (Addendum)
Take all new medications as prescribed Continue all other medications as before  

## 2012-06-28 NOTE — Assessment & Plan Note (Signed)
stable overall by hx and exam, most recent data reviewed with pt, and pt to continue medical treatment as before  ; To f/u any polys or cbg > 200 on predpack

## 2012-06-28 NOTE — Assessment & Plan Note (Signed)
stable overall by hx and exam, most recent data reviewed with pt, and pt to continue medical treatment as before BP Readings from Last 3 Encounters:  06/28/12 110/78  05/23/12 147/73  08/30/11 122/70

## 2012-06-28 NOTE — Assessment & Plan Note (Signed)
Mild to mod, for predpack asd,  to f/u any worsening symptoms or concerns 

## 2012-06-30 ENCOUNTER — Encounter: Payer: Self-pay | Admitting: Internal Medicine

## 2012-06-30 NOTE — Progress Notes (Signed)
Subjective:    Patient ID: Carol Lawrence, female    DOB: 1936/01/09, 76 y.o.   MRN: [id number]  HPI  Here with acute onset mild to mod 2-3 days ST, HA, general weakness and malaise, with prod cough greenish sputum, but Pt denies chest pain, increased sob or doe, wheezing, orthopnea, PND, increased LE swelling, palpitations, dizziness or syncope, except for onset wheezing in the past 2 days with sob.  Pt denies new neurological symptoms such as new headache, or facial or extremity weakness or numbness   Pt denies polydipsia, polyuria,   Pt states overall good compliance with meds, trying to follow lower cholesterol diet, wt overall stable.  Just back from [place].   Pt denies fever, wt loss, night sweats, loss of appetite, or other constitutional symptoms except for the above.   Past Medical History  Diagnosis Date  . Colonic polyp   . HLD (hyperlipidemia)   . HTN (hypertension)   . Allergic rhinitis   . Glucose intolerance (impaired glucose tolerance)   . Bell's palsy   . DVT (deep venous thrombosis)   . PVD (peripheral vascular disease)   . NICM (nonischemic cardiomyopathy)   . Impaired glucose tolerance 08/26/2011    15% 2011, 55% 2012   Past Surgical History  Procedure Date  . Lumbar fusion 1983  . Hysterectomy - unknown type   . Oophorectomy     reports that she quit smoking about 31 years ago. She does not have any smokeless tobacco history on file. She reports that she does not drink alcohol. Her drug history not on file. family history includes COPD in her mother and unspecified family member; Coronary artery disease in an unspecified family member; Hyperlipidemia in an unspecified family member; Leukemia in an unspecified family member; Liver cancer in an unspecified family member; Lung cancer in an unspecified family member; Melanoma in an unspecified family member; and Osteoporosis in her mother and unspecified family member. Allergies  Allergen Reactions  . Amoxicillin    Curam---from [facility]  . Codeine    Current Outpatient Prescriptions on File Prior to Visit  Medication Sig Dispense Refill  . albuterol (PROAIR HFA) 108 (90 BASE) MCG/ACT inhaler Inhale 2 puffs into the lungs every 6 (six) hours as needed.  1 Inhaler  11  . aspirin 81 MG tablet Take 81 mg by mouth daily.        . Calcium Carbonate-Vitamin D (CALCIUM 600+D) 600-400 MG-UNIT per tablet Take 1 tablet by mouth daily.        . carvedilol (COREG) 12.5 MG tablet TAKE 1 TABLET BY MOUTH TWICE A DAY  60 tablet  9  . furosemide (LASIX) 20 MG tablet Take 1 tablet (20 mg total) by mouth as needed.  90 tablet  3  . lisinopril (PRINIVIL,ZESTRIL) 10 MG tablet Take 1 tablet (10 mg total) by mouth daily.  90 tablet  3  . Multiple Vitamin (MULTIVITAMIN) tablet Take 1 tablet by mouth daily.        . pravastatin (PRAVACHOL) 80 MG tablet TAKE 1 TABLET BY MOUTH AT BEDTIME  30 tablet  4   Review of Systems Review of Systems  Constitutional: Negative for diaphoresis and unexpected weight change.  HENT: Negative for drooling and tinnitus.   Eyes: Negative for photophobia and visual disturbance.  Respiratory: Negative for choking and stridor.   Gastrointestinal: Negative for vomiting and blood in stool.  Genitourinary: Negative for hematuria and decreased urine volume.  Musculoskeletal: Negative for gait problem.  Skin:  Negative for color change and wound.  Neurological: Negative for tremors and numbness.     Objective:   Physical Exam BP 110/78  Pulse 90  Temp 100 F (37.8 C) (Oral)  Ht 5' 4.5" (1.638 m)  Wt 138 lb 4 oz (62.71 kg)  BMI 23.36 kg/m2  SpO2 94% Physical Exam  VS noted, mild ill appearing Constitutional: Pt appears well-developed and well-nourished.  HENT: Head: Normocephalic.  Right Ear: External ear normal.  Left Ear: External ear normal.  Bilat tm's mild erythema.  Sinus nontender.  Pharynx mild erythema Eyes: Conjunctivae and EOM are normal. Pupils are equal, round, and reactive to  light.  Neck: Normal range of motion. Neck supple.  Cardiovascular: Normal rate and regular rhythm.   Pulmonary/Chest: Effort normal and breath sounds mild decreased with bilat few wheezes Neurological: Pt is alert. Skin: Skin is warm. No erythema.  Psychiatric: Pt behavior is normal. Thought content normal. Not depressed, 1+ nervous    Assessment & Plan:

## 2012-07-02 ENCOUNTER — Ambulatory Visit
Admission: RE | Admit: 2012-07-02 | Discharge: 2012-07-02 | Disposition: A | Discharge status: Home / Self Care | Payer: PRIVATE HEALTH INSURANCE | Source: Ambulatory Visit | Attending: Gynecology | Admitting: Gynecology

## 2012-07-02 DIAGNOSIS — Z1231 Encounter for screening mammogram for malignant neoplasm of breast: Secondary | ICD-10-CM

## 2012-07-30 ENCOUNTER — Other Ambulatory Visit: Payer: Self-pay | Admitting: Internal Medicine

## 2012-07-30 ENCOUNTER — Other Ambulatory Visit: Payer: Self-pay | Admitting: Cardiology

## 2012-07-30 MED ORDER — FUROSEMIDE 20 MG PO TABS: 20.0000 mg | ORAL_TABLET | ORAL | Status: DC | PRN

## 2012-08-31 ENCOUNTER — Other Ambulatory Visit: Payer: Self-pay | Admitting: Cardiology

## 2012-08-31 NOTE — Telephone Encounter (Signed)
..   Requested Prescriptions   Pending Prescriptions Disp Refills  . pravastatin (PRAVACHOL) 80 MG tablet [Pharmacy Med Name: PRAVASTATIN SODIUM 80 MG TAB] 30 tablet 11    Sig: TAKE 1 TABLET BY MOUTH AT BEDTIME

## 2012-09-03 ENCOUNTER — Other Ambulatory Visit (INDEPENDENT_AMBULATORY_CARE_PROVIDER_SITE_OTHER): Payer: PRIVATE HEALTH INSURANCE

## 2012-09-03 DIAGNOSIS — R7302 Impaired glucose tolerance (oral): Secondary | ICD-10-CM

## 2012-09-03 DIAGNOSIS — Z0000 Encounter for general adult medical examination without abnormal findings: Secondary | ICD-10-CM

## 2012-09-03 DIAGNOSIS — I10 Essential (primary) hypertension: Secondary | ICD-10-CM

## 2012-09-03 DIAGNOSIS — E785 Hyperlipidemia, unspecified: Secondary | ICD-10-CM

## 2012-09-03 DIAGNOSIS — R7309 Other abnormal glucose: Secondary | ICD-10-CM

## 2012-09-03 LAB — BASIC METABOLIC PANEL
BUN: 19 mg/dL (ref 6–23)
CO2: 28 mEq/L (ref 19–32)
Calcium: 9.8 mg/dL (ref 8.4–10.5)
Chloride: 108 mEq/L (ref 96–112)
Creatinine, Ser: 1 mg/dL (ref 0.4–1.2)
Glucose, Bld: 98 mg/dL (ref 70–99)

## 2012-09-03 LAB — HEPATIC FUNCTION PANEL
AST: 21 U/L (ref 0–37)
Albumin: 4.1 g/dL (ref 3.5–5.2)
Alkaline Phosphatase: 47 U/L (ref 39–117)
Bilirubin, Direct: 0.1 mg/dL (ref 0.0–0.3)
Total Bilirubin: 0.7 mg/dL (ref 0.3–1.2)

## 2012-09-03 LAB — CBC WITH DIFFERENTIAL/PLATELET
Basophils Absolute: 0 10*3/uL (ref 0.0–0.1)
Eosinophils Absolute: 0.1 10*3/uL (ref 0.0–0.7)
Hemoglobin: 14.6 g/dL (ref 12.0–15.0)
Lymphocytes Relative: 21.1 % (ref 12.0–46.0)
MCHC: 33 g/dL (ref 30.0–36.0)
Monocytes Relative: 9.5 % (ref 3.0–12.0)
Neutro Abs: 3.2 10*3/uL (ref 1.4–7.7)
Neutrophils Relative %: 66.9 % (ref 43.0–77.0)
Platelets: 136 10*3/uL — ABNORMAL LOW (ref 150.0–400.0)
RDW: 14.6 % (ref 11.5–14.6)

## 2012-09-03 LAB — LIPID PANEL
LDL Cholesterol: 76 mg/dL (ref 0–99)
Total CHOL/HDL Ratio: 4
Triglycerides: 135 mg/dL (ref 0.0–149.0)

## 2012-09-03 LAB — URINALYSIS, ROUTINE W REFLEX MICROSCOPIC
Ketones, ur: NEGATIVE
Specific Gravity, Urine: 1.015 (ref 1.000–1.030)
Total Protein, Urine: NEGATIVE
Urine Glucose: NEGATIVE
Urobilinogen, UA: 0.2 (ref 0.0–1.0)

## 2012-09-03 LAB — HEMOGLOBIN A1C: Hgb A1c MFr Bld: 5.8 % (ref 4.6–6.5)

## 2012-09-03 LAB — TSH: TSH: 3.32 u[IU]/mL (ref 0.35–5.50)

## 2012-09-12 ENCOUNTER — Encounter: Payer: Self-pay | Admitting: Internal Medicine

## 2012-09-12 ENCOUNTER — Ambulatory Visit (INDEPENDENT_AMBULATORY_CARE_PROVIDER_SITE_OTHER): Payer: PRIVATE HEALTH INSURANCE | Admitting: Internal Medicine

## 2012-09-12 VITALS — BP 120/68 | HR 62 | Temp 97.8°F | Ht 65.0 in | Wt 136.2 lb

## 2012-09-12 DIAGNOSIS — I6529 Occlusion and stenosis of unspecified carotid artery: Secondary | ICD-10-CM

## 2012-09-12 DIAGNOSIS — Z0000 Encounter for general adult medical examination without abnormal findings: Secondary | ICD-10-CM

## 2012-09-12 HISTORY — DX: Occlusion and stenosis of unspecified carotid artery: I65.29

## 2012-09-12 NOTE — Patient Instructions (Addendum)
Please return with your husband for the flu shot (please make nurse visit appt's at the checkout desk) You had your right ear irrigated of wax today Continue all other medications as before Please have the pharmacy call with any refills you may need. You are otherwise up to date with prevention measures Please continue your efforts at being more active, low cholesterol diet, and weight control. Please keep your appointments with your specialists as you have planned - Dr Antoine Poche Please return in 1 year for your yearly visit, or sooner if needed, with Lab testing done 3-5 days before

## 2012-09-12 NOTE — Progress Notes (Signed)
Subjective:    Patient ID: Carol Lawrence, female    DOB: April 12, 1936, 76 y.o.   MRN: [id number]  HPI  Here for wellness and f/u;  Overall doing ok;  Pt denies CP, worsening SOB, DOE, wheezing, orthopnea, PND, worsening LE edema, palpitations, dizziness or syncope.  Pt denies neurological change such as new Headache, facial or extremity weakness.  Pt denies polydipsia, polyuria, or low sugar symptoms. Pt states overall good compliance with treatment and medications, good tolerability, and trying to follow lower cholesterol diet.  Pt denies worsening depressive symptoms, suicidal ideation or panic. No fever, wt loss, night sweats, loss of appetite, or other constitutional symptoms.  Pt states good ability with ADL's, low fall risk, home safety reviewed and adequate, no significant changes in hearing or vision, and occasionally active with exercise.  Declines flu shot, b/c she wants to return as husband will only get his shot when she does.  No acute complaints.  Very diligent about monitoring her health and watching her diet and wt.  Has some decreased right ear hearing in the past wk, sees cardiology on regular basis. Past Medical History  Diagnosis Date  . Colonic polyp   . HLD (hyperlipidemia)   . HTN (hypertension)   . Allergic rhinitis   . Glucose intolerance (impaired glucose tolerance)   . Bell's palsy   . DVT (deep venous thrombosis)   . PVD (peripheral vascular disease)   . NICM (nonischemic cardiomyopathy)   . Impaired glucose tolerance 08/26/2011    15% 2011, 55% 2012  . Carotid stenosis 09/12/2012    Bilat, mild per [facility] screening    Past Surgical History  Procedure Date  . Lumbar fusion 1983  . Hysterectomy - unknown type   . Oophorectomy     reports that she quit smoking about 31 years ago. She does not have any smokeless tobacco history on file. She reports that she does not drink alcohol. Her drug history not on file. family history includes COPD in her mother and  unspecified family member; Coronary artery disease in an unspecified family member; Hyperlipidemia in an unspecified family member; Leukemia in an unspecified family member; Liver cancer in an unspecified family member; Lung cancer in an unspecified family member; Melanoma in an unspecified family member; and Osteoporosis in her mother and unspecified family member. Allergies  Allergen Reactions  . Amoxicillin     Curam---from [facility]  . Codeine    Current Outpatient Prescriptions on File Prior to Visit  Medication Sig Dispense Refill  . aspirin 81 MG tablet Take 81 mg by mouth daily.        . Calcium Carbonate-Vitamin D (CALCIUM 600+D) 600-400 MG-UNIT per tablet Take 1 tablet by mouth daily.        . carvedilol (COREG) 12.5 MG tablet TAKE 1 TABLET BY MOUTH TWICE A DAY  60 tablet  9  . furosemide (LASIX) 20 MG tablet Take 1 tablet (20 mg total) by mouth as needed.  90 tablet  3  . lisinopril (PRINIVIL,ZESTRIL) 10 MG tablet Take 1 tablet (10 mg total) by mouth daily.  90 tablet  3  . Multiple Vitamin (MULTIVITAMIN) tablet Take 1 tablet by mouth daily.        . pravastatin (PRAVACHOL) 80 MG tablet TAKE 1 TABLET BY MOUTH AT BEDTIME  30 tablet  11  . albuterol (PROAIR HFA) 108 (90 BASE) MCG/ACT inhaler Inhale 2 puffs into the lungs every 6 (six) hours as needed.  1 Inhaler  11  .  predniSONE (DELTASONE) 10 MG tablet 3 tabs by mouth per day for 3 days,2tabs per day for 3 days,1tab per day for 3 days  18 tablet  0   Review of Systems Review of Systems  Constitutional: Negative for diaphoresis, activity change, appetite change and unexpected weight change.  HENT: Negative for hearing loss, ear pain, facial swelling, mouth sores and neck stiffness.   Eyes: Negative for pain, redness and visual disturbance.  Respiratory: Negative for shortness of breath and wheezing.   Cardiovascular: Negative for chest pain and palpitations.  Gastrointestinal: Negative for diarrhea, blood in stool, abdominal  distention and rectal pain.  Genitourinary: Negative for hematuria, flank pain and decreased urine volume.  Musculoskeletal: Negative for myalgias and joint swelling.  Skin: Negative for color change and wound.  Neurological: Negative for syncope and numbness.  Hematological: Negative for adenopathy.  Psychiatric/Behavioral: Negative for hallucinations, self-injury, decreased concentration and agitation.      Objective:   Physical Exam BP 120/68  Pulse 62  Temp 97.8 F (36.6 C) (Oral)  Ht 5\' 5"  (1.651 m)  Wt 136 lb 4 oz (61.803 kg)  BMI 22.67 kg/m2  SpO2 95% Physical Exam  VS noted Constitutional: Pt is oriented to person, place, and time. Appears well-developed and well-nourished.  HENT:  Head: Normocephalic and atraumatic.  Right Ear: External ear normal.  Left Ear: External ear normal.  Nose: Nose normal.  Mouth/Throat: Oropharynx is clear and moist.  Eyes: Conjunctivae and EOM are normal. Pupils are equal, round, and reactive to light.  Neck: Normal range of motion. Neck supple. No JVD present. No tracheal deviation present.  Cardiovascular: Normal rate, regular rhythm, normal heart sounds and intact distal pulses.   Pulmonary/Chest: Effort normal and breath sounds normal.  Abdominal: Soft. Bowel sounds are normal. There is no tenderness.  Musculoskeletal: Normal range of motion. Exhibits no edema.  Lymphadenopathy:  Has no cervical adenopathy.  Neurological: Pt is alert and oriented to person, place, and time. Pt has normal reflexes. No cranial nerve deficit.  Skin: Skin is warm and dry. No rash noted.  Psychiatric:  Has  normal mood and affect. Behavior is normal.     Assessment & Plan:

## 2012-09-12 NOTE — Assessment & Plan Note (Signed)
bilat mild stenosis [facility]; no change from 3 yrs previous, pt declines formal carotid dopplers at this time, would consider next yr, cont asa

## 2012-09-12 NOTE — Assessment & Plan Note (Signed)

## 2012-09-18 ENCOUNTER — Ambulatory Visit (INDEPENDENT_AMBULATORY_CARE_PROVIDER_SITE_OTHER): Payer: PRIVATE HEALTH INSURANCE | Admitting: *Deleted

## 2012-09-18 DIAGNOSIS — Z23 Encounter for immunization: Secondary | ICD-10-CM

## 2012-10-28 ENCOUNTER — Other Ambulatory Visit: Payer: Self-pay | Admitting: Cardiology

## 2012-10-29 ENCOUNTER — Other Ambulatory Visit: Payer: Self-pay | Admitting: Surgery

## 2013-05-10 ENCOUNTER — Ambulatory Visit: Payer: PRIVATE HEALTH INSURANCE | Admitting: Internal Medicine

## 2013-05-10 ENCOUNTER — Ambulatory Visit (INDEPENDENT_AMBULATORY_CARE_PROVIDER_SITE_OTHER)
Admission: RE | Admit: 2013-05-10 | Discharge: 2013-05-10 | Disposition: A | Discharge status: Home / Self Care | Payer: PRIVATE HEALTH INSURANCE | Source: Ambulatory Visit | Attending: Internal Medicine | Admitting: Internal Medicine

## 2013-05-10 ENCOUNTER — Encounter: Payer: Self-pay | Admitting: Internal Medicine

## 2013-05-10 ENCOUNTER — Ambulatory Visit (INDEPENDENT_AMBULATORY_CARE_PROVIDER_SITE_OTHER): Payer: PRIVATE HEALTH INSURANCE | Admitting: Internal Medicine

## 2013-05-10 VITALS — BP 122/64 | HR 77 | Temp 97.2°F | Ht 65.0 in | Wt 135.0 lb

## 2013-05-10 DIAGNOSIS — J209 Acute bronchitis, unspecified: Secondary | ICD-10-CM

## 2013-05-10 MED ORDER — AZITHROMYCIN 250 MG PO TABS: ORAL_TABLET | ORAL | Status: DC

## 2013-05-10 NOTE — Patient Instructions (Signed)

## 2013-05-10 NOTE — Progress Notes (Signed)
HPI  Pt presents to the clinic today with c/o cold symptoms x 4 days. The worst part is the sore throat and dry cough. She does not produce any sputum. She has been running fevers, it was 102 yesterday. She does feel bad. She has been taking Nyquil OTC. She has no history of asthma or COPD. She does have allergies. She has had sick contacts.  Review of Systems      Past Medical History  Diagnosis Date  . Colonic polyp   . HLD (hyperlipidemia)   . HTN (hypertension)   . Allergic rhinitis   . Glucose intolerance (impaired glucose tolerance)   . Bell's palsy   . DVT (deep venous thrombosis)   . PVD (peripheral vascular disease)   . NICM (nonischemic cardiomyopathy)   . Impaired glucose tolerance 08/26/2011    15% 2011, 55% 2012  . Carotid stenosis 09/12/2012    Bilat, mild per [facility] screening     Family History  Problem Relation Age of Onset  . Leukemia    . Coronary artery disease    . Hyperlipidemia    . Lung cancer    . Osteoporosis    . COPD    . Melanoma    . Liver cancer    . COPD Mother   . Osteoporosis Mother     History   Social History  . Marital Status: Married    Spouse Name: N/A    Number of Children: 2  . Years of Education: N/A   Occupational History  . retired     Printmaker   Social History Main Topics  . Smoking status: Former Smoker    Quit date: 12/19/1980  . Smokeless tobacco: Not on file  . Alcohol Use: No  . Drug Use: Not on file  . Sexually Active: Not on file   Other Topics Concern  . Not on file   Social History Narrative  . No narrative on file    Allergies  Allergen Reactions  . Amoxicillin     Curam---from [facility]  . Codeine      Constitutional: Positive headache, fatigue and fever. Denies abrupt weight changes.  HEENT:  Positive sore throat. Denies eye redness, eye pain, pressure behind the eyes, facial pain, nasal congestion, ear pain, ringing in the ears, wax buildup, runny nose or bloody  nose. Respiratory: Positive cough. Denies difficulty breathing or shortness of breath.  Cardiovascular: Denies chest pain, chest tightness, palpitations or swelling in the hands or feet.   No other specific complaints in a complete review of systems (except as listed in HPI above).  Objective:   BP 122/64  Pulse 77  Temp(Src) 97.2 F (36.2 C) (Oral)  Ht 5\' 5"  (1.651 m)  Wt 135 lb (61.236 kg)  BMI 22.47 kg/m2  SpO2 99% Wt Readings from Last 3 Encounters:  05/10/13 135 lb (61.236 kg)  09/12/12 136 lb 4 oz (61.803 kg)  06/28/12 138 lb 4 oz (62.71 kg)     General: Appears her stated age, well developed, well nourished in NAD. HEENT: Head: normal shape and size; Eyes: sclera white, no icterus, conjunctiva pink, PERRLA and EOMs intact; Ears: Tm's gray and intact, normal light reflex; Nose: mucosa pink and moist, septum midline; Throat/Mouth: + PND. Teeth present, mucosa erythematous and moist, no exudate noted, no lesions or ulcerations noted.  Neck: Mild cervical lymphadenopathy. Neck supple, trachea midline. No massses, lumps or thyromegaly present.  Cardiovascular: Normal rate and rhythm. S1,S2 noted.  No murmur,  rubs or gallops noted. No JVD or BLE edema. No carotid bruits noted. Pulmonary/Chest: Normal effort andscattered rhonchi throughout. No respiratory distress. No wheezes, rales or noted.      Assessment & Plan:   Acute bronchitis, new onset with additional workup required:  Get some rest and drink plenty of water Do salt water gargles for the sore throat eRx for Azithromax x 5 days Will check chest xray to r/o pna  RTC as needed or if symptoms persist.

## 2013-05-28 ENCOUNTER — Encounter: Payer: Self-pay | Admitting: Cardiology

## 2013-05-28 ENCOUNTER — Ambulatory Visit (INDEPENDENT_AMBULATORY_CARE_PROVIDER_SITE_OTHER): Payer: PRIVATE HEALTH INSURANCE | Admitting: Cardiology

## 2013-05-28 VITALS — BP 140/60 | HR 60 | Ht 65.0 in | Wt 138.0 lb

## 2013-05-28 DIAGNOSIS — I509 Heart failure, unspecified: Secondary | ICD-10-CM

## 2013-05-28 DIAGNOSIS — I10 Essential (primary) hypertension: Secondary | ICD-10-CM

## 2013-05-28 DIAGNOSIS — I6529 Occlusion and stenosis of unspecified carotid artery: Secondary | ICD-10-CM

## 2013-05-28 DIAGNOSIS — I6523 Occlusion and stenosis of bilateral carotid arteries: Secondary | ICD-10-CM

## 2013-05-28 DIAGNOSIS — I658 Occlusion and stenosis of other precerebral arteries: Secondary | ICD-10-CM

## 2013-05-28 DIAGNOSIS — I428 Other cardiomyopathies: Secondary | ICD-10-CM

## 2013-05-28 NOTE — Patient Instructions (Addendum)
The current medical regimen is effective;  continue present plan and medications.  Follow up in 1 year with Dr Hochrein.  You will receive a letter in the mail 2 months before you are due.  Please call us when you receive this letter to schedule your follow up appointment.  

## 2013-05-28 NOTE — Progress Notes (Signed)
HPI The patient presents for followup of her cardiomyopathy. Her EF improved from a low of 15% to 55%.  She is doing very well and she denies any shortness of breath, PND or orthopnea. She has no palpitations, presyncope or syncope. She has no chest pressure, neck or arm discomfort. She said no weight gain or edema. I tried to reduce the Lasix at the last visit but she didn't feel well with this so she restarted it at previous dose..  She's very active doing things like building fences.   Allergies  Allergen Reactions  . Amoxicillin     Curam---from [facility]  . Codeine     Current Outpatient Prescriptions  Medication Sig Dispense Refill  . aspirin 81 MG tablet Take 81 mg by mouth daily.        Marland Kitchen azithromycin (ZITHROMAX) 250 MG tablet Take 2 tablets today, then 1 tablet daily for 4 days  6 tablet  0  . Calcium Carbonate-Vitamin D (CALCIUM 600+D) 600-400 MG-UNIT per tablet Take 1 tablet by mouth daily.        . carvedilol (COREG) 12.5 MG tablet TAKE 1 TABLET BY MOUTH TWICE A DAY  60 tablet  9  . furosemide (LASIX) 20 MG tablet Take 1 tablet (20 mg total) by mouth as needed.  90 tablet  3  . lisinopril (PRINIVIL,ZESTRIL) 10 MG tablet Take 1 tablet (10 mg total) by mouth daily.  90 tablet  3  . Multiple Vitamin (MULTIVITAMIN) tablet Take 1 tablet by mouth daily.        . pravastatin (PRAVACHOL) 80 MG tablet TAKE 1 TABLET BY MOUTH AT BEDTIME  30 tablet  11   No current facility-administered medications for this visit.    Past Medical History  Diagnosis Date  . Colonic polyp   . HLD (hyperlipidemia)   . HTN (hypertension)   . Allergic rhinitis   . Glucose intolerance (impaired glucose tolerance)   . Bell's palsy   . DVT (deep venous thrombosis)   . PVD (peripheral vascular disease)   . NICM (nonischemic cardiomyopathy)   . Impaired glucose tolerance 08/26/2011    15% 2011, 55% 2012  . Carotid stenosis 09/12/2012    Bilat, mild per [facility] screening     Past Surgical History    Procedure Laterality Date  . Lumbar fusion  1983  . Hysterectomy - unknown type    . Oophorectomy      ROS:  As stated in the HPI and negative for all other systems.  PHYSICAL EXAM BP 140/60  Pulse 60  Ht 5\' 5"  (1.651 m)  Wt 138 lb (62.596 kg)  BMI 22.96 kg/m2 GENERAL:  Well appearing NECK:  No jugular venous distention, waveform within normal limits, carotid upstroke brisk and symmetric, no bruits, no thyromegaly LUNGS:  Clear to auscultation bilaterally BACK:  No CVA tenderness CHEST:  Unremarkable HEART:  PMI not displaced or sustained,S1 and S2 within normal limits, no S3, no S4, no clicks, no rubs, no murmurs ABD:  Flat, positive bowel sounds normal in frequency in pitch, no bruits, no rebound, no guarding, no midline pulsatile mass, no hepatomegaly, no splenomegaly EXT:  2 plus pulses throughout, no edema, no cyanosis no clubbing  EKG:  Sinus rhythm, rate 60, axis within normal limits, intervals within normal limits, no acute ST-T wave changes.  Poor anterior R wave progression. 05/28/2013   ASSESSMENT AND PLAN  CARDIOMYOPATHY: I would have no reason to suspect her ejection fraction to the lower. No change  in therapy is indicated.  HTN:  The blood pressure is at target. No change in medications is indicated. We will continue with therapeutic lifestyle changes (TLC). I

## 2013-05-31 ENCOUNTER — Other Ambulatory Visit: Payer: Self-pay | Admitting: Cardiology

## 2013-07-01 ENCOUNTER — Other Ambulatory Visit: Payer: Self-pay

## 2013-07-03 ENCOUNTER — Other Ambulatory Visit: Payer: Self-pay

## 2013-07-03 MED ORDER — PRAVASTATIN SODIUM 80 MG PO TABS: ORAL_TABLET | ORAL | Status: DC

## 2013-07-03 MED ORDER — CARVEDILOL 12.5 MG PO TABS: ORAL_TABLET | ORAL | Status: DC

## 2013-07-10 ENCOUNTER — Other Ambulatory Visit: Payer: Self-pay

## 2013-07-10 DIAGNOSIS — Z1231 Encounter for screening mammogram for malignant neoplasm of breast: Secondary | ICD-10-CM

## 2013-07-25 ENCOUNTER — Ambulatory Visit
Admission: RE | Admit: 2013-07-25 | Discharge: 2013-07-25 | Disposition: A | Discharge status: Home / Self Care | Payer: Medicare Other | Source: Ambulatory Visit

## 2013-07-25 DIAGNOSIS — Z1231 Encounter for screening mammogram for malignant neoplasm of breast: Secondary | ICD-10-CM

## 2013-08-12 ENCOUNTER — Other Ambulatory Visit: Payer: Self-pay | Admitting: Internal Medicine

## 2013-09-12 ENCOUNTER — Other Ambulatory Visit (INDEPENDENT_AMBULATORY_CARE_PROVIDER_SITE_OTHER): Payer: Medicare Other

## 2013-09-12 DIAGNOSIS — Z0000 Encounter for general adult medical examination without abnormal findings: Secondary | ICD-10-CM

## 2013-09-12 DIAGNOSIS — I10 Essential (primary) hypertension: Secondary | ICD-10-CM

## 2013-09-12 LAB — URINALYSIS, ROUTINE W REFLEX MICROSCOPIC
Leukocytes, UA: NEGATIVE
Nitrite: NEGATIVE
RBC / HPF: NONE SEEN (ref 0–?)
Specific Gravity, Urine: 1.01 (ref 1.000–1.030)
Urobilinogen, UA: 0.2 (ref 0.0–1.0)

## 2013-09-12 LAB — CBC WITH DIFFERENTIAL/PLATELET
Basophils Absolute: 0 10*3/uL (ref 0.0–0.1)
Eosinophils Absolute: 0.1 10*3/uL (ref 0.0–0.7)
Eosinophils Relative: 2 % (ref 0.0–5.0)
Hemoglobin: 15 g/dL (ref 12.0–15.0)
Lymphocytes Relative: 21.3 % (ref 12.0–46.0)
Lymphs Abs: 1.2 10*3/uL (ref 0.7–4.0)
MCHC: 33.7 g/dL (ref 30.0–36.0)
MCV: 87.6 fl (ref 78.0–100.0)
Monocytes Absolute: 0.5 10*3/uL (ref 0.1–1.0)
Neutro Abs: 3.6 10*3/uL (ref 1.4–7.7)
Neutrophils Relative %: 66.4 % (ref 43.0–77.0)
RDW: 14.3 % (ref 11.5–14.6)
WBC: 5.5 10*3/uL (ref 4.5–10.5)

## 2013-09-12 LAB — BASIC METABOLIC PANEL
CO2: 33 mEq/L — ABNORMAL HIGH (ref 19–32)
Chloride: 105 mEq/L (ref 96–112)
Creatinine, Ser: 1.1 mg/dL (ref 0.4–1.2)
Potassium: 3.2 mEq/L — ABNORMAL LOW (ref 3.5–5.1)
Sodium: 143 mEq/L (ref 135–145)

## 2013-09-12 LAB — LIPID PANEL
Cholesterol: 133 mg/dL (ref 0–200)
HDL: 34.5 mg/dL — ABNORMAL LOW (ref >39.00)
LDL Cholesterol: 65 mg/dL (ref 0–99)
Total CHOL/HDL Ratio: 4
Triglycerides: 167 mg/dL — ABNORMAL HIGH (ref 0.0–149.0)
VLDL: 33.4 mg/dL (ref 0.0–40.0)

## 2013-09-12 LAB — HEPATIC FUNCTION PANEL
AST: 20 U/L (ref 0–37)
Albumin: 4.2 g/dL (ref 3.5–5.2)
Alkaline Phosphatase: 50 U/L (ref 39–117)
Total Protein: 7.1 g/dL (ref 6.0–8.3)

## 2013-09-17 ENCOUNTER — Ambulatory Visit (INDEPENDENT_AMBULATORY_CARE_PROVIDER_SITE_OTHER): Payer: Medicare Other | Admitting: Internal Medicine

## 2013-09-17 ENCOUNTER — Encounter: Payer: Self-pay | Admitting: Internal Medicine

## 2013-09-17 VITALS — BP 146/80 | HR 68 | Temp 97.4°F | Ht 65.0 in | Wt 137.4 lb

## 2013-09-17 DIAGNOSIS — S91209A Unspecified open wound of unspecified toe(s) with damage to nail, initial encounter: Secondary | ICD-10-CM

## 2013-09-17 DIAGNOSIS — Z0000 Encounter for general adult medical examination without abnormal findings: Secondary | ICD-10-CM

## 2013-09-17 DIAGNOSIS — S91109A Unspecified open wound of unspecified toe(s) without damage to nail, initial encounter: Secondary | ICD-10-CM

## 2013-09-17 DIAGNOSIS — Z23 Encounter for immunization: Secondary | ICD-10-CM

## 2013-09-17 DIAGNOSIS — I10 Essential (primary) hypertension: Secondary | ICD-10-CM

## 2013-09-17 NOTE — Patient Instructions (Addendum)
You had the Prevnar (pneumonia) shot, and the flu shot Please continue all other medications as before, and refills have been done if requested. Please have the pharmacy call with any other refills you may need. Please continue your efforts at being more active, low cholesterol diet, and weight control. You are otherwise up to date with prevention measures today. Please continue to monitor your blood pressure at home, your goal is to be less than 140/90 Please keep your appointments with your specialists as you have planned  You will be contacted regarding the referral for: podiatry - to see PCC's now  Please remember to sign up for My Chart if you have not done so, as this will be important to you in the future with finding out test results, communicating by private email, and scheduling acute appointments online when needed.  Please return in 1 year for your yearly visit, or sooner if needed, with Lab testing done 3-5 days before

## 2013-09-17 NOTE — Progress Notes (Signed)
Subjective:    Patient ID: Carol Lawrence, female    DOB: [birth year], 77 y.o.   MRN: [id number]  HPI  Here for wellness and f/u;  Overall doing ok;  Pt denies CP, worsening SOB, DOE, wheezing, orthopnea, PND, worsening LE edema, palpitations, dizziness or syncope.  Pt denies neurological change such as new headache, facial or extremity weakness.  Pt denies polydipsia, polyuria, or low sugar symptoms. Pt states overall good compliance with treatment and medications, good tolerability, and has been trying to follow lower cholesterol diet.  Pt denies worsening depressive symptoms, suicidal ideation or panic. No fever, night sweats, wt loss, loss of appetite, or other constitutional symptoms.  Pt states good ability with ADL's, has low fall risk, home safety reviewed and adequate, no other significant changes in hearing or vision, and only occasionally active with exercise, though does plenty around the house. Incidnetly with partially avulsed right great toe nail,hurts quite a bit today., bleeding stopped.  Due for flu shot Past Medical History  Diagnosis Date  . Colonic polyp   . HLD (hyperlipidemia)   . HTN (hypertension)   . Allergic rhinitis   . Glucose intolerance (impaired glucose tolerance)   . Bell's palsy   . DVT (deep venous thrombosis)   . PVD (peripheral vascular disease)   . NICM (nonischemic cardiomyopathy)   . Cardiomyopathy, dilated 08/26/2011    15% 2011, 55% 2012  . Carotid stenosis 09/12/2012    Bilateral, mild [facility] screening   Past Surgical History  Procedure Laterality Date  . Lumbar fusion  1983  . Hysterectomy - unknown type    . Oophorectomy      reports that she quit smoking about 32 years ago. She does not have any smokeless tobacco history on file. She reports that she does not drink alcohol. Her drug history is not on file. family history includes COPD in her mother and another family member; Coronary artery disease in an other family member; Hyperlipidemia  in an other family member; Leukemia in an other family member; Liver cancer in an other family member; Lung cancer in an other family member; Melanoma in an other family member; Osteoporosis in her mother and another family member. Allergies  Allergen Reactions  . Amoxicillin     Curam---from [facility]  . Codeine    Current Outpatient Prescriptions on File Prior to Visit  Medication Sig Dispense Refill  . aspirin 81 MG tablet Take 81 mg by mouth daily.        . Calcium Carbonate-Vitamin D (CALCIUM 600+D) 600-400 MG-UNIT per tablet Take 1 tablet by mouth daily.        . carvedilol (COREG) 12.5 MG tablet TAKE 1 TABLET BY MOUTH TWICE A DAY  60 tablet  11  . furosemide (LASIX) 20 MG tablet Take 1 tablet (20 mg total) by mouth as needed.  90 tablet  3  . furosemide (LASIX) 20 MG tablet TAKE 1 TABLET (20 MG TOTAL) BY MOUTH DAILY.  90 tablet  3  . lisinopril (PRINIVIL,ZESTRIL) 10 MG tablet TAKE 1 TABLET (10 MG TOTAL) BY MOUTH DAILY.  90 tablet  3  . Multiple Vitamin (MULTIVITAMIN) tablet Take 1 tablet by mouth daily.        . pravastatin (PRAVACHOL) 80 MG tablet TAKE 1 TABLET BY MOUTH AT BEDTIME  30 tablet  11   No current facility-administered medications on file prior to visit.   Review of Systems Constitutional: Negative for diaphoresis, activity change, appetite change or unexpected weight change.  HENT: Negative for hearing loss, ear pain, facial swelling, mouth sores and neck stiffness.   Eyes: Negative for pain, redness and visual disturbance.  Respiratory: Negative for shortness of breath and wheezing.   Cardiovascular: Negative for chest pain and palpitations.  Gastrointestinal: Negative for diarrhea, blood in stool, abdominal distention or other pain Genitourinary: Negative for hematuria, flank pain or change in urine volume.  Musculoskeletal: Negative for myalgias and joint swelling.  Skin: Negative for color change and wound.  Neurological: Negative for syncope and numbness. other  than noted Hematological: Negative for adenopathy.  Psychiatric/Behavioral: Negative for hallucinations, self-injury, decreased concentration and agitation.      Objective:   Physical Exam BP 146/80  Pulse 68  Temp(Src) 97.4 F (36.3 C) (Oral)  Ht 5\' 5"  (1.651 m)  Wt 137 lb 6 oz (62.313 kg)  BMI 22.86 kg/m2  SpO2 97% VS noted,  Constitutional: Pt is oriented to person, place, and time. Appears well-developed and well-nourished.  Head: Normocephalic and atraumatic.  Right Ear: External ear normal.  Left Ear: External ear normal.  Nose: Nose normal.  Mouth/Throat: Oropharynx is clear and moist.  Eyes: Conjunctivae and EOM are normal. Pupils are equal, round, and reactive to light.  Neck: Normal range of motion. Neck supple. No JVD present. No tracheal deviation present.  Cardiovascular: Normal rate, regular rhythm, normal heart sounds and intact distal pulses.   Pulmonary/Chest: Effort normal and breath sounds normal.  Abdominal: Soft. Bowel sounds are normal. There is no tenderness. No HSM  Musculoskeletal: Normal range of motion. Exhibits no edema.  Lymphadenopathy:  Has no cervical adenopathy.  Neurological: Pt is alert and oriented to person, place, and time. Pt has normal reflexes. No cranial nerve deficit.  Skin: Skin is warm and dry. No rash noted. Has partially avulsed right great toenail Psychiatric:  Has  normal mood and affect. Behavior is normal.     Assessment & Plan:

## 2013-09-17 NOTE — Assessment & Plan Note (Signed)
Likely situational today, for pain imrpovment, cont current meds, f/u bp at home as she does

## 2013-09-17 NOTE — Assessment & Plan Note (Signed)

## 2013-09-17 NOTE — Addendum Note (Signed)
Addended by: Scharlene Gloss B on: 09/17/2013 01:46 PM   Modules accepted: Orders

## 2013-09-17 NOTE — Assessment & Plan Note (Signed)
Try to refer podiatry today

## 2013-09-24 ENCOUNTER — Telehealth: Payer: Self-pay | Admitting: *Deleted

## 2013-09-24 NOTE — Telephone Encounter (Signed)
Pt asked how long to soak her toe after the toenail was removed on 08/3013.  I told the pt to soak the foot 2 times a day for 4 - 6 weeks and cover with Neosporin bandaid.  I instructed pt to do the last soak of the day at the end of 4 weeks and if area got a dry hard scab could stop soaks and ointment, and to report any signs of infection.  Pt agreed.

## 2014-01-14 ENCOUNTER — Encounter: Payer: Self-pay | Admitting: Internal Medicine

## 2014-01-14 ENCOUNTER — Ambulatory Visit (INDEPENDENT_AMBULATORY_CARE_PROVIDER_SITE_OTHER): Payer: Medicare Other | Admitting: Internal Medicine

## 2014-01-14 ENCOUNTER — Ambulatory Visit (INDEPENDENT_AMBULATORY_CARE_PROVIDER_SITE_OTHER)
Admission: RE | Admit: 2014-01-14 | Discharge: 2014-01-14 | Disposition: A | Discharge status: Home / Self Care | Payer: Medicare Other | Source: Ambulatory Visit | Attending: Internal Medicine | Admitting: Internal Medicine

## 2014-01-14 VITALS — BP 148/78 | HR 86 | Temp 98.5°F | Ht 64.0 in | Wt 140.4 lb

## 2014-01-14 DIAGNOSIS — R05 Cough: Secondary | ICD-10-CM

## 2014-01-14 DIAGNOSIS — R059 Cough, unspecified: Secondary | ICD-10-CM

## 2014-01-14 MED ORDER — HYDROCODONE-HOMATROPINE 5-1.5 MG/5ML PO SYRP: 5.0000 mL | ORAL_SOLUTION | Freq: Four times a day (QID) | ORAL | Status: DC | PRN

## 2014-01-14 NOTE — Assessment & Plan Note (Signed)
ECG reviewed as per emr - nsc from June 2014, etiology unclear, does have mild URI now but would not explain 3 wks of dry cough prior; no obvious allergy or reflux or known copd though does also have some sob ? Related to some anxiety; for cxr today, cough med prn, but also gave dulera 200/5 sample for 2 wks trial, to continue if symptomatically improved as main tx for prob underlying copd

## 2014-01-14 NOTE — Progress Notes (Signed)
Pre-visit discussion using our clinic review tool. No additional management support is needed unless otherwise documented below in the visit note.  

## 2014-01-14 NOTE — Patient Instructions (Signed)
Your EKG was OK today Please go to the XRAY Department in the Basement (go straight as you get off the elevator) for the x-ray testing You will be contacted by phone if any changes need to be made immediately.  Otherwise, you will receive a letter about your results with an explanation, but please check with MyChart first.  Please try the sample Dulera at 2 puffs twice per day  If cough persists, we may need to consider pulmonary referral.

## 2014-01-14 NOTE — Progress Notes (Signed)
Subjective:    Patient ID: Carol Lawrence, female    DOB: 1936/08/07, 78 y.o.   MRN: [id number]  HPI   Here to f/u, with c/o dry cough for 3 wks for no obvious reason, no HA, fever, congestion, post nasal gtt, overt wheezing or reflux, though does mention some sob.  Former smoker, none for 44yrs, no specific hx of copd known.  She thinks may be related to cardiac concerns, as had similar dry cough for 2 mo prior to overt cardiac issues for which she has seen Dr Hochrein/card.  Also on ACE for approx 4 yrs, but no cough with that previously.  Does incidentally have nasal congestion acute onset since church yesterday, but thinks this may be a small cold unrelated.  Pt denies new neurological symptoms such as new headache, or facial or extremity weakness or numbness  Pt denies chest pain, increased sob or doe, wheezing, orthopnea, PND, increased LE swelling, palpitations, dizziness or syncope other than above Past Medical History  Diagnosis Date  . Colonic polyp   . HLD (hyperlipidemia)   . HTN (hypertension)   . Allergic rhinitis   . Glucose intolerance (impaired glucose tolerance)   . Bell's palsy   . DVT (deep venous thrombosis)   . PVD (peripheral vascular disease)   . NICM (nonischemic cardiomyopathy)   . Cardiomyopathy, dilated 08/26/2011    15% 2011, 55% 2012  . Carotid stenosis 09/12/2012    Bilateral, mild [facility] screening   Past Surgical History  Procedure Laterality Date  . Lumbar fusion  1983  . Hysterectomy - unknown type    . Oophorectomy      reports that she quit smoking about 33 years ago. She does not have any smokeless tobacco history on file. She reports that she does not drink alcohol. Her drug history is not on file. family history includes COPD in her mother and another family member; Coronary artery disease in an other family member; Hyperlipidemia in an other family member; Leukemia in an other family member; Liver cancer in an other family member; Lung cancer  in an other family member; Melanoma in an other family member; Osteoporosis in her mother and another family member. Allergies  Allergen Reactions  . Amoxicillin     Curam---from [facility]  . Codeine    Current Outpatient Prescriptions on File Prior to Visit  Medication Sig Dispense Refill  . aspirin 81 MG tablet Take 81 mg by mouth daily.        . Calcium Carbonate-Vitamin D (CALCIUM 600+D) 600-400 MG-UNIT per tablet Take 1 tablet by mouth daily.        . carvedilol (COREG) 12.5 MG tablet TAKE 1 TABLET BY MOUTH TWICE A DAY  60 tablet  11  . furosemide (LASIX) 20 MG tablet Take 1 tablet (20 mg total) by mouth as needed.  90 tablet  3  . furosemide (LASIX) 20 MG tablet TAKE 1 TABLET (20 MG TOTAL) BY MOUTH DAILY.  90 tablet  3  . lisinopril (PRINIVIL,ZESTRIL) 10 MG tablet TAKE 1 TABLET (10 MG TOTAL) BY MOUTH DAILY.  90 tablet  3  . Multiple Vitamin (MULTIVITAMIN) tablet Take 1 tablet by mouth daily.        . pravastatin (PRAVACHOL) 80 MG tablet TAKE 1 TABLET BY MOUTH AT BEDTIME  30 tablet  11   No current facility-administered medications on file prior to visit.   Review of Systems  Constitutional: Negative for unexpected weight change, or unusual diaphoresis  HENT:  Negative for tinnitus.   Eyes: Negative for photophobia and visual disturbance.  Respiratory: Negative for choking and stridor.   Gastrointestinal: Negative for vomiting and blood in stool.  Genitourinary: Negative for hematuria and decreased urine volume.  Musculoskeletal: Negative for acute joint swelling Skin: Negative for color change and wound.  Neurological: Negative for tremors and numbness other than noted  Psychiatric/Behavioral: Negative for decreased concentration or  hyperactivity.       Objective:   Physical Exam BP 148/78  Pulse 86  Temp(Src) 98.5 F (36.9 C) (Oral)  Ht 5\' 4"  (1.626 m)  Wt 140 lb 6 oz (63.674 kg)  BMI 24.08 kg/m2  SpO2 95% VS noted,  Constitutional: Pt appears well-developed and  well-nourished.  HENT: Head: NCAT.  Right Ear: External ear normal.  Left Ear: External ear normal.  Eyes: Conjunctivae and EOM are normal. Pupils are equal, round, and reactive to light.  Bilat tm's with minor erythema.  Max sinus areas non tender.  Pharynx with very mild erythema, no exudate Neck: Normal range of motion. Neck supple.  Cardiovascular: Normal rate and regular rhythm.   Pulmonary/Chest: Effort normal and breath sounds normal.  Abd:  Soft, NT, non-distended, + BS Neurological: Pt is alert. Not confused  Skin: Skin is warm. No erythema.  Psychiatric: Pt behavior is normal. Thought content normal.     Assessment & Plan:

## 2014-01-17 ENCOUNTER — Telehealth: Payer: Self-pay

## 2014-01-17 MED ORDER — AZITHROMYCIN 250 MG PO TABS: ORAL_TABLET | ORAL | Status: DC

## 2014-01-17 NOTE — Telephone Encounter (Signed)
Patient informed rx sent in to pharmacy [facility]

## 2014-01-17 NOTE — Telephone Encounter (Signed)
The patient called and is hoping a zpack can be called in for her. She states she is still coughing and congested.

## 2014-05-16 ENCOUNTER — Encounter: Payer: Self-pay | Admitting: Cardiology

## 2014-06-02 ENCOUNTER — Ambulatory Visit: Payer: Medicare Other | Admitting: Cardiology

## 2014-06-03 ENCOUNTER — Other Ambulatory Visit: Payer: Self-pay | Admitting: *Deleted

## 2014-06-03 MED ORDER — LISINOPRIL 10 MG PO TABS: ORAL_TABLET | ORAL | Status: DC

## 2014-06-18 ENCOUNTER — Other Ambulatory Visit: Payer: Self-pay

## 2014-06-18 DIAGNOSIS — Z1231 Encounter for screening mammogram for malignant neoplasm of breast: Secondary | ICD-10-CM

## 2014-07-14 ENCOUNTER — Ambulatory Visit (INDEPENDENT_AMBULATORY_CARE_PROVIDER_SITE_OTHER): Payer: Medicare Other | Admitting: Cardiology

## 2014-07-14 ENCOUNTER — Encounter: Payer: Self-pay | Admitting: Cardiology

## 2014-07-14 VITALS — BP 140/66 | HR 57 | Ht 65.0 in | Wt 142.0 lb

## 2014-07-14 DIAGNOSIS — I10 Essential (primary) hypertension: Secondary | ICD-10-CM

## 2014-07-14 DIAGNOSIS — I428 Other cardiomyopathies: Secondary | ICD-10-CM

## 2014-07-14 NOTE — Patient Instructions (Signed)
Your physician recommends that you schedule a follow-up appointment in: 18 months with Dr. Hochrein  

## 2014-07-14 NOTE — Progress Notes (Signed)
HPI The patient presents for followup of her cardiomyopathy. Her EF improved from a low of 15% to 55%.  She is doing very well and she denies any shortness of breath, PND or orthopnea. She has no palpitations, presyncope or syncope. She has no chest pressure, neck or arm discomfort. She said no significant weight gain or edema.  She pushes a mower.  She has had no symptoms with activity like this.     Allergies  Allergen Reactions  . Amoxicillin     Curam---from [facility]  . Codeine     Current Outpatient Prescriptions  Medication Sig Dispense Refill  . aspirin 81 MG tablet Take 81 mg by mouth daily.        . Calcium Carbonate-Vitamin D (CALCIUM 600+D) 600-400 MG-UNIT per tablet Take 1 tablet by mouth daily.        . carvedilol (COREG) 12.5 MG tablet TAKE 1 TABLET BY MOUTH TWICE A DAY  60 tablet  11  . furosemide (LASIX) 20 MG tablet Take 1 tablet (20 mg total) by mouth as needed.  90 tablet  3  . lisinopril (PRINIVIL,ZESTRIL) 10 MG tablet TAKE 1 TABLET (10 MG TOTAL) BY MOUTH DAILY.  90 tablet  0  . Multiple Vitamin (MULTIVITAMIN) tablet Take 1 tablet by mouth daily.        . pravastatin (PRAVACHOL) 80 MG tablet TAKE 1 TABLET BY MOUTH AT BEDTIME  30 tablet  11   No current facility-administered medications for this visit.    Past Medical History  Diagnosis Date  . Colonic polyp   . HLD (hyperlipidemia)   . HTN (hypertension)   . Allergic rhinitis   . Glucose intolerance (impaired glucose tolerance)   . Bell's palsy   . DVT (deep venous thrombosis)   . PVD (peripheral vascular disease)   . NICM (nonischemic cardiomyopathy)   . Cardiomyopathy, dilated 08/26/2011    15% 2011, 55% 2012  . Carotid stenosis 09/12/2012    Bilateral, mild [facility] screening    Past Surgical History  Procedure Laterality Date  . Lumbar fusion  1983  . Hysterectomy - unknown type    . Oophorectomy      ROS:  As stated in the HPI and negative for all other systems.  PHYSICAL EXAM BP 140/66   Pulse 57  Ht 5\' 5"  (1.651 m)  Wt 142 lb (64.411 kg)  BMI 23.63 kg/m2 GENERAL:  Well appearing NECK:  No jugular venous distention, waveform within normal limits, carotid upstroke brisk and symmetric, no bruits, no thyromegaly LUNGS:  Clear to auscultation bilaterally BACK:  No CVA tenderness CHEST:  Unremarkable HEART:  PMI not displaced or sustained,S1 and S2 within normal limits, no S3, no S4, no clicks, no rubs, no murmurs ABD:  Flat, positive bowel sounds normal in frequency in pitch, no bruits, no rebound, no guarding, no midline pulsatile mass, no hepatomegaly, no splenomegaly EXT:  2 plus pulses throughout, no edema, no cyanosis no clubbing  EKG:  Sinus rhythm, rate 57, axis within normal limits, intervals within normal limits, no acute ST-T wave changes.  Poor anterior R wave progression. 07/14/2014   ASSESSMENT AND PLAN  CARDIOMYOPATHY: I would have no reason to suspect her ejection fraction to the lower. No change in therapy is indicated.   HTN:  The blood pressure is at target. No change in medications is indicated. We will continue with therapeutic lifestyle changes (TLC). She says that it actually usually runs a little lower at home.  CAROTID STENOSIS:  She had only mild stenosis on a recent church screening by her report.  I will repeat Doppler after the next visit.

## 2014-07-17 ENCOUNTER — Other Ambulatory Visit: Payer: Self-pay

## 2014-07-17 MED ORDER — CARVEDILOL 12.5 MG PO TABS: ORAL_TABLET | ORAL | Status: DC

## 2014-07-28 ENCOUNTER — Ambulatory Visit: Payer: Medicare Other

## 2014-07-30 ENCOUNTER — Ambulatory Visit: Payer: Medicare Other

## 2014-08-01 ENCOUNTER — Other Ambulatory Visit: Payer: Self-pay

## 2014-08-01 ENCOUNTER — Telehealth: Payer: Self-pay | Admitting: Internal Medicine

## 2014-08-01 MED ORDER — PRAVASTATIN SODIUM 80 MG PO TABS: ORAL_TABLET | ORAL | Status: DC

## 2014-08-01 NOTE — Telephone Encounter (Signed)
Patient is requesting refill on provastatin.  Has physical scheduled for 10/21.

## 2014-08-01 NOTE — Telephone Encounter (Signed)
Notified pt sent med to [facility].../lmb

## 2014-08-04 ENCOUNTER — Ambulatory Visit
Admission: RE | Admit: 2014-08-04 | Discharge: 2014-08-04 | Disposition: A | Discharge status: Home / Self Care | Payer: Medicare Other | Source: Ambulatory Visit

## 2014-08-04 DIAGNOSIS — Z1231 Encounter for screening mammogram for malignant neoplasm of breast: Secondary | ICD-10-CM

## 2014-08-04 LAB — HM MAMMOGRAPHY

## 2014-08-27 ENCOUNTER — Other Ambulatory Visit: Payer: Self-pay

## 2014-08-27 MED ORDER — LISINOPRIL 10 MG PO TABS: ORAL_TABLET | ORAL | Status: DC

## 2014-09-09 ENCOUNTER — Ambulatory Visit (INDEPENDENT_AMBULATORY_CARE_PROVIDER_SITE_OTHER): Payer: Medicare Other

## 2014-09-09 DIAGNOSIS — Z23 Encounter for immunization: Secondary | ICD-10-CM

## 2014-09-30 ENCOUNTER — Other Ambulatory Visit (INDEPENDENT_AMBULATORY_CARE_PROVIDER_SITE_OTHER): Payer: Medicare Other

## 2014-09-30 DIAGNOSIS — E785 Hyperlipidemia, unspecified: Secondary | ICD-10-CM

## 2014-09-30 DIAGNOSIS — I10 Essential (primary) hypertension: Secondary | ICD-10-CM

## 2014-09-30 DIAGNOSIS — Z0000 Encounter for general adult medical examination without abnormal findings: Secondary | ICD-10-CM

## 2014-09-30 LAB — CBC WITH DIFFERENTIAL/PLATELET
BASOS PCT: 0.4 % (ref 0.0–3.0)
Basophils Absolute: 0 10*3/uL (ref 0.0–0.1)
Eosinophils Absolute: 0.1 10*3/uL (ref 0.0–0.7)
Eosinophils Relative: 2 % (ref 0.0–5.0)
HEMATOCRIT: 47.3 % — AB (ref 36.0–46.0)
HEMOGLOBIN: 15.5 g/dL — AB (ref 12.0–15.0)
LYMPHS ABS: 1.4 10*3/uL (ref 0.7–4.0)
Lymphocytes Relative: 23.9 % (ref 12.0–46.0)
MCHC: 32.7 g/dL (ref 30.0–36.0)
MCV: 89.4 fl (ref 78.0–100.0)
Monocytes Absolute: 0.5 10*3/uL (ref 0.1–1.0)
Monocytes Relative: 8.6 % (ref 3.0–12.0)
NEUTROS ABS: 3.9 10*3/uL (ref 1.4–7.7)
Neutrophils Relative %: 65.1 % (ref 43.0–77.0)
Platelets: 160 10*3/uL (ref 150.0–400.0)
RBC: 5.29 Mil/uL — ABNORMAL HIGH (ref 3.87–5.11)
RDW: 14.2 % (ref 11.5–15.5)
WBC: 5.9 10*3/uL (ref 4.0–10.5)

## 2014-09-30 LAB — URINALYSIS, ROUTINE W REFLEX MICROSCOPIC
Bilirubin Urine: NEGATIVE
Hgb urine dipstick: NEGATIVE
KETONES UR: NEGATIVE
Leukocytes, UA: NEGATIVE
Nitrite: NEGATIVE
PH: 7 (ref 5.0–8.0)
RBC / HPF: NONE SEEN (ref 0–?)
Specific Gravity, Urine: 1.015 (ref 1.000–1.030)
TOTAL PROTEIN, URINE-UPE24: NEGATIVE
Urine Glucose: NEGATIVE
Urobilinogen, UA: 0.2 (ref 0.0–1.0)

## 2014-09-30 LAB — HEPATIC FUNCTION PANEL
ALBUMIN: 3.7 g/dL (ref 3.5–5.2)
ALK PHOS: 56 U/L (ref 39–117)
ALT: 22 U/L (ref 0–35)
AST: 22 U/L (ref 0–37)
Bilirubin, Direct: 0.1 mg/dL (ref 0.0–0.3)
Total Bilirubin: 0.6 mg/dL (ref 0.2–1.2)
Total Protein: 7.3 g/dL (ref 6.0–8.3)

## 2014-09-30 LAB — LIPID PANEL
CHOLESTEROL: 126 mg/dL (ref 0–200)
HDL: 29 mg/dL — ABNORMAL LOW (ref >39.00)
LDL Cholesterol: 61 mg/dL (ref 0–99)
NonHDL: 97
Total CHOL/HDL Ratio: 4
Triglycerides: 182 mg/dL — ABNORMAL HIGH (ref 0.0–149.0)
VLDL: 36.4 mg/dL (ref 0.0–40.0)

## 2014-09-30 LAB — BASIC METABOLIC PANEL
BUN: 15 mg/dL (ref 6–23)
CALCIUM: 9.7 mg/dL (ref 8.4–10.5)
CO2: 30 mEq/L (ref 19–32)
Chloride: 105 mEq/L (ref 96–112)
Creatinine, Ser: 1.1 mg/dL (ref 0.4–1.2)
GFR: 52.99 mL/min — AB (ref >60.00)
GLUCOSE: 97 mg/dL (ref 70–99)
Potassium: 3.4 mEq/L — ABNORMAL LOW (ref 3.5–5.1)
Sodium: 142 mEq/L (ref 135–145)

## 2014-09-30 LAB — TSH: TSH: 3.98 u[IU]/mL (ref 0.35–4.50)

## 2014-10-08 ENCOUNTER — Encounter: Payer: Self-pay | Admitting: Internal Medicine

## 2014-10-08 ENCOUNTER — Ambulatory Visit (INDEPENDENT_AMBULATORY_CARE_PROVIDER_SITE_OTHER): Payer: Medicare Other | Admitting: Internal Medicine

## 2014-10-08 VITALS — BP 142/82 | HR 75 | Temp 98.4°F | Ht 65.0 in | Wt 141.4 lb

## 2014-10-08 DIAGNOSIS — D751 Secondary polycythemia: Secondary | ICD-10-CM | POA: Insufficient documentation

## 2014-10-08 DIAGNOSIS — Z0000 Encounter for general adult medical examination without abnormal findings: Secondary | ICD-10-CM

## 2014-10-08 NOTE — Assessment & Plan Note (Signed)

## 2014-10-08 NOTE — Patient Instructions (Signed)
Please continue all other medications as before, and refills have been done if requested.  Please have the pharmacy call with any other refills you may need.  Please continue your efforts at being more active, low cholesterol diet, and weight control.  You are otherwise up to date with prevention measures today.  Please keep your appointments with your specialists as you may have planned  Your Lab work was OK today  We will add the Iron panel and Ferritin to your next blood work  Please return in 1 year for your yearly visit, or sooner if needed, with Lab testing done 3-5 days before

## 2014-10-08 NOTE — Progress Notes (Addendum)
Subjective:    Patient ID: Carol Lawrence, female    DOB: 07-31-1936, 78 y.o.   MRN: [id number]  HPI   Here for wellness and f/u;  Overall doing ok;  Pt denies CP, worsening SOB, DOE, wheezing, orthopnea, PND, worsening LE edema, palpitations, dizziness or syncope.  Pt denies neurological change such as new headache, facial or extremity weakness.  Pt denies polydipsia, polyuria, or low sugar symptoms. Pt states overall good compliance with treatment and medications, good tolerability, and has been trying to follow lower cholesterol diet.  Pt denies worsening depressive symptoms, suicidal ideation or panic. No fever, night sweats, wt loss, loss of appetite, or other constitutional symptoms.  Pt states good ability with ADL's, has low fall risk, home safety reviewed and adequate, no other significant changes in hearing or vision, and only occasionally active with exercise.  No complaints Past Medical History  Diagnosis Date  . Colonic polyp   . HLD (hyperlipidemia)   . HTN (hypertension)   . Allergic rhinitis   . Glucose intolerance (impaired glucose tolerance)   . Bell's palsy   . DVT (deep venous thrombosis)   . PVD (peripheral vascular disease)   . NICM (nonischemic cardiomyopathy)   . Cardiomyopathy, dilated 08/26/2011    15% 2011, 55% 2012  . Carotid stenosis 09/12/2012    Bilateral, mild [facility] screening   Past Surgical History  Procedure Laterality Date  . Lumbar fusion  1983  . Hysterectomy - unknown type    . Oophorectomy      reports that she quit smoking about 33 years ago. She does not have any smokeless tobacco history on file. She reports that she does not drink alcohol. Her drug history is not on file. family history includes COPD in her mother and another family member; Coronary artery disease in an other family member; Hyperlipidemia in an other family member; Leukemia in an other family member; Liver cancer in an other family member; Lung cancer in an other family  member; Melanoma in an other family member; Osteoporosis in her mother and another family member. Allergies  Allergen Reactions  . Amoxicillin     Curam---from [facility]  . Codeine    Brother with hemochromatosis Review of Systems Constitutional: Negative for increased diaphoresis, other activity, appetite or other siginficant weight change  HENT: Negative for worsening hearing loss, ear pain, facial swelling, mouth sores and neck stiffness.   Eyes: Negative for other worsening pain, redness or visual disturbance.  Respiratory: Negative for shortness of breath and wheezing.   Cardiovascular: Negative for chest pain and palpitations.  Gastrointestinal: Negative for diarrhea, blood in stool, abdominal distention or other pain Genitourinary: Negative for hematuria, flank pain or change in urine volume.  Musculoskeletal: Negative for myalgias or other joint complaints.  Skin: Negative for color change and wound.  Neurological: Negative for syncope and numbness. other than noted Hematological: Negative for adenopathy. or other swelling Psychiatric/Behavioral: Negative for hallucinations, self-injury, decreased concentration or other worsening agitation.      Objective:   Physical Exam BP 142/82  Pulse 75  Temp(Src) 98.4 F (36.9 C) (Oral)  Ht 5\' 5"  (1.651 m)  Wt 141 lb 6 oz (64.127 kg)  BMI 23.53 kg/m2  SpO2 95% VS noted,  Constitutional: Pt is oriented to person, place, and time. Appears well-developed and well-nourished.  Head: Normocephalic and atraumatic.  Right Ear: External ear normal.  Left Ear: External ear normal.  Nose: Nose normal.  Mouth/Throat: Oropharynx is clear and moist.  Eyes:  Conjunctivae and EOM are normal. Pupils are equal, round, and reactive to light.  Neck: Normal range of motion. Neck supple. No JVD present. No tracheal deviation present.  Cardiovascular: Normal rate, regular rhythm, normal heart sounds and intact distal pulses.   Pulmonary/Chest: Effort  normal and breath sounds without rales or wheezing  Abdominal: Soft. Bowel sounds are normal. NT. No HSM  Musculoskeletal: Normal range of motion. Exhibits no edema.  Lymphadenopathy:  Has no cervical adenopathy.  Neurological: Pt is alert and oriented to person, place, and time. Pt has normal reflexes. No cranial nerve deficit. Motor grossly intact Skin: Skin is warm and dry. No rash noted.  Psychiatric:  Has normal mood and affect. Behavior is normal.     Assessment & Plan:

## 2014-10-08 NOTE — Assessment & Plan Note (Signed)
Minor, brother with hemochromatsosi, for iron/ferritin with next labs

## 2014-10-08 NOTE — Progress Notes (Signed)
Pre visit review using our clinic review tool, if applicable. No additional management support is needed unless otherwise documented below in the visit note. 

## 2014-10-09 ENCOUNTER — Other Ambulatory Visit: Payer: Self-pay

## 2014-10-09 MED ORDER — PRAVASTATIN SODIUM 80 MG PO TABS: ORAL_TABLET | ORAL | Status: DC

## 2014-10-16 ENCOUNTER — Other Ambulatory Visit: Payer: Self-pay | Admitting: Internal Medicine

## 2014-11-18 ENCOUNTER — Ambulatory Visit (INDEPENDENT_AMBULATORY_CARE_PROVIDER_SITE_OTHER): Payer: Medicare Other | Admitting: Internal Medicine

## 2014-11-18 ENCOUNTER — Encounter: Payer: Self-pay | Admitting: Internal Medicine

## 2014-11-18 VITALS — BP 140/72 | HR 78 | Temp 98.2°F | Ht 65.0 in | Wt 144.4 lb

## 2014-11-18 DIAGNOSIS — I10 Essential (primary) hypertension: Secondary | ICD-10-CM

## 2014-11-18 DIAGNOSIS — J029 Acute pharyngitis, unspecified: Secondary | ICD-10-CM

## 2014-11-18 DIAGNOSIS — R7302 Impaired glucose tolerance (oral): Secondary | ICD-10-CM

## 2014-11-18 MED ORDER — AZITHROMYCIN 250 MG PO TABS: ORAL_TABLET | ORAL | Status: DC

## 2014-11-18 NOTE — Progress Notes (Signed)
Subjective:    Patient ID: Carol Lawrence, female    DOB: [birth year], 78 y.o.   MRN: [id number]  HPI  Here with 2-3 days acute onset fever, facial pain, pressure, headache, Sever ST, general weakness and malaise, but pt denies chest pain, wheezing, increased sob or doe, orthopnea, PND, increased LE swelling, palpitations, dizziness or syncope. Pt denies new neurological symptoms such as new headache, or facial or extremity weakness or numbness  Pt denies polydipsia, polyuria, Pt states overall good compliance with meds  Past Medical History  Diagnosis Date  . Colonic polyp   . HLD (hyperlipidemia)   . HTN (hypertension)   . Allergic rhinitis   . Glucose intolerance (impaired glucose tolerance)   . Bell's palsy   . DVT (deep venous thrombosis)   . PVD (peripheral vascular disease)   . NICM (nonischemic cardiomyopathy)   . Cardiomyopathy, dilated 08/26/2011    15% 2011, 55% 2012  . Carotid stenosis 09/12/2012    Bilateral, mild [facility] screening   Past Surgical History  Procedure Laterality Date  . Lumbar fusion  1983  . Hysterectomy - unknown type    . Oophorectomy      reports that she quit smoking about 33 years ago. She does not have any smokeless tobacco history on file. She reports that she does not drink alcohol. Her drug history is not on file. family history includes COPD in her mother and another family member; Coronary artery disease in an other family member; Hyperlipidemia in an other family member; Leukemia in an other family member; Liver cancer in an other family member; Lung cancer in an other family member; Melanoma in an other family member; Osteoporosis in her mother and another family member. Allergies  Allergen Reactions  . Amoxicillin     Curam---from [facility]  . Codeine    Current Outpatient Prescriptions on File Prior to Visit  Medication Sig Dispense Refill  . aspirin 81 MG tablet Take 81 mg by mouth daily.      . Calcium Carbonate-Vitamin D  (CALCIUM 600+D) 600-400 MG-UNIT per tablet Take 1 tablet by mouth daily.      . carvedilol (COREG) 12.5 MG tablet TAKE 1 TABLET BY MOUTH TWICE A DAY 60 tablet 11  . furosemide (LASIX) 20 MG tablet Take 1 tablet (20 mg total) by mouth as needed. 90 tablet 3  . furosemide (LASIX) 20 MG tablet TAKE 1 TABLET (20 MG TOTAL) BY MOUTH DAILY. 90 tablet 2  . lisinopril (PRINIVIL,ZESTRIL) 10 MG tablet TAKE 1 TABLET (10 MG TOTAL) BY MOUTH DAILY. 90 tablet 3  . Multiple Vitamin (MULTIVITAMIN) tablet Take 1 tablet by mouth daily.      . pravastatin (PRAVACHOL) 80 MG tablet TAKE 1 TABLET BY MOUTH AT BEDTIME 90 tablet 3   No current facility-administered medications on file prior to visit.   Review of Systems  Constitutional: Negative for unusual diaphoresis or other sweats  HENT: Negative for ringing in ear Eyes: Negative for double vision or worsening visual disturbance.  Respiratory: Negative for choking and stridor.   Gastrointestinal: Negative for vomiting or other signifcant bowel change Genitourinary: Negative for hematuria or decreased urine volume.  Musculoskeletal: Negative for other MSK pain or swelling Skin: Negative for color change and worsening wound.  Neurological: Negative for tremors and numbness other than noted  Psychiatric/Behavioral: Negative for decreased concentration or agitation other than above       Objective:   Physical Exam BP 140/72 mmHg  Pulse 78  Temp(Src)  98.2 F (36.8 C) (Oral)  Ht 5\' 5"  (1.651 m)  Wt 144 lb 6 oz (65.488 kg)  BMI 24.03 kg/m2  SpO2 93% N/A  VS noted, mild ill Constitutional: Pt appears well-developed, well-nourished.  HENT: Head: NCAT.  Right Ear: External ear normal.  Left Ear: External ear normal.  Eyes: . Pupils are equal, round, and reactive to light. Conjunctivae and EOM are normal Bilat tm's with mild erythema.  Max sinus areas mild tender.  Pharynx with mild erythema, 1+ exudate Neck: Normal range of motion. Neck supple.    Cardiovascular: Normal rate and regular rhythm.   Pulmonary/Chest: Effort normal and breath sounds normal.  Neurological: Pt is alert. Not confused , motor grossly intact Skin: Skin is warm. No rash Psychiatric: Pt behavior is normal. No agitation.      Assessment & Plan:

## 2014-11-18 NOTE — Progress Notes (Signed)
Pre visit review using our clinic review tool, if applicable. No additional management support is needed unless otherwise documented below in the visit note. 

## 2014-11-18 NOTE — Patient Instructions (Signed)
Please take all new medication as prescribed  Please continue all other medications as before, and refills have been done if requested.  Please have the pharmacy call with any other refills you may need.  Please keep your appointments with your specialists as you may have planned     

## 2014-11-23 NOTE — Assessment & Plan Note (Signed)
Mild to mod, for antibx course,  to f/u any worsening symptoms or concerns 

## 2014-11-23 NOTE — Assessment & Plan Note (Signed)
stable overall by history and exam, recent data reviewed with pt, and pt to continue medical treatment as before,  to f/u any worsening symptoms or concerns BP Readings from Last 3 Encounters:  11/18/14 140/72  10/08/14 142/82  07/14/14 140/66

## 2014-11-23 NOTE — Assessment & Plan Note (Signed)
stable overall by history and exam, recent data reviewed with pt, and pt to continue medical treatment as before,  to f/u any worsening symptoms or concerns Lab Results  Component Value Date   HGBA1C 5.8 09/03/2012    

## 2015-03-30 DIAGNOSIS — L814 Other melanin hyperpigmentation: Secondary | ICD-10-CM | POA: Diagnosis not present

## 2015-03-30 DIAGNOSIS — L821 Other seborrheic keratosis: Secondary | ICD-10-CM | POA: Diagnosis not present

## 2015-03-30 DIAGNOSIS — D225 Melanocytic nevi of trunk: Secondary | ICD-10-CM | POA: Diagnosis not present

## 2015-03-30 DIAGNOSIS — L570 Actinic keratosis: Secondary | ICD-10-CM | POA: Diagnosis not present

## 2015-05-04 ENCOUNTER — Ambulatory Visit (INDEPENDENT_AMBULATORY_CARE_PROVIDER_SITE_OTHER): Payer: Self-pay | Admitting: Cardiology

## 2015-05-04 ENCOUNTER — Encounter: Payer: Self-pay | Admitting: Cardiology

## 2015-05-04 VITALS — BP 160/72 | HR 66 | Ht 65.0 in | Wt 145.0 lb

## 2015-05-04 DIAGNOSIS — E878 Other disorders of electrolyte and fluid balance, not elsewhere classified: Secondary | ICD-10-CM

## 2015-05-04 NOTE — Patient Instructions (Signed)
Your physician recommends that you schedule a follow-up appointment in: 6 months with Dr. Percival Spanish  We are ordering a lab test for you to get done

## 2015-05-04 NOTE — Progress Notes (Signed)
HPI The patient presents for followup of her cardiomyopathy. Her EF improved from a low of 15% to 55%.  She had mild nonobstructive disease on cath in 2011. She's been having some episodes of increasing shortness of breath. This happens sporadically with activity such as walking 50 feet on level ground. It doesn't happen every time however. She's had a little more dyspnea with exertion. She denies any chest pressure, neck or arm discomfort. She's had no palpitations, presyncope or syncope. She has had no PND or orthopnea. She's had very mild weight gain slowly over time but no edema. She can still do activities such as push a lawnmower on an embankment though it makes her slightly more short of breath and use to.   Allergies  Allergen Reactions  . Amoxicillin     Curam---from [facility]  . Codeine     Current Outpatient Prescriptions  Medication Sig Dispense Refill  . aspirin 81 MG tablet Take 81 mg by mouth daily.      Marland Kitchen azithromycin (ZITHROMAX Z-PAK) 250 MG tablet Use as directed 6 tablet 1  . Calcium Carbonate-Vitamin D (CALCIUM 600+D) 600-400 MG-UNIT per tablet Take 1 tablet by mouth daily.      . carvedilol (COREG) 12.5 MG tablet TAKE 1 TABLET BY MOUTH TWICE A DAY 60 tablet 11  . furosemide (LASIX) 20 MG tablet Take 1 tablet (20 mg total) by mouth as needed. 90 tablet 3  . furosemide (LASIX) 20 MG tablet TAKE 1 TABLET (20 MG TOTAL) BY MOUTH DAILY. 90 tablet 2  . lisinopril (PRINIVIL,ZESTRIL) 10 MG tablet TAKE 1 TABLET (10 MG TOTAL) BY MOUTH DAILY. 90 tablet 3  . Multiple Vitamin (MULTIVITAMIN) tablet Take 1 tablet by mouth daily.      . pravastatin (PRAVACHOL) 80 MG tablet TAKE 1 TABLET BY MOUTH AT BEDTIME 90 tablet 3   No current facility-administered medications for this visit.    Past Medical History  Diagnosis Date  . Colonic polyp   . HLD (hyperlipidemia)   . HTN (hypertension)   . Allergic rhinitis   . Glucose intolerance (impaired glucose tolerance)   . Bell's palsy   .  DVT (deep venous thrombosis)   . PVD (peripheral vascular disease)   . NICM (nonischemic cardiomyopathy)   . Cardiomyopathy, dilated 08/26/2011    15% 2011, 55% 2012  . Carotid stenosis 09/12/2012    Bilateral, mild [facility] screening    Past Surgical History  Procedure Laterality Date  . Lumbar fusion  1983  . Hysterectomy - unknown type    . Oophorectomy      ROS:  As stated in the HPI and negative for all other systems.  PHYSICAL EXAM BP 160/72 mmHg  Pulse 66  Ht 5\' 5"  (1.651 m)  Wt 145 lb (65.772 kg)  BMI 24.13 kg/m2 GENERAL:  Well appearing NECK:  No jugular venous distention, waveform within normal limits, carotid upstroke brisk and symmetric, no bruits, no thyromegaly LUNGS:  Clear to auscultation bilaterally BACK:  No CVA tenderness CHEST:  Unremarkable HEART:  PMI not displaced or sustained,S1 and S2 within normal limits, no S3, no S4, no clicks, no rubs, no murmurs ABD:  Flat, positive bowel sounds normal in frequency in pitch, no bruits, no rebound, no guarding, no midline pulsatile mass, no hepatomegaly, no splenomegaly EXT:  2 plus pulses throughout, no edema, no cyanosis no clubbing  EKG:  Sinus rhythm, rate 58, axis within normal limits, intervals within normal limits, no acute ST-T wave changes.  Poor anterior R wave progression. 05/04/2015   ASSESSMENT AND PLAN  CARDIOMYOPATHY:  She does have some shortness of breath. I don't strongly suspect that her ejection fraction is low again. I will however order a BNP level. She will also try to correlate her days of breathlessness with her diet to see if these might not beproceeded by a higher salt intake.  HTN:   She is going to keep a blood pressure diary. She may need med changes based on that.  CAROTID STENOSIS:   She's had mild stenosis. No follow-up is indicated at this point.

## 2015-05-05 ENCOUNTER — Encounter: Payer: Self-pay | Admitting: Cardiology

## 2015-05-05 LAB — BRAIN NATRIURETIC PEPTIDE: Brain Natriuretic Peptide: 227.7 pg/mL — ABNORMAL HIGH (ref 0.0–100.0)

## 2015-05-11 ENCOUNTER — Telehealth: Payer: Self-pay | Admitting: Cardiology

## 2015-05-11 ENCOUNTER — Other Ambulatory Visit: Payer: Self-pay | Admitting: *Deleted

## 2015-05-11 DIAGNOSIS — R0600 Dyspnea, unspecified: Secondary | ICD-10-CM

## 2015-05-11 NOTE — Telephone Encounter (Signed)
Pt is calling in wanting to speak with Dr. Rosezella [place] nurse about her SOB. She says the doctor had to do some blood work and she wanted to know if anything was found in her blood work that would effect her breathing. Please call  Thanks

## 2015-05-11 NOTE — Telephone Encounter (Signed)
Pt. Has been called and is aware if the echo Dr. Percival Spanish has requested

## 2015-05-15 NOTE — Telephone Encounter (Signed)
Echo scheduled for 05/19/15

## 2015-05-15 NOTE — Telephone Encounter (Signed)
Can this encounter be closed?

## 2015-05-19 ENCOUNTER — Ambulatory Visit (HOSPITAL_COMMUNITY)
Admission: RE | Admit: 2015-05-19 | Discharge: 2015-05-19 | Disposition: A | Discharge status: Home / Self Care | Payer: Medicare Other | Source: Ambulatory Visit | Attending: Cardiology | Admitting: Cardiology

## 2015-05-19 DIAGNOSIS — R0600 Dyspnea, unspecified: Secondary | ICD-10-CM | POA: Diagnosis not present

## 2015-05-22 ENCOUNTER — Other Ambulatory Visit: Payer: Self-pay | Admitting: *Deleted

## 2015-05-22 MED ORDER — LISINOPRIL 20 MG PO TABS: 20.0000 mg | ORAL_TABLET | Freq: Every day | ORAL | Status: DC

## 2015-05-22 NOTE — Progress Notes (Signed)
Addendum,  I did review the BPs that she sent.  I will increase the lisinopril to 20 mg po daily.  Dispense number [id number] with 3 refills.  Call Ms. Hecker with the results and send results to Cathlean Cower, MD

## 2015-06-24 ENCOUNTER — Encounter: Payer: Self-pay | Admitting: Gastroenterology

## 2015-06-29 ENCOUNTER — Other Ambulatory Visit: Payer: Self-pay

## 2015-06-29 DIAGNOSIS — Z1231 Encounter for screening mammogram for malignant neoplasm of breast: Secondary | ICD-10-CM

## 2015-07-23 ENCOUNTER — Other Ambulatory Visit: Payer: Self-pay | Admitting: Cardiology

## 2015-08-11 ENCOUNTER — Ambulatory Visit
Admission: RE | Admit: 2015-08-11 | Discharge: 2015-08-11 | Disposition: A | Discharge status: Home / Self Care | Payer: Medicare Other | Source: Ambulatory Visit

## 2015-08-11 DIAGNOSIS — Z1231 Encounter for screening mammogram for malignant neoplasm of breast: Secondary | ICD-10-CM | POA: Diagnosis not present

## 2015-08-19 ENCOUNTER — Encounter: Payer: Self-pay | Admitting: Cardiology

## 2015-08-19 ENCOUNTER — Telehealth: Payer: Self-pay | Admitting: Cardiology

## 2015-08-20 NOTE — Telephone Encounter (Signed)
Close encounter 

## 2015-08-26 ENCOUNTER — Other Ambulatory Visit: Payer: Self-pay | Admitting: Obstetrics and Gynecology

## 2015-08-26 DIAGNOSIS — Z01419 Encounter for gynecological examination (general) (routine) without abnormal findings: Secondary | ICD-10-CM | POA: Diagnosis not present

## 2015-08-26 DIAGNOSIS — Z6823 Body mass index (BMI) 23.0-23.9, adult: Secondary | ICD-10-CM | POA: Diagnosis not present

## 2015-08-26 DIAGNOSIS — Z779 Other contact with and (suspected) exposures hazardous to health: Secondary | ICD-10-CM | POA: Diagnosis not present

## 2015-08-27 LAB — CYTOLOGY - PAP

## 2015-09-15 ENCOUNTER — Ambulatory Visit: Payer: Medicare Other

## 2015-09-15 DIAGNOSIS — Z23 Encounter for immunization: Secondary | ICD-10-CM

## 2015-11-05 ENCOUNTER — Ambulatory Visit: Payer: Medicare Other | Admitting: Cardiology

## 2015-11-09 ENCOUNTER — Encounter: Payer: Self-pay | Admitting: Cardiology

## 2015-11-09 ENCOUNTER — Ambulatory Visit (INDEPENDENT_AMBULATORY_CARE_PROVIDER_SITE_OTHER): Payer: Medicare Other | Admitting: Cardiology

## 2015-11-09 VITALS — BP 162/90 | HR 70 | Ht 64.0 in | Wt 138.0 lb

## 2015-11-09 DIAGNOSIS — I10 Essential (primary) hypertension: Secondary | ICD-10-CM | POA: Diagnosis not present

## 2015-11-09 MED ORDER — CARVEDILOL 6.25 MG PO TABS: 6.2500 mg | ORAL_TABLET | Freq: Two times a day (BID) | ORAL | Status: DC

## 2015-11-09 NOTE — Patient Instructions (Signed)
Your physician wants you to follow-up in: 6 Months. You will receive a reminder letter in the mail two months in advance. If you don't receive a letter, please call our office to schedule the follow-up appointment.  Your physician has recommended you make the following change in your medication: Take 6.25 mg of Carvedilol and add to your 12.5 mg to make 18.75 mg twice a day  If you need a refill on your cardiac medications before your next appointment, please call your pharmacy.

## 2015-11-09 NOTE — Progress Notes (Signed)
HPI The patient presents for followup of her cardiomyopathy. Her EF improved from a low of 15% to 55%.  However, her EF was slightly lower at 45% at the last visit.  She had mild nonobstructive disease on cath in 2011. She's was having some episodes of increasing shortness of breath at the last visit.  This is when  I repeated the echo. I did go up on her lisinopril. She reports that she is doing well now. She says her breathing better. The patient denies any new symptoms such as chest discomfort, neck or arm discomfort. There has been no new shortness of breath, PND or orthopnea. There have been no reported palpitations, presyncope or syncope.  She tolerated the increased dose of lisinopril.   Allergies  Allergen Reactions  . Amoxicillin     Curam---from [facility]  . Codeine     Current Outpatient Prescriptions  Medication Sig Dispense Refill  . aspirin 81 MG tablet Take 81 mg by mouth daily.      Marland Kitchen azithromycin (ZITHROMAX Z-PAK) 250 MG tablet Use as directed 6 tablet 1  . Calcium Carbonate-Vitamin D (CALCIUM 600+D) 600-400 MG-UNIT per tablet Take 1 tablet by mouth daily.      . carvedilol (COREG) 12.5 MG tablet TAKE 1 TABLET BY MOUTH TWICE A DAY 60 tablet 11  . furosemide (LASIX) 20 MG tablet TAKE 1 TABLET (20 MG TOTAL) BY MOUTH DAILY. 90 tablet 2  . lisinopril (PRINIVIL,ZESTRIL) 20 MG tablet Take 1 tablet (20 mg total) by mouth daily. 90 tablet 3  . Multiple Vitamin (MULTIVITAMIN) tablet Take 1 tablet by mouth daily.      . pravastatin (PRAVACHOL) 80 MG tablet TAKE 1 TABLET BY MOUTH AT BEDTIME 90 tablet 3   No current facility-administered medications for this visit.    Past Medical History  Diagnosis Date  . Colonic polyp   . HLD (hyperlipidemia)   . HTN (hypertension)   . Allergic rhinitis   . Glucose intolerance (impaired glucose tolerance)   . Bell's palsy   . DVT (deep venous thrombosis) ([place])   . PVD (peripheral vascular disease) ([place])   . NICM (nonischemic  cardiomyopathy) ([place])     Coronaries; left main essentially was nonexistent or very short with  . Cardiomyopathy, dilated ([place]) 08/26/2011    15% 2011, 55% 2012  . Carotid stenosis 09/12/2012    Bilateral, mild [facility] screening    Past Surgical History  Procedure Laterality Date  . Lumbar fusion  1983  . Hysterectomy - unknown type    . Oophorectomy      ROS:  As stated in the HPI and negative for all other systems.  PHYSICAL EXAM BP 162/90 mmHg  Pulse 70  Ht 5\' 4"  (1.626 m)  Wt 138 lb (62.596 kg)  BMI 23.68 kg/m2 GENERAL:  Well appearing NECK:  No jugular venous distention, waveform within normal limits, carotid upstroke brisk and symmetric, no bruits, no thyromegaly LUNGS:  Clear to auscultation bilaterally BACK:  No CVA tenderness CHEST:  Unremarkable HEART:  PMI not displaced or sustained,S1 and S2 within normal limits, no S3, no S4, no clicks, no rubs, no murmurs ABD:  Flat, positive bowel sounds normal in frequency in pitch, no bruits, no rebound, no guarding, no midline pulsatile mass, no hepatomegaly, no splenomegaly EXT:  2 plus pulses throughout, no edema, no cyanosis no clubbing  EKG:  Sinus rhythm, rate 70, axis within normal limits, intervals within normal limits, non specific ST-T wave changes.  Poor anterior  R wave progression. 11/09/2015   ASSESSMENT AND PLAN  CARDIOMYOPATHY:  Her EF remains slightly low but better than previous. Today I will increase her carvedilol to 18.75 mg twice daily.  HTN:   This is being managed in the context of treating his CHF  CAROTID STENOSIS:   She's had mild stenosis. No follow-up is indicated at this point.

## 2015-11-16 ENCOUNTER — Other Ambulatory Visit: Payer: Medicare Other

## 2015-11-17 ENCOUNTER — Telehealth: Payer: Self-pay

## 2015-11-17 DIAGNOSIS — Z0000 Encounter for general adult medical examination without abnormal findings: Secondary | ICD-10-CM

## 2015-11-17 NOTE — Telephone Encounter (Signed)
Pt requests order for labs prior to appt 12.01.2016

## 2015-11-19 ENCOUNTER — Other Ambulatory Visit: Payer: Self-pay | Admitting: Internal Medicine

## 2015-11-19 ENCOUNTER — Other Ambulatory Visit (INDEPENDENT_AMBULATORY_CARE_PROVIDER_SITE_OTHER): Payer: Medicare Other

## 2015-11-19 ENCOUNTER — Encounter: Payer: Self-pay | Admitting: Internal Medicine

## 2015-11-19 ENCOUNTER — Ambulatory Visit (INDEPENDENT_AMBULATORY_CARE_PROVIDER_SITE_OTHER): Payer: Medicare Other | Admitting: Internal Medicine

## 2015-11-19 VITALS — BP 126/72 | HR 64 | Temp 98.5°F | Ht 64.0 in | Wt 138.0 lb

## 2015-11-19 DIAGNOSIS — D751 Secondary polycythemia: Secondary | ICD-10-CM

## 2015-11-19 DIAGNOSIS — Z0000 Encounter for general adult medical examination without abnormal findings: Secondary | ICD-10-CM

## 2015-11-19 DIAGNOSIS — E785 Hyperlipidemia, unspecified: Secondary | ICD-10-CM

## 2015-11-19 DIAGNOSIS — I10 Essential (primary) hypertension: Secondary | ICD-10-CM | POA: Diagnosis not present

## 2015-11-19 LAB — CBC WITH DIFFERENTIAL/PLATELET
BASOS ABS: 0 10*3/uL (ref 0.0–0.1)
Basophils Relative: 0.4 % (ref 0.0–3.0)
EOS ABS: 0.1 10*3/uL (ref 0.0–0.7)
Eosinophils Relative: 2.3 % (ref 0.0–5.0)
HCT: 48.3 % — ABNORMAL HIGH (ref 36.0–46.0)
Hemoglobin: 16 g/dL — ABNORMAL HIGH (ref 12.0–15.0)
LYMPHS ABS: 1.6 10*3/uL (ref 0.7–4.0)
Lymphocytes Relative: 29.5 % (ref 12.0–46.0)
MCHC: 33.1 g/dL (ref 30.0–36.0)
MCV: 88.4 fl (ref 78.0–100.0)
MONO ABS: 0.5 10*3/uL (ref 0.1–1.0)
MONOS PCT: 9.5 % (ref 3.0–12.0)
NEUTROS ABS: 3.2 10*3/uL (ref 1.4–7.7)
NEUTROS PCT: 58.3 % (ref 43.0–77.0)
PLATELETS: 158 10*3/uL (ref 150.0–400.0)
RBC: 5.46 Mil/uL — AB (ref 3.87–5.11)
RDW: 13.6 % (ref 11.5–15.5)
WBC: 5.5 10*3/uL (ref 4.0–10.5)

## 2015-11-19 LAB — URINALYSIS, ROUTINE W REFLEX MICROSCOPIC
BILIRUBIN URINE: NEGATIVE
Hgb urine dipstick: NEGATIVE
KETONES UR: NEGATIVE
LEUKOCYTES UA: NEGATIVE
Nitrite: NEGATIVE
PH: 6.5 (ref 5.0–8.0)
RBC / HPF: NONE SEEN (ref 0–?)
SPECIFIC GRAVITY, URINE: 1.01 (ref 1.000–1.030)
TOTAL PROTEIN, URINE-UPE24: NEGATIVE
URINE GLUCOSE: NEGATIVE
UROBILINOGEN UA: 0.2 (ref 0.0–1.0)

## 2015-11-19 LAB — FERRITIN: Ferritin: 61.8 ng/mL (ref 10.0–291.0)

## 2015-11-19 LAB — IBC PANEL
IRON: 108 ug/dL (ref 42–145)
Saturation Ratios: 27.7 % (ref 20.0–50.0)
Transferrin: 278 mg/dL (ref 212.0–360.0)

## 2015-11-19 LAB — BASIC METABOLIC PANEL
BUN: 22 mg/dL (ref 6–23)
CO2: 31 mEq/L (ref 19–32)
Calcium: 9.8 mg/dL (ref 8.4–10.5)
Chloride: 105 mEq/L (ref 96–112)
Creatinine, Ser: 0.97 mg/dL (ref 0.40–1.20)
GFR: 59.17 mL/min — ABNORMAL LOW (ref >60.00)
Glucose, Bld: 91 mg/dL (ref 70–99)
Potassium: 4.3 mEq/L (ref 3.5–5.1)
Sodium: 142 mEq/L (ref 135–145)

## 2015-11-19 LAB — LIPID PANEL
Cholesterol: 144 mg/dL (ref 0–200)
HDL: 34 mg/dL — ABNORMAL LOW (ref >39.00)
LDL Cholesterol: 80 mg/dL (ref 0–99)
NonHDL: 109.59
Total CHOL/HDL Ratio: 4
Triglycerides: 150 mg/dL — ABNORMAL HIGH (ref 0.0–149.0)
VLDL: 30 mg/dL (ref 0.0–40.0)

## 2015-11-19 LAB — HEPATIC FUNCTION PANEL
ALBUMIN: 4.4 g/dL (ref 3.5–5.2)
ALK PHOS: 64 U/L (ref 39–117)
ALT: 18 U/L (ref 0–35)
AST: 17 U/L (ref 0–37)
BILIRUBIN DIRECT: 0.1 mg/dL (ref 0.0–0.3)
TOTAL PROTEIN: 7.3 g/dL (ref 6.0–8.3)
Total Bilirubin: 0.6 mg/dL (ref 0.2–1.2)

## 2015-11-19 LAB — TSH: TSH: 3.45 u[IU]/mL (ref 0.35–4.50)

## 2015-11-19 MED ORDER — PRAVASTATIN SODIUM 80 MG PO TABS: ORAL_TABLET | ORAL | Status: DC

## 2015-11-19 NOTE — Progress Notes (Signed)
Subjective:    Patient ID: Carol Lawrence, female    DOB: 1936/06/12, 79 y.o.   MRN: [id number]  HPI  Here for wellness and f/u;  Overall doing ok;  Pt denies Chest pain, worsening SOB, DOE, wheezing, orthopnea, PND, worsening LE edema, palpitations, dizziness or syncope.  Pt denies neurological change such as new headache, facial or extremity weakness.  Pt denies polydipsia, polyuria, or low sugar symptoms. Pt states overall good compliance with treatment and medications, good tolerability, and has been trying to follow appropriate diet.  Pt denies worsening depressive symptoms, suicidal ideation or panic. No fever, night sweats, wt loss, loss of appetite, or other constitutional symptoms.  Pt states good ability with ADL's, has low fall risk, home safety reviewed and adequate, no other significant changes in hearing or vision, and only occasionally active with exercise.  No new complaints. No arthritic complaint Past Medical History  Diagnosis Date  . Colonic polyp   . HLD (hyperlipidemia)   . HTN (hypertension)   . Allergic rhinitis   . Glucose intolerance (impaired glucose tolerance)   . Bell's palsy   . DVT (deep venous thrombosis) ([place])   . PVD (peripheral vascular disease) ([place])   . NICM (nonischemic cardiomyopathy) ([place])     Coronaries; left main essentially was nonexistent or very short with  . Cardiomyopathy, dilated ([place]) 08/26/2011    15% 2011, 55% 2012  . Carotid stenosis 09/12/2012    Bilateral, mild Lifeline screening   Past Surgical History  Procedure Laterality Date  . Lumbar fusion  1983  . Hysterectomy - unknown type    . Oophorectomy      reports that she quit smoking about 34 years ago. She does not have any smokeless tobacco history on file. She reports that she does not drink alcohol. Her drug history is not on file. family history includes COPD in her mother and another family member; Coronary artery disease in an other family member; Hyperlipidemia in an  other family member; Leukemia in an other family member; Liver cancer in an other family member; Lung cancer in an other family member; Melanoma in an other family member; Osteoporosis in her mother and another family member. Allergies  Allergen Reactions  . Amoxicillin     Curam---from [place]  . Codeine    Current Outpatient Prescriptions on File Prior to Visit  Medication Sig Dispense Refill  . aspirin 81 MG tablet Take 81 mg by mouth daily.      . Calcium Carbonate-Vitamin D (CALCIUM 600+D) 600-400 MG-UNIT per tablet Take 1 tablet by mouth daily.      . carvedilol (COREG) 12.5 MG tablet TAKE 1 TABLET BY MOUTH TWICE A DAY 60 tablet 11  . carvedilol (COREG) 6.25 MG tablet Take 1 tablet (6.25 mg total) by mouth 2 (two) times daily. 60 tablet 6  . furosemide (LASIX) 20 MG tablet TAKE 1 TABLET (20 MG TOTAL) BY MOUTH DAILY. 90 tablet 2  . lisinopril (PRINIVIL,ZESTRIL) 20 MG tablet Take 1 tablet (20 mg total) by mouth daily. 90 tablet 3  . Multiple Vitamin (MULTIVITAMIN) tablet Take 1 tablet by mouth daily.       No current facility-administered medications on file prior to visit.   Review of Systems Constitutional: Negative for increased diaphoresis, other activity, appetite or siginficant weight change other than noted HENT: Negative for worsening hearing loss, ear pain, facial swelling, mouth sores and neck stiffness.   Eyes: Negative for other worsening pain, redness or visual disturbance.  Respiratory: Negative for shortness of breath and wheezing  Cardiovascular: Negative for chest pain and palpitations.  Gastrointestinal: Negative for diarrhea, blood in stool, abdominal distention or other pain Genitourinary: Negative for hematuria, flank pain or change in urine volume.  Musculoskeletal: Negative for myalgias or other joint complaints.  Skin: Negative for color change and wound or drainage.  Neurological: Negative for syncope and numbness. other than noted Hematological: Negative  for adenopathy. or other swelling Psychiatric/Behavioral: Negative for hallucinations, SI, self-injury, decreased concentration or other worsening agitation.      Objective:   Physical Exam BP 126/72 mmHg  Pulse 64  Temp(Src) 98.5 F (36.9 C) (Oral)  Ht 5\' 4"  (1.626 m)  Wt 138 lb (62.596 kg)  BMI 23.68 kg/m2  SpO2 96% VS noted,  Constitutional: Pt is oriented to person, place, and time. Appears well-developed and well-nourished, in no significant distress Head: Normocephalic and atraumatic.  Right Ear: External ear normal.  Left Ear: External ear normal.  Nose: Nose normal.  Mouth/Throat: Oropharynx is clear and moist.  Eyes: Conjunctivae and EOM are normal. Pupils are equal, round, and reactive to light.  Neck: Normal range of motion. Neck supple. No JVD present. No tracheal deviation present or significant neck LA or mass Cardiovascular: Normal rate, regular rhythm, normal heart sounds and intact distal pulses.   Pulmonary/Chest: Effort normal and breath sounds without rales or wheezing  Abdominal: Soft. Bowel sounds are normal. NT. No HSM  Musculoskeletal: Normal range of motion. Exhibits no edema.  Lymphadenopathy:  Has no cervical adenopathy.  Neurological: Pt is alert and oriented to person, place, and time. Pt has normal reflexes. No cranial nerve deficit. Motor grossly intact Skin: Skin is warm and dry. No rash noted.  Psychiatric:  Has normal mood and affect. Behavior is normal.      Assessment & Plan:

## 2015-11-19 NOTE — Assessment & Plan Note (Signed)
stable overall by history and exam, recent data reviewed with pt, and pt to continue medical treatment as before,  to f/u any worsening symptoms or concerns BP Readings from Last 3 Encounters:  11/19/15 126/72  11/09/15 162/90  05/04/15 160/72   For attention to bun/cr with labs with recent increased ACE

## 2015-11-19 NOTE — Patient Instructions (Signed)

## 2015-11-19 NOTE — Assessment & Plan Note (Signed)

## 2015-11-19 NOTE — Progress Notes (Signed)
Pre visit review using our clinic review tool, if applicable. No additional management support is needed unless otherwise documented below in the visit note. 

## 2015-11-19 NOTE — Assessment & Plan Note (Signed)
Minor, but very concerning to pt , has FH of iron metabolism issue per pt, for ferritin/iron panel

## 2015-11-25 ENCOUNTER — Telehealth: Payer: Self-pay | Admitting: *Deleted

## 2015-11-25 NOTE — Telephone Encounter (Signed)
Left msg on triage stating needing to speak with someone concerning her labs. Called pt back she stated that she received a call from someone md referred her to see a blood doctor, and she was told that she would received lab results in mail. Inform pt per chart md left msg on mychart, but did relay msg on labs. Pt is wanting a copy mail to her, also she states she does not use computer wanting to deactivate mychart. Inform pt will deactivate & mail copy of labs to her address...Johny Chess

## 2015-12-09 ENCOUNTER — Telehealth: Payer: Self-pay | Admitting: Hematology

## 2015-12-09 NOTE — Telephone Encounter (Signed)
S/W PT IN REF TO NP APPT ON 01/06/16@10 :30 REFERRING DR Jenny Reichmann DX-POLYCYTHEMIA

## 2015-12-09 NOTE — Telephone Encounter (Signed)
Called pt left vm in ref to new pt. appt.

## 2016-01-01 ENCOUNTER — Telehealth: Payer: Self-pay | Admitting: Internal Medicine

## 2016-01-01 MED ORDER — FUROSEMIDE 20 MG PO TABS: 20.0000 mg | ORAL_TABLET | Freq: Every day | ORAL | Status: DC

## 2016-01-01 NOTE — Telephone Encounter (Signed)
Pt request refill for  furosemide (LASIX) 20 MG tablet to send to [facility]. Please help, she request this for 3 days now

## 2016-01-06 ENCOUNTER — Ambulatory Visit (HOSPITAL_BASED_OUTPATIENT_CLINIC_OR_DEPARTMENT_OTHER): Payer: Medicare Other

## 2016-01-06 ENCOUNTER — Ambulatory Visit (HOSPITAL_BASED_OUTPATIENT_CLINIC_OR_DEPARTMENT_OTHER): Payer: Medicare Other | Admitting: Hematology

## 2016-01-06 ENCOUNTER — Telehealth: Payer: Self-pay | Admitting: Hematology

## 2016-01-06 ENCOUNTER — Encounter: Payer: Self-pay | Admitting: Hematology

## 2016-01-06 VITALS — BP 162/69 | HR 69 | Temp 98.0°F | Resp 16 | Ht 64.0 in | Wt 140.5 lb

## 2016-01-06 DIAGNOSIS — D751 Secondary polycythemia: Secondary | ICD-10-CM

## 2016-01-06 LAB — CBC & DIFF AND RETIC
BASO%: 0.2 % (ref 0.0–2.0)
Basophils Absolute: 0 10*3/uL (ref 0.0–0.1)
EOS%: 2.2 % (ref 0.0–7.0)
Eosinophils Absolute: 0.1 10*3/uL (ref 0.0–0.5)
HCT: 48.8 % — ABNORMAL HIGH (ref 34.8–46.6)
HGB: 16.4 g/dL — ABNORMAL HIGH (ref 11.6–15.9)
IMMATURE RETIC FRACT: 4.2 % (ref 1.60–10.00)
LYMPH%: 24.3 % (ref 14.0–49.7)
MCH: 30.1 pg (ref 25.1–34.0)
MCHC: 33.6 g/dL (ref 31.5–36.0)
MCV: 89.7 fL (ref 79.5–101.0)
MONO#: 0.4 10*3/uL (ref 0.1–0.9)
MONO%: 9.1 % (ref 0.0–14.0)
NEUT#: 3 10*3/uL (ref 1.5–6.5)
NEUT%: 64.2 % (ref 38.4–76.8)
PLATELETS: 118 10*3/uL — AB (ref 145–400)
RBC: 5.44 10*6/uL (ref 3.70–5.45)
RDW: 13.6 % (ref 11.2–14.5)
Retic %: 0.95 % (ref 0.70–2.10)
Retic Ct Abs: 51.68 10*3/uL (ref 33.70–90.70)
WBC: 4.6 10*3/uL (ref 3.9–10.3)
lymph#: 1.1 10*3/uL (ref 0.9–3.3)

## 2016-01-06 LAB — COMPREHENSIVE METABOLIC PANEL
ALBUMIN: 4.2 g/dL (ref 3.5–5.0)
ALK PHOS: 68 U/L (ref 40–150)
ALT: 22 U/L (ref 0–55)
AST: 20 U/L (ref 5–34)
Anion Gap: 8 mEq/L (ref 3–11)
BILIRUBIN TOTAL: 0.66 mg/dL (ref 0.20–1.20)
BUN: 16.9 mg/dL (ref 7.0–26.0)
CALCIUM: 9.9 mg/dL (ref 8.4–10.4)
CO2: 30 mEq/L — ABNORMAL HIGH (ref 22–29)
Chloride: 107 mEq/L (ref 98–109)
Creatinine: 1 mg/dL (ref 0.6–1.1)
EGFR: 54 mL/min/{1.73_m2} — ABNORMAL LOW (ref >90)
GLUCOSE: 92 mg/dL (ref 70–140)
Potassium: 4.5 mEq/L (ref 3.5–5.1)
SODIUM: 144 meq/L (ref 136–145)
TOTAL PROTEIN: 7.1 g/dL (ref 6.4–8.3)

## 2016-01-06 LAB — CHCC SMEAR

## 2016-01-06 NOTE — Progress Notes (Signed)
[facility]    HEMATOLOGY/ONCOLOGY CONSULTATION NOTE  Date of Service: 01/06/2016  Patient Care Team: Biagio Borg, MD as PCP - General  CHIEF COMPLAINTS/PURPOSE OF CONSULTATION:  Polycythemia  HISTORY OF PRESENTING ILLNESS:   Carol Lawrence is a wonderful 80 y.o. female who has been referred to [place] by Dr .Cathlean Cower, MD  for evaluation and management of Polycythemia.  Patient has a history of hypertension, dyslipidemia, impaired glucose tolerance, trigger DVT related to surgery, dilated cardiomyopathy ? Related to anthracycline chemotherapy vs viral cardiomyopathy (ejection fraction was 15% in 2011 and improved to 55% in 2012), bilateral carotid stenosis, peripheral vascular disease who was referred after if she was noted to have increasing hemoglobin and hematocrit levels. Her hemoglobin/hematocrit was 15/44.5 about 2 years ago and has gradually increased to 16.4/48.8. No evidence of leukocytosis. Noted to have some mild thrombocytopenia at 118k. Patient has no chest pain, dizziness, focal neurological deficits, blurring of vision or other overt signs of hyperviscosity. She has been on a baby aspirin daily. She also is on daily Lasix for her CHF. Denies any overt symptoms suggestive of sleep apnea. No new bone pains, skin rashes, pruritus, abdominal pain, chest pain or focal neurological deficits. Has not acute new concerns.   MEDICAL HISTORY:  Past Medical History  Diagnosis Date  . Colonic polyp   . HLD (hyperlipidemia)   . HTN (hypertension)   . Allergic rhinitis   . Glucose intolerance (impaired glucose tolerance)   . Bell's palsy   . DVT (deep venous thrombosis) ([place])   . PVD (peripheral vascular disease) ([place])   . NICM (nonischemic cardiomyopathy) ([place])     Coronaries; left main essentially was nonexistent or very short with  . Cardiomyopathy, dilated ([place]) 08/26/2011    15% 2011, 55% 2012  . 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 Patient was treated with 9 cycles of adjuvant AIM chemotherapy.  Notes left lower extremity DVT after surgery.   SURGICAL HISTORY: Past Surgical History  Procedure Laterality Date  . Lumbar fusion  1983  . Hysterectomy - unknown type    . Oophorectomy      SOCIAL HISTORY: Social History   Social History  . Marital Status: Married    Spouse Name: N/A  . Number of Children: 2  . Years of Education: N/A   Occupational History  . retired     Manufacturing engineer   Social History Main Topics  . Smoking status: Former Smoker    Quit date: 12/19/1980  . Smokeless tobacco: Not on file  . Alcohol Use: No  . Drug Use: Not on file  . Sexual Activity: Not on file   Other Topics Concern  . Not on file   Social History Narrative    FAMILY HISTORY: Family History  Problem Relation Age of Onset  . Leukemia    . Coronary artery disease    . Hyperlipidemia    . Lung cancer    . Osteoporosis    . COPD    . Melanoma    . Liver cancer    . COPD Mother   . Osteoporosis Mother     sister has CLL and melanoma  Second sister who is 51 has liver cancer and acute leukemia .   ALLERGIES:  is allergic to amoxicillin and codeine.  MEDICATIONS:  Current Outpatient Prescriptions  Medication Sig  Dispense Refill  . aspirin 81 MG tablet Take 81 mg by mouth daily.      . Calcium Carbonate-Vitamin D (CALCIUM 600+D) 600-400 MG-UNIT per tablet Take 1 tablet by mouth daily.      . carvedilol (COREG) 12.5 MG tablet TAKE 1 TABLET BY MOUTH TWICE A DAY 60 tablet 11  . carvedilol (COREG) 6.25 MG tablet Take 1 tablet (6.25 mg total) by mouth 2 (two) times daily. 60 tablet 6  . furosemide (LASIX) 20 MG tablet Take 1 tablet (20 mg total) by mouth daily. 90 tablet 2  . lisinopril (PRINIVIL,ZESTRIL) 20 MG tablet Take 1 tablet (20 mg total) by mouth daily. 90 tablet 3  . Multiple  Vitamin (MULTIVITAMIN) tablet Take 1 tablet by mouth daily.      . pravastatin (PRAVACHOL) 80 MG tablet TAKE 1 TABLET BY MOUTH AT BEDTIME 90 tablet 3   No current facility-administered medications for this visit.    REVIEW OF SYSTEMS:    10 Point review of Systems was done is negative except as noted above.  PHYSICAL EXAMINATION: ECOG PERFORMANCE STATUS: 2 - Symptomatic, <50% confined to bed  . Filed Vitals:   01/06/16 1053  BP: 162/69  Pulse: 69  Temp: 98 F (36.7 C)  Resp: 16   Filed Weights   01/06/16 1053  Weight: 140 lb 8 oz (63.73 kg)   .Body mass index is 24.1 kg/(m^2).  GENERAL:alert, in no acute distress and comfortable SKIN: skin color, texture, turgor are normal, no rashes or significant lesions EYES: normal, conjunctiva are pink and non-injected, sclera clear OROPHARYNX:no exudate, no erythema and lips, buccal mucosa, and tongue normal  NECK: supple, no JVD, thyroid normal size, non-tender, without nodularity LYMPH:  no palpable lymphadenopathy in the cervical, axillary or inguinal LUNGS: clear to auscultation with normal respiratory effort HEART: regular rate & rhythm,  no murmurs and no lower extremity edema ABDOMEN: abdomen soft, non-tender, normoactive bowel sounds , no palpable hepatosplenomegaly. Musculoskeletal: no cyanosis of digits and no clubbing  PSYCH: alert & oriented x 3 with fluent speech NEURO: no focal motor/sensory deficits  LABORATORY DATA:  I have reviewed the data as listed  . CBC Latest Ref Rng 01/06/2016 11/19/2015 09/30/2014  WBC 3.9 - 10.3 10e3/uL 4.6 5.5 5.9  Hemoglobin 11.6 - 15.9 g/dL 16.4(H) 16.0(H) 15.5(H)  Hematocrit 34.8 - 46.6 % 48.8(H) 48.3(H) 47.3(H)  Platelets 145 - 400 10e3/uL 118(L) 158.0 160.0    . CMP Latest Ref Rng 01/06/2016 11/19/2015 09/30/2014  Glucose 70 - 140 mg/dl 92 91 97  BUN 7.0 - 26.0 mg/dL 16.9 22 15   Creatinine 0.6 - 1.1 mg/dL 1.0 0.97 1.1  Sodium 136 - 145 mEq/L 144 142 142  Potassium 3.5 - 5.1  mEq/L 4.5 4.3 3.4(L)  Chloride 96 - 112 mEq/L - 105 105  CO2 22 - 29 mEq/L 30(H) 31 30  Calcium 8.4 - 10.4 mg/dL 9.9 9.8 9.7  Total Protein 6.4 - 8.3 g/dL 7.1 7.3 7.3  Total Bilirubin 0.20 - 1.20 mg/dL 0.66 0.6 0.6  Alkaline Phos 40 - 150 U/L 68 64 56  AST 5 - 34 U/L 20 17 22   ALT 0 - 55 U/L 22 18 22           RADIOGRAPHIC STUDIES: I have personally reviewed the radiological images as listed and agreed with the findings in the report. No results found.  ASSESSMENT & PLAN:   80 year old very pleasant Caucasian female with   #1 Mild polycythemia. Hemoglobin/hematocrit mildly elevated at 16.4/48.8.  Patient's Jak2 V617F mutation and Jak2 Exon 12 mutation studies are neg . This reduces the chance that this is a clonal erythropoietic disorder such as polycythemia vera to less than 1%. Patient has no significant leukocytosis, no hepatosplenomegaly or thrombocytosis to suggest a primary myeloproliferative syndrome. Her mild polycythemia likely represents volume contracted from her diuretics or possibly some element of silent hypoxia with exertion or sleep. Former smoker but quit in 1982. No formal diagnosis of COPD. Plan -No indications for therapeutic phlebotomy at this time. -This is unlikely to be polycythemia vera. -Evaluation and management of secondary etiologies by primary care physician. -Likely factors include hemoconcentration from diuretic use, silent COPD, nocturnal or exertional desaturations. -PCP might consider pulmonary function testing, ambulatory and nocturnal oximetry. -Continue follow-up with primary care physician.  Return to care with Dr. Irene Limbo if any new questions or concerns arise.  All of the patients questions were answered to her apparent satisfaction. The patient knows to call the clinic with any problems, questions or concerns.  I spent 55 minutes counseling the patient face to face. The total time spent in the appointment was 60 minutes and more than 50%  was on counseling and direct patient cares.    Sullivan Lone MD [facility] Hematology/Oncology Physician [facility]  (Office):       [phone] (Work cell):  [phone] (Fax):           [phone]  01/06/2016 11:08 AM

## 2016-01-06 NOTE — Telephone Encounter (Signed)
per pof to sch pt appt-gave pt sch copy of avs

## 2016-01-25 ENCOUNTER — Telehealth: Payer: Self-pay | Admitting: *Deleted

## 2016-01-25 NOTE — Telephone Encounter (Signed)
Vm message from patient requesting lab results related to her polycythemia done on 01/06/16 - to se eif Dr. Irene Limbo could find reason for her elevated HGB. Reviewed CBC with her-she was aware of those results. Would like call back related to JAK-2 studies.

## 2016-02-11 ENCOUNTER — Telehealth: Payer: Self-pay | Admitting: *Deleted

## 2016-02-11 NOTE — Telephone Encounter (Signed)
Called patient back with no answer. Left voicemail to call [place] back to inform her of JAK-2 labs were negative.

## 2016-02-11 NOTE — Telephone Encounter (Signed)
Patient would like results of JAK-2 labs.

## 2016-02-15 ENCOUNTER — Encounter: Payer: Self-pay | Admitting: Gastroenterology

## 2016-04-18 ENCOUNTER — Other Ambulatory Visit: Payer: Self-pay | Admitting: Cardiology

## 2016-04-18 NOTE — Telephone Encounter (Signed)
REFILL 

## 2016-05-06 ENCOUNTER — Ambulatory Visit (INDEPENDENT_AMBULATORY_CARE_PROVIDER_SITE_OTHER): Payer: Medicare Other | Admitting: Internal Medicine

## 2016-05-06 ENCOUNTER — Encounter: Payer: Self-pay | Admitting: Internal Medicine

## 2016-05-06 VITALS — BP 130/78 | HR 77 | Temp 99.1°F | Resp 20 | Wt 138.0 lb

## 2016-05-06 DIAGNOSIS — R7302 Impaired glucose tolerance (oral): Secondary | ICD-10-CM | POA: Diagnosis not present

## 2016-05-06 DIAGNOSIS — J029 Acute pharyngitis, unspecified: Secondary | ICD-10-CM

## 2016-05-06 DIAGNOSIS — I10 Essential (primary) hypertension: Secondary | ICD-10-CM

## 2016-05-06 MED ORDER — AZITHROMYCIN 250 MG PO TABS
ORAL_TABLET | ORAL | Status: DC
Start: 2016-05-06 — End: 2016-05-12

## 2016-05-06 NOTE — Patient Instructions (Signed)
Please take all new medication as prescribed - the antibiotic  You can also take Delsym OTC for cough, and/or Mucinex (or it's generic off brand) for congestion, and tylenol as needed for pain.  Please continue all other medications as before, and refills have been done if requested.  Please have the pharmacy call with any other refills you may need.  Please keep your appointments with your specialists as you may have planned    

## 2016-05-06 NOTE — Progress Notes (Signed)
Pre visit review using our clinic review tool, if applicable. No additional management support is needed unless otherwise documented below in the visit note. 

## 2016-05-06 NOTE — Progress Notes (Signed)
Subjective:    Patient ID: Addison Naegeli Forsman, female    DOB: 05-Apr-1936, 80 y.o.   MRN: [id number]  HPI   Here with 2-3 days acute onset fever, severe ST pain, pressure, headache, general weakness and malaise, and nonprod cough, but pt denies chest pain, wheezing, increased sob or doe, orthopnea, PND, increased LE swelling, palpitations, dizziness or syncope. Pt denies new neurological symptoms such as new headache, or facial or extremity weakness or numbness   Pt denies polydipsia, polyuria.   Pt denies fever, wt loss, night sweats, loss of appetite, or other constitutional symptoms Past Medical History  Diagnosis Date  . Colonic polyp   . HLD (hyperlipidemia)   . HTN (hypertension)   . Allergic rhinitis   . Glucose intolerance (impaired glucose tolerance)   . Bell's palsy   . DVT (deep venous thrombosis) ([place])   . PVD (peripheral vascular disease) ([place])   . NICM (nonischemic cardiomyopathy) ([place])     Coronaries; left main essentially was nonexistent or very short with  . Cardiomyopathy, dilated ([place]) 08/26/2011    15% 2011, 55% 2012  . Carotid stenosis 09/12/2012    Bilateral, mild Lifeline screening   Past Surgical History  Procedure Laterality Date  . Lumbar fusion  1983  . Hysterectomy - unknown type    . Oophorectomy      reports that she quit smoking about 35 years ago. She does not have any smokeless tobacco history on file. She reports that she does not drink alcohol. Her drug history is not on file. family history includes COPD in her mother; Osteoporosis in her mother. Allergies  Allergen Reactions  . Amoxicillin     Curam---from [place]  . Codeine    Current Outpatient Prescriptions on File Prior to Visit  Medication Sig Dispense Refill  . aspirin 81 MG tablet Take 81 mg by mouth daily.      . Calcium Carbonate-Vitamin D (CALCIUM 600+D) 600-400 MG-UNIT per tablet Take 1 tablet by mouth daily.      . carvedilol (COREG) 12.5 MG tablet TAKE 1 TABLET BY MOUTH TWICE A  DAY 60 tablet 11  . carvedilol (COREG) 6.25 MG tablet Take 1 tablet (6.25 mg total) by mouth 2 (two) times daily. 60 tablet 6  . furosemide (LASIX) 20 MG tablet Take 1 tablet (20 mg total) by mouth daily. 90 tablet 2  . lisinopril (PRINIVIL,ZESTRIL) 20 MG tablet Take 1 tablet (20 mg total) by mouth daily. KEEP OV. 90 tablet 0  . Multiple Vitamin (MULTIVITAMIN) tablet Take 1 tablet by mouth daily.      . pravastatin (PRAVACHOL) 80 MG tablet TAKE 1 TABLET BY MOUTH AT BEDTIME 90 tablet 3   No current facility-administered medications on file prior to visit.   Review of Systems All otherwise neg per pt     Objective:   Physical Exam BP 130/78 mmHg  Pulse 77  Temp(Src) 99.1 F (37.3 C) (Oral)  Resp 20  Wt 138 lb (62.596 kg)  SpO2 93% VS noted, mild ill Constitutional: Pt appears in no apparent distress HENT: Head: NCAT.  Right Ear: External ear normal.  Left Ear: External ear normal.  Bilat tm's with mild erythema.  Max sinus areas non tender.  Pharynx with severe erythema, + exudate Eyes: . Pupils are equal, round, and reactive to light. Conjunctivae and EOM are normal Neck: Normal range of motion. Neck supple. with bilat submandib LA tender Cardiovascular: Normal rate and regular rhythm.   Pulmonary/Chest: Effort normal and  breath sounds without rales or wheezing.  Abd:  Soft, NT, ND, + BS Neurological: Pt is alert. Not confused , motor grossly intact Skin: Skin is warm. No rash, no LE edema Psychiatric: Pt behavior is normal. No agitation.      Assessment & Plan:

## 2016-05-07 NOTE — Assessment & Plan Note (Signed)
stable overall by history and exam, recent data reviewed with pt, and pt to continue medical treatment as before,  to f/u any worsening symptoms or concerns Lab Results  Component Value Date   HGBA1C 5.8 09/03/2012    

## 2016-05-07 NOTE — Assessment & Plan Note (Signed)
stable overall by history and exam, recent data reviewed with pt, and pt to continue medical treatment as before,  to f/u any worsening symptoms or concerns BP Readings from Last 3 Encounters:  05/06/16 130/78  01/06/16 162/69  11/19/15 126/72

## 2016-05-07 NOTE — Assessment & Plan Note (Signed)
Mild to mod, for antibx course,  to f/u any worsening symptoms or concerns 

## 2016-05-10 ENCOUNTER — Telehealth: Payer: Self-pay | Admitting: Internal Medicine

## 2016-05-10 NOTE — Telephone Encounter (Signed)
Patient called to advise that she is still having productive phlegm, but other sx have gotten better. She wants to know if we want to refill zpack. Please call the patient to advise

## 2016-05-10 NOTE — Telephone Encounter (Signed)
Please advise 

## 2016-05-10 NOTE — Telephone Encounter (Signed)
If not fever, worsening productive cough, or worsening sob we can watch for now, as the cough often goes on for some time, even a few weeks

## 2016-05-11 NOTE — Progress Notes (Signed)
HPI The patient presents for followup of her cardiomyopathy. Her EF improved from a low of 15% to 55%.  However, her EF was slightly lower at 45% at the last visit.  She had mild nonobstructive disease on cath in 2011.  At the last visit I increased her Coreg to 18.75 mg daily.  She did OK with this.  The patient denies any new symptoms such as chest discomfort, neck or arm discomfort. There has been no new shortness of breath, PND or orthopnea. There have been no reported palpitations, presyncope or syncope.  She is having some URI symptoms.     Allergies  Allergen Reactions  . Amoxicillin     Curam---from [place]  . Codeine     Current Outpatient Prescriptions  Medication Sig Dispense Refill  . aspirin 81 MG tablet Take 81 mg by mouth daily.      . Calcium Carbonate-Vitamin D (CALCIUM 600+D) 600-400 MG-UNIT per tablet Take 1 tablet by mouth daily.      . carvedilol (COREG) 12.5 MG tablet TAKE 1 TABLET BY MOUTH TWICE A DAY 60 tablet 11  . carvedilol (COREG) 6.25 MG tablet Take 1 tablet (6.25 mg total) by mouth 2 (two) times daily. 60 tablet 6  . furosemide (LASIX) 20 MG tablet Take 1 tablet (20 mg total) by mouth daily. 90 tablet 2  . lisinopril (PRINIVIL,ZESTRIL) 20 MG tablet Take 1 tablet (20 mg total) by mouth daily. KEEP OV. 90 tablet 0  . Multiple Vitamin (MULTIVITAMIN) tablet Take 1 tablet by mouth daily.      . pravastatin (PRAVACHOL) 80 MG tablet TAKE 1 TABLET BY MOUTH AT BEDTIME 90 tablet 3   No current facility-administered medications for this visit.    Past Medical History  Diagnosis Date  . Colonic polyp   . HLD (hyperlipidemia)   . HTN (hypertension)   . Allergic rhinitis   . Glucose intolerance (impaired glucose tolerance)   . Bell's palsy   . DVT (deep venous thrombosis) ([place])   . PVD (peripheral vascular disease) ([place])   . NICM (nonischemic cardiomyopathy) ([place])     Coronaries; left main essentially was nonexistent or very short with  . Cardiomyopathy,  dilated ([place]) 08/26/2011    15% 2011, 55% 2012  . Carotid stenosis 09/12/2012    Bilateral, mild Lifeline screening    Past Surgical History  Procedure Laterality Date  . Lumbar fusion  1983  . Hysterectomy - unknown type    . Oophorectomy      ROS:  As stated in the HPI and negative for all other systems.  PHYSICAL EXAM BP 146/66 mmHg  Pulse 64  Ht 5\' 5"  (1.651 m)  Wt 138 lb (62.596 kg)  BMI 22.96 kg/m2 GENERAL:  Well appearing NECK:  No jugular venous distention, waveform within normal limits, carotid upstroke brisk and symmetric, no bruits, no thyromegaly LUNGS:  Clear to auscultation bilaterally BACK:  No CVA tenderness CHEST:  Unremarkable HEART:  PMI not displaced or sustained,S1 and S2 within normal limits, no S3, no S4, no clicks, no rubs, no murmurs ABD:  Flat, positive bowel sounds normal in frequency in pitch, no bruits, no rebound, no guarding, no midline pulsatile mass, no hepatomegaly, no splenomegaly EXT:  2 plus pulses throughout, no edema, no cyanosis no clubbing   ASSESSMENT AND PLAN  CARDIOMYOPATHY:  Her EF remains slightly low but better than previous. Today I will increase her carvedilol to 25 mg twice daily.  HTN:   This is being  managed in the context of treating his CHF  CAROTID STENOSIS:   She's had mild stenosis. No follow-up is indicated at this point.

## 2016-05-12 ENCOUNTER — Ambulatory Visit (INDEPENDENT_AMBULATORY_CARE_PROVIDER_SITE_OTHER): Payer: Medicare Other | Admitting: Cardiology

## 2016-05-12 ENCOUNTER — Encounter: Payer: Self-pay | Admitting: Cardiology

## 2016-05-12 VITALS — BP 146/66 | HR 64 | Ht 65.0 in | Wt 138.0 lb

## 2016-05-12 DIAGNOSIS — I420 Dilated cardiomyopathy: Secondary | ICD-10-CM | POA: Diagnosis not present

## 2016-05-12 MED ORDER — CARVEDILOL 25 MG PO TABS: 25.0000 mg | ORAL_TABLET | Freq: Two times a day (BID) | ORAL | Status: DC

## 2016-05-12 NOTE — Patient Instructions (Signed)
Medication Instructions:  Increase Carvedilol 25 mg twice a day  Labwork: NONE  Testing/Procedures: NONE  Follow-Up: Your physician wants you to follow-up in: 6 Months. You will receive a reminder letter in the mail two months in advance. If you don't receive a letter, please call our office to schedule the follow-up appointment.   Any Other Special Instructions Will Be Listed Below (If Applicable).   If you need a refill on your cardiac medications before your next appointment, please call your pharmacy.

## 2016-05-17 ENCOUNTER — Telehealth: Payer: Self-pay | Admitting: Internal Medicine

## 2016-05-17 NOTE — Telephone Encounter (Signed)
Patient Name: Carol Lawrence  DOB: 01/03/1936    Initial Comment Caller states she has laryngitis, she had a cough first.   Nurse Assessment  Nurse: Thad Ranger, RN, Denise Date/Time (Eastern Time): 05/17/2016 1:52:09 PM  Confirm and document reason for call. If symptomatic, describe symptoms. You must click the next button to save text entered. ---Caller states she has laryngitis and a NP cough. No fever.  Has the patient traveled out of the country within the last 30 days? ---Not Applicable  Does the patient have any new or worsening symptoms? ---Yes  Will a triage be completed? ---Yes  Related visit to physician within the last 2 weeks? ---Yes  Does the PT have any chronic conditions? (i.e. diabetes, asthma, etc.) ---No  Is this a behavioral health or substance abuse call? ---No     Guidelines    Guideline Title Affirmed Question Affirmed Notes  Hoarseness Mild hoarseness (all triage questions negative)    Final Disposition User   [place], RN, Langley Gauss    Disagree/Comply: Comply

## 2016-05-23 ENCOUNTER — Ambulatory Visit (INDEPENDENT_AMBULATORY_CARE_PROVIDER_SITE_OTHER): Payer: Medicare Other | Admitting: Family

## 2016-05-23 ENCOUNTER — Encounter: Payer: Self-pay | Admitting: Family

## 2016-05-23 ENCOUNTER — Ambulatory Visit (INDEPENDENT_AMBULATORY_CARE_PROVIDER_SITE_OTHER)
Admission: RE | Admit: 2016-05-23 | Discharge: 2016-05-23 | Disposition: A | Discharge status: Home / Self Care | Payer: Medicare Other | Source: Ambulatory Visit | Attending: Family | Admitting: Family

## 2016-05-23 VITALS — BP 140/60 | HR 59 | Temp 98.0°F | Ht 65.0 in | Wt 142.0 lb

## 2016-05-23 DIAGNOSIS — R05 Cough: Secondary | ICD-10-CM

## 2016-05-23 DIAGNOSIS — R059 Cough, unspecified: Secondary | ICD-10-CM

## 2016-05-23 MED ORDER — PROMETHAZINE-DM 6.25-15 MG/5ML PO SYRP: 5.0000 mL | ORAL_SOLUTION | Freq: Every evening | ORAL | Status: DC | PRN

## 2016-05-23 NOTE — Patient Instructions (Signed)
Hang in there :)   Please take cough medication at night only as needed. As we discussed, I do not recommend dosing throughout the day as coughing is a protective mechanism . It also helps to break up thick mucous.  Do not take cough suppressants with alcohol as can lead to trouble breathing. Advise caution if taking cough suppressant and operating machinery ( i.e driving a car) as you may feel very tired.    Increase intake of clear fluids. Congestion is best treated by hydration, when mucus is wetter, it is thinner, less sticky, and easier to expel from the body, either through coughing up drainage, or by blowing your nose.   Get plenty of rest.   Use saline nasal drops and blow your nose frequently. Run a humidifier at night and elevate the head of the bed. Vicks Vapor rub will help with congestion and cough. Steam showers and sinus massage for congestion.   Use Acetaminophen or Ibuprofen as needed for fever or pain. Avoid second hand smoke. Even the smallest exposure will worsen symptoms.   You can also try a teaspoon of honey to see if this will help reduce cough. Throat lozenges can sometimes be beneficial as well.    This illness will typically last 7 - 10 days.   Please follow up with our clinic if you develop a fever greater than 101 F, symptoms worsen, or do not resolve in the next week.

## 2016-05-23 NOTE — Progress Notes (Signed)
Pre visit review using our clinic review tool, if applicable. No additional management support is needed unless otherwise documented below in the visit note. 

## 2016-05-23 NOTE — Progress Notes (Signed)
Subjective:    Patient ID: Carol Lawrence, female    DOB: Jan 11, 1936, 80 y.o.   MRN: [id number]   Carol Lawrence is a 80 y.o. female who presents today for an acute visit.    HPI Comments: Patient here for evaluation of cough and loss of voice for couple of weeks, improving. Saw PCP 5/19 and given z pack, with some relief.  H/o seasonal allergies. No asthma, COPD. H/o cardiomyopathy however EF has improved, believes it was from chemo years ago. Saw cardiologist last week.   Sore Throat  This is a recurrent problem. The current episode started 1 to 4 weeks ago. The problem has been gradually improving. Neither side of throat is experiencing more pain than the other. The patient is experiencing no pain. Associated symptoms include coughing and ear pain (right one stopped up). Pertinent negatives include no abdominal pain, congestion, ear discharge, shortness of breath, trouble swallowing or vomiting. Treatments tried: zpack, Nyquil. The treatment provided mild relief.   Past Medical History  Diagnosis Date  . Colonic polyp   . HLD (hyperlipidemia)   . HTN (hypertension)   . Allergic rhinitis   . Glucose intolerance (impaired glucose tolerance)   . Bell's palsy   . DVT (deep venous thrombosis) ([place])   . PVD (peripheral vascular disease) ([place])   . NICM (nonischemic cardiomyopathy) ([place])     Coronaries; left main essentially was nonexistent or very short with  . Cardiomyopathy, dilated ([place]) 08/26/2011    15% 2011, 55% 2012  . Carotid stenosis 09/12/2012    Bilateral, mild Lifeline screening   Allergies: Amoxicillin and Codeine Current Outpatient Prescriptions on File Prior to Visit  Medication Sig Dispense Refill  . aspirin 81 MG tablet Take 81 mg by mouth daily.      . Calcium Carbonate-Vitamin D (CALCIUM 600+D) 600-400 MG-UNIT per tablet Take 1 tablet by mouth daily.      . carvedilol (COREG) 25 MG tablet Take 1 tablet (25 mg total) by mouth 2 (two) times daily. 60 tablet 11    . furosemide (LASIX) 20 MG tablet Take 1 tablet (20 mg total) by mouth daily. 90 tablet 2  . lisinopril (PRINIVIL,ZESTRIL) 20 MG tablet Take 1 tablet (20 mg total) by mouth daily. KEEP OV. 90 tablet 0  . Multiple Vitamin (MULTIVITAMIN) tablet Take 1 tablet by mouth daily.      . pravastatin (PRAVACHOL) 80 MG tablet TAKE 1 TABLET BY MOUTH AT BEDTIME 90 tablet 3   No current facility-administered medications on file prior to visit.    Social History  Substance Use Topics  . Smoking status: Former Smoker    Quit date: 12/19/1980  . Smokeless tobacco: None  . Alcohol Use: No    Review of Systems  Constitutional: Negative for fever and chills.  HENT: Positive for ear pain (right one stopped up), sore throat and voice change. Negative for congestion, ear discharge, rhinorrhea, sinus pressure and trouble swallowing.   Respiratory: Positive for cough. Negative for shortness of breath and wheezing.   Cardiovascular: Negative for chest pain and palpitations.  Gastrointestinal: Negative for nausea, vomiting and abdominal pain.      Objective:    BP 140/60 mmHg  Pulse 59  Temp(Src) 98 F (36.7 C) (Oral)  Ht 5\' 5"  (1.651 m)  Wt 142 lb (64.411 kg)  BMI 23.63 kg/m2  SpO2 98%   Physical Exam  Constitutional: She appears well-developed and well-nourished.  HENT:  Head: Normocephalic and atraumatic.  Right Ear:  Hearing, tympanic membrane, external ear and ear canal normal. No drainage, swelling or tenderness. No foreign bodies. Tympanic membrane is not erythematous and not bulging. No middle ear effusion. No decreased hearing is noted.  Left Ear: Hearing, tympanic membrane, external ear and ear canal normal. No drainage, swelling or tenderness. No foreign bodies. Tympanic membrane is not erythematous and not bulging.  No middle ear effusion. No decreased hearing is noted.  Nose: Nose normal. No rhinorrhea. Right sinus exhibits no maxillary sinus tenderness and no frontal sinus tenderness.  Left sinus exhibits no maxillary sinus tenderness and no frontal sinus tenderness.  Mouth/Throat: Uvula is midline, oropharynx is clear and moist and mucous membranes are normal. No oropharyngeal exudate, posterior oropharyngeal edema, posterior oropharyngeal erythema or tonsillar abscesses.  Eyes: Conjunctivae are normal.  Cardiovascular: Regular rhythm, normal heart sounds and normal pulses.   Pulmonary/Chest: Effort normal and breath sounds normal. She has no wheezes. She has no rhonchi. She has no rales.  Lymphadenopathy:       Head (right side): No submental, no submandibular, no tonsillar, no preauricular, no posterior auricular and no occipital adenopathy present.       Head (left side): No submental, no submandibular, no tonsillar, no preauricular, no posterior auricular and no occipital adenopathy present.    She has no cervical adenopathy.  Neurological: She is alert.  Skin: Skin is warm and dry.  Psychiatric: She has a normal mood and affect. Her speech is normal and behavior is normal. Thought content normal.  Vitals reviewed.      Assessment & Plan:  Cough Suspect post viral cough. Patient recently had azithromycin, with cough and loss of voice improving. We discussed and agreed on supportive care at this time.  Due to duration of her cough and has history of cardiomyopathy, pending CXR.   - promethazine-dextromethorphan (PROMETHAZINE-DM) 6.25-15 MG/5ML syrup; Take 5 mLs by mouth at bedtime as needed for cough.  Dispense: 40 mL; Refill: 1 - DG Chest 2 View; Future     I am having Ms. Pinch start on promethazine-dextromethorphan. I am also having her maintain her aspirin, multivitamin, Calcium Carbonate-Vitamin D, pravastatin, furosemide, lisinopril, and carvedilol.   Meds ordered this encounter  Medications  . promethazine-dextromethorphan (PROMETHAZINE-DM) 6.25-15 MG/5ML syrup    Sig: Take 5 mLs by mouth at bedtime as needed for cough.    Dispense:  40 mL    Refill:  1     Order Specific Question:  Supervising Provider    Answer:  Cassandria Anger [[id number]]     Start medications as prescribed and explained to patient on After Visit Summary ( AVS). Risks, benefits, and alternatives of the medications and treatment plan prescribed today were discussed, and patient expressed understanding.   Education regarding symptom management and diagnosis given to patient.   Follow-up:Plan follow-up and return precautions given if any worsening symptoms or change in condition.   Continue to follow with Cathlean Cower, MD for routine health maintenance.   Carol Naegeli Whittenberg and I agreed with plan.   Mable Paris, FNP

## 2016-05-26 ENCOUNTER — Telehealth: Payer: Self-pay

## 2016-05-26 DIAGNOSIS — R05 Cough: Secondary | ICD-10-CM

## 2016-05-26 DIAGNOSIS — R059 Cough, unspecified: Secondary | ICD-10-CM

## 2016-05-26 MED ORDER — DOXYCYCLINE HYCLATE 100 MG PO TABS: 100.0000 mg | ORAL_TABLET | Freq: Two times a day (BID) | ORAL | Status: DC

## 2016-05-26 NOTE — Telephone Encounter (Signed)
Patient called and said she is no better. Her throat is hurting worse. She was here on June 5 and the week before. She would really like some medication. Can you please follow up. Thank you.

## 2016-05-26 NOTE — Telephone Encounter (Signed)
Patient called back. I informed her of the notes and the abx was ready to be picked up. Thank you.

## 2016-05-26 NOTE — Telephone Encounter (Signed)
I'm sending antibiotics per patient. I'm sorry that she's not any better. If she is not improved on this antibiotic she needs to make an appointment with [place].  Please ensure no shortness of breath, wheezing. She has a history of COPD.

## 2016-06-22 ENCOUNTER — Other Ambulatory Visit: Payer: Self-pay

## 2016-06-22 MED ORDER — PRAVASTATIN SODIUM 80 MG PO TABS: ORAL_TABLET | ORAL | Status: DC

## 2016-07-05 ENCOUNTER — Other Ambulatory Visit: Payer: Self-pay | Admitting: Internal Medicine

## 2016-07-05 DIAGNOSIS — Z1231 Encounter for screening mammogram for malignant neoplasm of breast: Secondary | ICD-10-CM

## 2016-07-27 ENCOUNTER — Other Ambulatory Visit: Payer: Self-pay | Admitting: Cardiology

## 2016-08-12 ENCOUNTER — Ambulatory Visit
Admission: RE | Admit: 2016-08-12 | Discharge: 2016-08-12 | Disposition: A | Discharge status: Home / Self Care | Payer: Medicare Other | Source: Ambulatory Visit | Attending: Internal Medicine | Admitting: Internal Medicine

## 2016-08-12 DIAGNOSIS — Z1231 Encounter for screening mammogram for malignant neoplasm of breast: Secondary | ICD-10-CM | POA: Diagnosis not present

## 2016-08-23 ENCOUNTER — Telehealth: Payer: Self-pay | Admitting: Internal Medicine

## 2016-08-23 DIAGNOSIS — Z0000 Encounter for general adult medical examination without abnormal findings: Secondary | ICD-10-CM

## 2016-08-23 NOTE — Telephone Encounter (Signed)
Orders placed.

## 2016-08-23 NOTE — Telephone Encounter (Signed)
Patient requesting labs to be entered before CPE in December.

## 2016-08-29 DIAGNOSIS — N958 Other specified menopausal and perimenopausal disorders: Secondary | ICD-10-CM | POA: Diagnosis not present

## 2016-08-29 DIAGNOSIS — M8588 Other specified disorders of bone density and structure, other site: Secondary | ICD-10-CM | POA: Diagnosis not present

## 2016-08-29 DIAGNOSIS — Z13820 Encounter for screening for osteoporosis: Secondary | ICD-10-CM | POA: Diagnosis not present

## 2016-08-29 DIAGNOSIS — Z779 Other contact with and (suspected) exposures hazardous to health: Secondary | ICD-10-CM | POA: Diagnosis not present

## 2016-08-29 DIAGNOSIS — Z01419 Encounter for gynecological examination (general) (routine) without abnormal findings: Secondary | ICD-10-CM | POA: Diagnosis not present

## 2016-08-29 DIAGNOSIS — Z6824 Body mass index (BMI) 24.0-24.9, adult: Secondary | ICD-10-CM | POA: Diagnosis not present

## 2016-09-13 ENCOUNTER — Ambulatory Visit (INDEPENDENT_AMBULATORY_CARE_PROVIDER_SITE_OTHER): Payer: Medicare Other

## 2016-09-13 DIAGNOSIS — Z23 Encounter for immunization: Secondary | ICD-10-CM

## 2016-10-24 ENCOUNTER — Other Ambulatory Visit: Payer: Self-pay | Admitting: Cardiology

## 2016-11-14 ENCOUNTER — Other Ambulatory Visit (INDEPENDENT_AMBULATORY_CARE_PROVIDER_SITE_OTHER): Payer: Medicare Other

## 2016-11-14 DIAGNOSIS — Z0000 Encounter for general adult medical examination without abnormal findings: Secondary | ICD-10-CM

## 2016-11-14 LAB — URINALYSIS, ROUTINE W REFLEX MICROSCOPIC
BILIRUBIN URINE: NEGATIVE
Ketones, ur: NEGATIVE
LEUKOCYTES UA: NEGATIVE
Nitrite: NEGATIVE
SPECIFIC GRAVITY, URINE: 1.025 (ref 1.000–1.030)
URINE GLUCOSE: NEGATIVE
UROBILINOGEN UA: 0.2 (ref 0.0–1.0)
pH: 6 (ref 5.0–8.0)

## 2016-11-14 LAB — BASIC METABOLIC PANEL
BUN: 19 mg/dL (ref 6–23)
CHLORIDE: 107 meq/L (ref 96–112)
CO2: 28 meq/L (ref 19–32)
CREATININE: 1.02 mg/dL (ref 0.40–1.20)
Calcium: 9.5 mg/dL (ref 8.4–10.5)
GFR: 55.69 mL/min — ABNORMAL LOW (ref >60.00)
GLUCOSE: 97 mg/dL (ref 70–99)
POTASSIUM: 3.8 meq/L (ref 3.5–5.1)
Sodium: 142 mEq/L (ref 135–145)

## 2016-11-14 LAB — CBC WITH DIFFERENTIAL/PLATELET
BASOS ABS: 0 10*3/uL (ref 0.0–0.1)
BASOS PCT: 0.3 % (ref 0.0–3.0)
EOS ABS: 0.1 10*3/uL (ref 0.0–0.7)
Eosinophils Relative: 2 % (ref 0.0–5.0)
HEMATOCRIT: 46.3 % — AB (ref 36.0–46.0)
HEMOGLOBIN: 15.7 g/dL — AB (ref 12.0–15.0)
LYMPHS PCT: 25.2 % (ref 12.0–46.0)
Lymphs Abs: 1.3 10*3/uL (ref 0.7–4.0)
MCHC: 33.8 g/dL (ref 30.0–36.0)
MCV: 88.5 fl (ref 78.0–100.0)
MONOS PCT: 9.5 % (ref 3.0–12.0)
Monocytes Absolute: 0.5 10*3/uL (ref 0.1–1.0)
NEUTROS ABS: 3.4 10*3/uL (ref 1.4–7.7)
Neutrophils Relative %: 63 % (ref 43.0–77.0)
Platelets: 138 10*3/uL — ABNORMAL LOW (ref 150.0–400.0)
RBC: 5.24 Mil/uL — ABNORMAL HIGH (ref 3.87–5.11)
RDW: 14.2 % (ref 11.5–15.5)
WBC: 5.3 10*3/uL (ref 4.0–10.5)

## 2016-11-14 LAB — HEPATIC FUNCTION PANEL
ALBUMIN: 4.4 g/dL (ref 3.5–5.2)
ALK PHOS: 61 U/L (ref 39–117)
ALT: 17 U/L (ref 0–35)
AST: 15 U/L (ref 0–37)
BILIRUBIN DIRECT: 0.2 mg/dL (ref 0.0–0.3)
TOTAL PROTEIN: 6.9 g/dL (ref 6.0–8.3)
Total Bilirubin: 0.7 mg/dL (ref 0.2–1.2)

## 2016-11-14 LAB — LIPID PANEL
CHOL/HDL RATIO: 4
CHOLESTEROL: 133 mg/dL (ref 0–200)
HDL: 33 mg/dL — ABNORMAL LOW (ref >39.00)
LDL CALC: 67 mg/dL (ref 0–99)
NonHDL: 100.09
TRIGLYCERIDES: 166 mg/dL — AB (ref 0.0–149.0)
VLDL: 33.2 mg/dL (ref 0.0–40.0)

## 2016-11-14 LAB — TSH: TSH: 2.48 u[IU]/mL (ref 0.35–4.50)

## 2016-11-22 ENCOUNTER — Ambulatory Visit (INDEPENDENT_AMBULATORY_CARE_PROVIDER_SITE_OTHER): Payer: Medicare Other | Admitting: Internal Medicine

## 2016-11-22 ENCOUNTER — Encounter: Payer: Self-pay | Admitting: Internal Medicine

## 2016-11-22 VITALS — BP 138/78 | HR 67 | Temp 98.4°F | Resp 20 | Wt 143.0 lb

## 2016-11-22 DIAGNOSIS — Z86010 Personal history of colonic polyps: Secondary | ICD-10-CM

## 2016-11-22 DIAGNOSIS — Z0000 Encounter for general adult medical examination without abnormal findings: Secondary | ICD-10-CM | POA: Diagnosis not present

## 2016-11-22 DIAGNOSIS — R7302 Impaired glucose tolerance (oral): Secondary | ICD-10-CM | POA: Diagnosis not present

## 2016-11-22 DIAGNOSIS — M8580 Other specified disorders of bone density and structure, unspecified site: Secondary | ICD-10-CM | POA: Diagnosis not present

## 2016-11-22 NOTE — Patient Instructions (Signed)
Please continue all other medications as before, and refills have been done if requested.  Please have the pharmacy call with any other refills you may need.  Please continue your efforts at being more active, low cholesterol diet, and weight control.  You are otherwise up to date with prevention measures today.  Please keep your appointments with your specialists as you may have planned  You will be contacted regarding the referral for: colonoscopy  Your lab work was OK  Please remember to sign up for MyChart if you have not done so, as this will be important to you in the future with finding out test results, communicating by private email, and scheduling acute appointments online when needed.  If you have Medicare related insurance (such as traditoinal Medicare, [employer] or [employer], or similar), Please make an appointment at the Scheduling desk with Maudie Mercury, the [employer], for your Wellness Visit in this office, which is a benefit with your insurance.  Please return in 1 year for your yearly visit, or sooner if needed, with Lab testing done 3-5 days before

## 2016-11-22 NOTE — Progress Notes (Signed)
Subjective:    Patient ID: Carol Lawrence, female    DOB: December 21, 1935, 80 y.o.   MRN: [id number]  HPI  Here for wellness and f/u;  Overall doing ok;  Pt denies Chest pain, worsening SOB, DOE, wheezing, orthopnea, PND, worsening LE edema, palpitations, dizziness or syncope.  Pt denies neurological change such as new headache, facial or extremity weakness.  Pt denies polydipsia, polyuria, or low sugar symptoms. Pt states overall good compliance with treatment and medications, good tolerability, and has been trying to follow appropriate diet.  Pt denies worsening depressive symptoms, suicidal ideation or panic. No fever, night sweats, wt loss, loss of appetite, or other constitutional symptoms.  Pt states good ability with ADL's, has low fall risk, home safety reviewed and adequate, no other significant changes in hearing or vision, and only occasionally active with exercise.  No other hx changes. Just had DXA per GYN in June 2017 Not taking asa, MVI or calcium supp.but willing to restart.   Past Medical History:  Diagnosis Date  . Allergic rhinitis   . Bell's palsy   . Cardiomyopathy, dilated ([place]) 08/26/2011   15% 2011, 55% 2012  . Carotid stenosis 09/12/2012   Bilateral, mild [facility] screening  . Colonic polyp   . DVT (deep venous thrombosis) ([place])   . Glucose intolerance (impaired glucose tolerance)   . HLD (hyperlipidemia)   . HTN (hypertension)   . NICM (nonischemic cardiomyopathy) ([place])    Coronaries; left main essentially was nonexistent or very short with  . PVD (peripheral vascular disease) ([place])    Past Surgical History:  Procedure Laterality Date  . hysterectomy - unknown type    . LUMBAR FUSION  1983  . OOPHORECTOMY      reports that she quit smoking about 35 years ago. She does not have any smokeless tobacco history on file. She reports that she does not drink alcohol. Her drug history is not on file. family history includes COPD in her mother; Osteoporosis in her  mother. Allergies  Allergen Reactions  . Amoxicillin     Curam---from [facility]  . Codeine    Current Outpatient Prescriptions on File Prior to Visit  Medication Sig Dispense Refill  . aspirin 81 MG tablet Take 81 mg by mouth daily.      . Calcium Carbonate-Vitamin D (CALCIUM 600+D) 600-400 MG-UNIT per tablet Take 1 tablet by mouth daily.      . carvedilol (COREG) 25 MG tablet Take 1 tablet (25 mg total) by mouth 2 (two) times daily. 60 tablet 11  . furosemide (LASIX) 20 MG tablet Take 1 tablet (20 mg total) by mouth daily. 90 tablet 2  . lisinopril (PRINIVIL,ZESTRIL) 20 MG tablet TAKE 1 TABLET BY MOUTH EVERY DAY 90 tablet 0  . Multiple Vitamin (MULTIVITAMIN) tablet Take 1 tablet by mouth daily.      . pravastatin (PRAVACHOL) 80 MG tablet TAKE 1 TABLET BY MOUTH AT BEDTIME 90 tablet 1   No current facility-administered medications on file prior to visit.    Review of Systems Constitutional: Negative for increased diaphoresis, or other activity, appetite or siginficant weight change other than noted HENT: Negative for worsening hearing loss, ear pain, facial swelling, mouth sores and neck stiffness.   Eyes: Negative for other worsening pain, redness or visual disturbance.  Respiratory: Negative for choking or stridor Cardiovascular: Negative for other chest pain and palpitations.  Gastrointestinal: Negative for worsening diarrhea, blood in stool, or abdominal distention Genitourinary: Negative for hematuria, flank pain or change  in urine volume.  Musculoskeletal: Negative for myalgias or other joint complaints.  Skin: Negative for other color change and wound or drainage.  Neurological: Negative for syncope and numbness. other than noted Hematological: Negative for adenopathy. or other swelling Psychiatric/Behavioral: Negative for hallucinations, SI, self-injury, decreased concentration or other worsening agitation.  All other system neg per pt    Objective:   Physical Exam BP 138/78    Pulse 67   Temp 98.4 F (36.9 C) (Oral)   Resp 20   Wt 143 lb (64.9 kg)   SpO2 96%   BMI 23.80 kg/m  VS noted,  Constitutional: Pt is oriented to person, place, and time. Appears well-developed and well-nourished, in no significant distress Head: Normocephalic and atraumatic  Eyes: Conjunctivae and EOM are normal. Pupils are equal, round, and reactive to light Right Ear: External ear normal.  Left Ear: External ear normal Nose: Nose normal.  Mouth/Throat: Oropharynx is clear and moist  Neck: Normal range of motion. Neck supple. No JVD present. No tracheal deviation present or significant neck LA or mass Cardiovascular: Normal rate, regular rhythm, normal heart sounds and intact distal pulses.   Pulmonary/Chest: Effort normal and breath sounds without rales or wheezing  Abdominal: Soft. Bowel sounds are normal. NT. No HSM  Musculoskeletal: Normal range of motion. Exhibits no edema Lymphadenopathy: Has no cervical adenopathy.  Neurological: Pt is alert and oriented to person, place, and time. Pt has normal reflexes. No cranial nerve deficit. Motor grossly intact Skin: Skin is warm and dry. No rash noted or new ulcers Psychiatric:  Has normal mood and affect. Behavior is normal.  No other new exam findings  ECG I have personally interpreted Sinus  Rhythm  -Anterior infarct -age undetermined.     Assessment & Plan:

## 2016-11-22 NOTE — Progress Notes (Signed)
Pre visit review using our clinic review tool, if applicable. No additional management support is needed unless otherwise documented below in the visit note. 

## 2016-11-27 NOTE — Assessment & Plan Note (Signed)

## 2016-11-27 NOTE — Assessment & Plan Note (Signed)
For colonoscopy as is due 

## 2016-11-27 NOTE — Assessment & Plan Note (Signed)
stable overall by history and exam, recent data reviewed with pt, and pt to continue medical treatment as before,  to f/u any worsening symptoms or concerns Lab Results  Component Value Date   HGBA1C 5.8 09/03/2012    

## 2016-11-27 NOTE — Assessment & Plan Note (Signed)
For f/u dxa records per GYN

## 2016-11-28 ENCOUNTER — Other Ambulatory Visit: Payer: Self-pay | Admitting: Cardiology

## 2016-11-28 NOTE — Telephone Encounter (Signed)
REFILL 

## 2016-11-30 ENCOUNTER — Encounter: Payer: Self-pay | Admitting: Gastroenterology

## 2016-12-15 ENCOUNTER — Encounter: Payer: Self-pay | Admitting: Internal Medicine

## 2016-12-15 ENCOUNTER — Ambulatory Visit (INDEPENDENT_AMBULATORY_CARE_PROVIDER_SITE_OTHER): Payer: Medicare Other | Admitting: Internal Medicine

## 2016-12-15 VITALS — BP 144/72 | HR 80 | Temp 98.3°F | Wt 144.0 lb

## 2016-12-15 DIAGNOSIS — R05 Cough: Secondary | ICD-10-CM

## 2016-12-15 DIAGNOSIS — I10 Essential (primary) hypertension: Secondary | ICD-10-CM

## 2016-12-15 DIAGNOSIS — R059 Cough, unspecified: Secondary | ICD-10-CM

## 2016-12-15 MED ORDER — AZITHROMYCIN 250 MG PO TABS: ORAL_TABLET | ORAL | 1 refills | Status: DC

## 2016-12-15 NOTE — Progress Notes (Signed)
Pre visit review using our clinic review tool, if applicable. No additional management support is needed unless otherwise documented below in the visit note. 

## 2016-12-15 NOTE — Progress Notes (Signed)
Subjective:    Patient ID: Carol Lawrence, female    DOB: 10/04/1936, 80 y.o.   MRN: [id number]  HPI  Here with acute onset mild to mod 2-3 days ST, HA, general weakness and malaise, with prod cough greenish sputum, but Pt denies chest pain, increased sob or doe, wheezing, orthopnea, PND, increased LE swelling, palpitations, dizziness or syncope. Pt denies new neurological symptoms such as new headache, or facial or extremity weakness or numbness   Pt denies polydipsia, polyuria, No other new history Past Medical History:  Diagnosis Date  . Allergic rhinitis   . Bell's palsy   . Cardiomyopathy, dilated ([place]) 08/26/2011   15% 2011, 55% 2012  . Carotid stenosis 09/12/2012   Bilateral, mild Lifeline screening  . Colonic polyp   . DVT (deep venous thrombosis) ([place])   . Glucose intolerance (impaired glucose tolerance)   . HLD (hyperlipidemia)   . HTN (hypertension)   . NICM (nonischemic cardiomyopathy) ([place])    Coronaries; left main essentially was nonexistent or very short with  . PVD (peripheral vascular disease) ([place])    Past Surgical History:  Procedure Laterality Date  . hysterectomy - unknown type    . LUMBAR FUSION  1983  . OOPHORECTOMY      reports that she quit smoking about 36 years ago. She does not have any smokeless tobacco history on file. She reports that she does not drink alcohol. Her drug history is not on file. family history includes COPD in her mother; Osteoporosis in her mother. Allergies  Allergen Reactions  . Amoxicillin     Curam---from [facility]  . Codeine    Current Outpatient Prescriptions on File Prior to Visit  Medication Sig Dispense Refill  . aspirin 81 MG tablet Take 81 mg by mouth daily.      . Calcium Carbonate-Vitamin D (CALCIUM 600+D) 600-400 MG-UNIT per tablet Take 1 tablet by mouth daily.      . carvedilol (COREG) 25 MG tablet Take 1 tablet (25 mg total) by mouth 2 (two) times daily. 60 tablet 11  . furosemide (LASIX) 20 MG tablet Take 1  tablet (20 mg total) by mouth daily. 90 tablet 2  . lisinopril (PRINIVIL,ZESTRIL) 20 MG tablet TAKE 1 TABLET BY MOUTH EVERY DAY 90 tablet 0  . Multiple Vitamin (MULTIVITAMIN) tablet Take 1 tablet by mouth daily.      . pravastatin (PRAVACHOL) 80 MG tablet TAKE 1 TABLET BY MOUTH AT BEDTIME 90 tablet 0   No current facility-administered medications on file prior to visit.      Review of Systems All otherwise neg per pt     Objective:   Physical Exam BP (!) 144/72   Pulse 80   Temp 98.3 F (36.8 C)   Wt 144 lb (65.3 kg)   SpO2 98%   BMI 23.96 kg/m  VS noted, mild ill Constitutional: Pt appears in no apparent distress HENT: Head: NCAT.  Right Ear: External ear normal.  Left Ear: External ear normal.  Eyes: . Pupils are equal, round, and reactive to light. Conjunctivae and EOM are normal Bilat tm's with mild erythema.  Max sinus areas mild tender.  Pharynx with mild erythema, no exudate Neck: Normal range of motion. Neck supple.  Cardiovascular: Normal rate and regular rhythm.   Pulmonary/Chest: Effort normal and breath sounds decreasd without rales or wheezing.  Neurological: Pt is alert. Not confused , motor grossly intact Skin: Skin is warm. No rash, no LE edema Psychiatric: Pt behavior is normal.  No agitation.  No other new exam findings    Assessment & Plan:

## 2016-12-15 NOTE — Patient Instructions (Signed)
Please take all new medication as prescribed - the antibiotic  You can also take Delsym OTC for cough, and/or Mucinex (or it's generic off brand) for congestion, and tylenol as needed for pain.  Please continue all other medications as before, and refills have been done if requested.  Please have the pharmacy call with any other refills you may need.  Please keep your appointments with your specialists as you may have planned    

## 2016-12-15 NOTE — Assessment & Plan Note (Signed)
Mild to mod, c/w bronchitis vs pna, declines cxr,  for antibx course,  otc mucinex and delsym prn, to f/u any worsening symptoms or concerns

## 2016-12-15 NOTE — Assessment & Plan Note (Addendum)
,  mild elevated today likely situational, o/w stable overall by history and exam, recent data reviewed with pt, and pt to continue medical treatment as before,  to f/u any worsening symptoms or concerns BP Readings from Last 3 Encounters:  12/15/16 (!) 144/72  11/22/16 138/78  05/23/16 140/60

## 2017-01-17 ENCOUNTER — Ambulatory Visit: Payer: Medicare Other | Admitting: Gastroenterology

## 2017-01-20 ENCOUNTER — Other Ambulatory Visit: Payer: Self-pay | Admitting: Cardiology

## 2017-02-10 ENCOUNTER — Ambulatory Visit: Payer: Medicare Other

## 2017-02-21 ENCOUNTER — Ambulatory Visit (INDEPENDENT_AMBULATORY_CARE_PROVIDER_SITE_OTHER): Payer: Medicare Other | Admitting: Gastroenterology

## 2017-02-21 ENCOUNTER — Encounter: Payer: Self-pay | Admitting: Gastroenterology

## 2017-02-21 ENCOUNTER — Other Ambulatory Visit: Payer: Self-pay | Admitting: Cardiology

## 2017-02-21 VITALS — BP 132/70 | HR 78 | Ht 65.0 in | Wt 142.0 lb

## 2017-02-21 DIAGNOSIS — Z86010 Personal history of colonic polyps: Secondary | ICD-10-CM | POA: Diagnosis not present

## 2017-02-21 MED ORDER — NA SULFATE-K SULFATE-MG SULF 17.5-3.13-1.6 GM/177ML PO SOLN: 1.0000 | Freq: Once | ORAL | 0 refills | Status: AC

## 2017-02-21 NOTE — Patient Instructions (Signed)
You will be set up for a colonoscopy for polyp surveillance 

## 2017-02-21 NOTE — Progress Notes (Signed)
HPI: This is a  very pleasant 81 year old woman  who was previously a patient of Dr. Verl Blalock  Chief complaint is personal history of adenomatous polyps  Old Data Reviewed: She has had numerous colonoscopies with Dr. Verl Blalock. 1994, two adenomas were removed; one was 58mm pedunculated. 2003 he removed a 10 mm adenoma. 2006 follow-up colonoscopy showed no polyps. 2012 colonoscopy showed no polyps.\   Her recall charting came to my office a month or 2 ago and given her age, between 89 and 56, I recommended that she come to the office before we commit her to invasive testing.  2009 carditis, Echo 2016 CHF 40-45%.  Was as low as 15% when she was very ill.  No GI symptoms.  GM died of stomach cancer.    Review of systems: Pertinent positive and negative review of systems were noted in the above HPI section. Complete review of systems was performed and was otherwise normal.   Past Medical History:  Diagnosis Date  . Allergic rhinitis   . Bell's palsy   . Cardiomyopathy, dilated ([place]) 08/26/2011   15% 2011, 55% 2012  . Carotid stenosis 09/12/2012   Bilateral, mild Lifeline screening  . Colonic polyp   . DVT (deep venous thrombosis) ([place])   . Glucose intolerance (impaired glucose tolerance)   . HLD (hyperlipidemia)   . HTN (hypertension)   . NICM (nonischemic cardiomyopathy) ([place])    Coronaries; left main essentially was nonexistent or very short with  . PVD (peripheral vascular disease) ([place])     Past Surgical History:  Procedure Laterality Date  . hysterectomy - unknown type    . LUMBAR FUSION  1983  . OOPHORECTOMY      Current Outpatient Prescriptions  Medication Sig Dispense Refill  . aspirin 81 MG tablet Take 81 mg by mouth daily.      Marland Kitchen azithromycin (ZITHROMAX Z-PAK) 250 MG tablet 2 tab by mouth day 1, then 1 per day 6 tablet 1  . Calcium Carbonate-Vitamin D (CALCIUM 600+D) 600-400 MG-UNIT per tablet Take 1 tablet by mouth daily.      . carvedilol  (COREG) 25 MG tablet Take 1 tablet (25 mg total) by mouth 2 (two) times daily. 60 tablet 11  . furosemide (LASIX) 20 MG tablet Take 1 tablet (20 mg total) by mouth daily. 90 tablet 2  . lisinopril (PRINIVIL,ZESTRIL) 20 MG tablet TAKE 1 TABLET BY MOUTH EVERY DAY 90 tablet 0  . Multiple Vitamin (MULTIVITAMIN) tablet Take 1 tablet by mouth daily.      . pravastatin (PRAVACHOL) 80 MG tablet TAKE 1 TABLET BY MOUTH AT BEDTIME 90 tablet 0   No current facility-administered medications for this visit.     Allergies as of 02/21/2017 - Review Complete 02/21/2017  Allergen Reaction Noted  . Amoxicillin  05/23/2011  . Codeine  07/12/2007    Family History  Problem Relation Age of Onset  . COPD Mother   . Osteoporosis Mother   . Leukemia    . Coronary artery disease    . Hyperlipidemia    . Lung cancer    . Osteoporosis    . COPD    . Melanoma    . Liver cancer      Social History   Social History  . Marital status: Married    Spouse name: N/A  . Number of children: 2  . Years of education: N/A   Occupational History  . retired     [employer]  Social History Main Topics  . Smoking status: Former Smoker    Quit date: 12/19/1980  . Smokeless tobacco: Never Used  . Alcohol use No  . Drug use: Unknown  . Sexual activity: Not on file   Other Topics Concern  . Not on file   Social History Narrative  . No narrative on file     Physical Exam: BP 132/70   Pulse 78   Ht 5\' 5"  (1.651 m)   Wt 142 lb (64.4 kg)   BMI 23.63 kg/m  Constitutional: generally well-appearing Psychiatric: alert and oriented x3 Eyes: extraocular movements intact Mouth: oral pharynx moist, no lesions Neck: supple no lymphadenopathy Cardiovascular: heart regular rate and rhythm Lungs: clear to auscultation bilaterally Abdomen: soft, nontender, nondistended, no obvious ascites, no peritoneal signs, normal bowel sounds Extremities: no lower extremity edema bilaterally Skin: no lesions on  visible extremities   Assessment and plan: 81 y.o. female with  Personal history of adenomatous colon polyps  She has had high risk adenoma was removed from her colon in 18 and in 2003. Since then colonoscopies have found no polyps. She is very vibrant, healthy except for an ejection fraction of 40-45% based on 2016 echocardiogram. I do think colon cancer screening, polyp surveillance is still a reasonable question for her and we are going to go ahead with colonoscopy at her soonest convenience. She does understand that there are risks to the procedure such as perforation, missing a cancer, bleeding, cardiopulmonary events due to sedation.  Please see the "Patient Instructions" section for addition details about the plan.  Greater than 50% of this visit was spent in direct face-to-face counseling.  Total time of this visit was 1min.    Owens Loffler, MD [facility] 02/21/2017, 10:06 AM  Cc: Biagio Borg, MD

## 2017-04-03 ENCOUNTER — Encounter: Payer: Self-pay | Admitting: Gastroenterology

## 2017-04-17 ENCOUNTER — Encounter: Payer: Self-pay | Admitting: Gastroenterology

## 2017-04-17 ENCOUNTER — Ambulatory Visit (AMBULATORY_SURGERY_CENTER): Payer: Medicare Other | Admitting: Gastroenterology

## 2017-04-17 VITALS — BP 151/55 | HR 59 | Temp 96.8°F | Resp 14 | Ht 65.0 in | Wt 142.0 lb

## 2017-04-17 DIAGNOSIS — Z86010 Personal history of colonic polyps: Secondary | ICD-10-CM

## 2017-04-17 DIAGNOSIS — K5730 Diverticulosis of large intestine without perforation or abscess without bleeding: Secondary | ICD-10-CM

## 2017-04-17 DIAGNOSIS — K649 Unspecified hemorrhoids: Secondary | ICD-10-CM | POA: Diagnosis not present

## 2017-04-17 MED ORDER — SODIUM CHLORIDE 0.9 % IV SOLN: 500.0000 mL | INTRAVENOUS | Status: DC

## 2017-04-17 NOTE — Patient Instructions (Signed)
Impression/recommendations:  Diverticulosis (handout given) Hemorrhoids (handout given)  YOU HAD AN ENDOSCOPIC PROCEDURE TODAY AT THE [facility]:   Refer to the procedure report that was given to you for any specific questions about what was found during the examination.  If the procedure report does not answer your questions, please call your gastroenterologist to clarify.  If you requested that your care partner not be given the details of your procedure findings, then the procedure report has been included in a sealed envelope for you to review at your convenience later.  YOU SHOULD EXPECT: Some feelings of bloating in the abdomen. Passage of more gas than usual.  Walking can help get rid of the air that was put into your GI tract during the procedure and reduce the bloating. If you had a lower endoscopy (such as a colonoscopy or flexible sigmoidoscopy) you may notice spotting of blood in your stool or on the toilet paper. If you underwent a bowel prep for your procedure, you may not have a normal bowel movement for a few days.  Please Note:  You might notice some irritation and congestion in your nose or some drainage.  This is from the oxygen used during your procedure.  There is no need for concern and it should clear up in a day or so.  SYMPTOMS TO REPORT IMMEDIATELY:   Following lower endoscopy (colonoscopy or flexible sigmoidoscopy):  Excessive amounts of blood in the stool  Significant tenderness or worsening of abdominal pains  Swelling of the abdomen that is new, acute  Fever of 100F or higher  For urgent or emergent issues, a gastroenterologist can be reached at any hour by calling [phone].   DIET:  We do recommend a small meal at first, but then you may proceed to your regular diet.  Drink plenty of fluids but you should avoid alcoholic beverages for 24 hours.  ACTIVITY:  You should plan to take it easy for the rest of today and you should NOT DRIVE or use  heavy machinery until tomorrow (because of the sedation medicines used during the test).    FOLLOW UP: Our staff will call the number listed on your records the next business day following your procedure to check on you and address any questions or concerns that you may have regarding the information given to you following your procedure. If we do not reach you, we will leave a message.  However, if you are feeling well and you are not experiencing any problems, there is no need to return our call.  We will assume that you have returned to your regular daily activities without incident.  If any biopsies were taken you will be contacted by phone or by letter within the next 1-3 weeks.  Please call us at [phone] if you have not heard about the biopsies in 3 weeks.    SIGNATURES/CONFIDENTIALITY: You and/or your care partner have signed paperwork which will be entered into your electronic medical record.  These signatures attest to the fact that that the information above on your After Visit Summary has been reviewed and is understood.  Full responsibility of the confidentiality of this discharge information lies with you and/or your care-partner.   

## 2017-04-17 NOTE — Progress Notes (Signed)
Report given to PACU, vss 

## 2017-04-17 NOTE — Op Note (Signed)
[facility] Patient Name: Carol Lawrence Procedure Date: 04/17/2017 9:01 AM MRN: [id number] Endoscopist: Milus Banister , MD Age: 81 Referring MD:  Date of Birth: [birth year] Gender: Female Account #: [id number] Procedure:                Colonoscopy Indications:              High risk colon cancer surveillance: Personal                            history of colonic polyps: She has had numerous                            colonoscopies with Dr. Verl Blalock. 1994, two                            adenomas were removed; one was 65mm pedunculated.                            2003 he removed a 10 mm adenoma. 2006 follow-up                            colonoscopy showed no polyps. 2012 colonoscopy                            showed no polyps Medicines:                Monitored Anesthesia Care Procedure:                Pre-Anesthesia Assessment:                           - Prior to the procedure, a History and Physical                            was performed, and patient medications and                            allergies were reviewed. The patient's tolerance of                            previous anesthesia was also reviewed. The risks                            and benefits of the procedure and the sedation                            options and risks were discussed with the patient.                            All questions were answered, and informed consent                            was obtained. Prior Anticoagulants: The patient has  taken no previous anticoagulant or antiplatelet                            agents. ASA Grade Assessment: II - A patient with                            mild systemic disease. After reviewing the risks                            and benefits, the patient was deemed in                            satisfactory condition to undergo the procedure.                           After obtaining informed consent, the colonoscope                          was passed under direct vision. Throughout the                            procedure, the patient's blood pressure, pulse, and                            oxygen saturations were monitored continuously. The                            Colonoscope was introduced through the anus and                            advanced to the the cecum, identified by                            appendiceal orifice and ileocecal valve. The                            colonoscopy was performed without difficulty. The                            patient tolerated the procedure well. The quality                            of the bowel preparation was good. The ileocecal                            valve, appendiceal orifice, and rectum were                            photographed. Scope In: 9:03:17 AM Scope Out: 9:14:40 AM Scope Withdrawal Time: 0 hours 7 minutes 58 seconds  Total Procedure Duration: 0 hours 11 minutes 23 seconds  Findings:                 Multiple small and large-mouthed diverticula were  found in the left colon.                           Internal hemorrhoids were found. The hemorrhoids                            were small.                           The exam was otherwise without abnormality on                            direct and retroflexion views. Complications:            No immediate complications. Estimated blood loss:                            None. Estimated Blood Loss:     Estimated blood loss: none. Impression:               - Diverticulosis in the left colon.                           - Internal hemorrhoids.                           - The examination was otherwise normal on direct                            and retroflexion views.                           - No specimens collected. Recommendation:           - Patient has a contact number available for                            emergencies. The signs and symptoms of potential                             delayed complications were discussed with the                            patient. Return to normal activities tomorrow.                            Written discharge instructions were provided to the                            patient.                           - Resume previous diet.                           - Continue present medications.                           -  No repeat colonoscopy due to age.                           -                           You do not need any further colon cancer screening                            tests (including stool testing). These types of                            tests generally stop around age 80-80. Milus Banister, MD 04/17/2017 9:17:14 AM This report has been signed electronically.

## 2017-04-18 ENCOUNTER — Telehealth: Payer: Self-pay

## 2017-04-18 NOTE — Telephone Encounter (Signed)
  Follow up Call-  Call back number 04/17/2017  Post procedure Call Back phone  # [phone]  Permission to leave phone message Yes  Some recent data might be hidden     Patient questions:  Do you have a fever, pain , or abdominal swelling? No. Pain Score  0 *  Have you tolerated food without any problems? Yes.    Have you been able to return to your normal activities? Yes.    Do you have any questions about your discharge instructions: Diet   No. Medications  No. Follow up visit  No.  Do you have questions or concerns about your Care? No.  Actions: * If pain score is 4 or above: No action needed, pain <4.

## 2017-04-20 ENCOUNTER — Other Ambulatory Visit: Payer: Self-pay | Admitting: Cardiology

## 2017-04-24 ENCOUNTER — Other Ambulatory Visit: Payer: Self-pay | Admitting: Cardiology

## 2017-05-21 ENCOUNTER — Other Ambulatory Visit: Payer: Self-pay | Admitting: Cardiology

## 2017-05-22 DIAGNOSIS — L570 Actinic keratosis: Secondary | ICD-10-CM | POA: Diagnosis not present

## 2017-05-22 DIAGNOSIS — L821 Other seborrheic keratosis: Secondary | ICD-10-CM | POA: Diagnosis not present

## 2017-05-22 NOTE — Telephone Encounter (Signed)
REFILL 

## 2017-05-28 ENCOUNTER — Other Ambulatory Visit: Payer: Self-pay | Admitting: Cardiology

## 2017-06-17 ENCOUNTER — Other Ambulatory Visit: Payer: Self-pay | Admitting: Cardiology

## 2017-06-29 ENCOUNTER — Other Ambulatory Visit: Payer: Self-pay

## 2017-06-29 MED ORDER — CARVEDILOL 25 MG PO TABS: 25.0000 mg | ORAL_TABLET | Freq: Two times a day (BID) | ORAL | 0 refills | Status: DC

## 2017-06-30 ENCOUNTER — Other Ambulatory Visit: Payer: Self-pay | Admitting: Cardiology

## 2017-07-10 ENCOUNTER — Emergency Department (HOSPITAL_COMMUNITY)
Admission: EM | Admit: 2017-07-10 | Discharge: 2017-07-10 | Disposition: A | Discharge status: Home / Self Care | Payer: Medicare Other | Attending: Emergency Medicine | Admitting: Emergency Medicine

## 2017-07-10 ENCOUNTER — Telehealth: Payer: Self-pay

## 2017-07-10 ENCOUNTER — Telehealth: Payer: Self-pay | Admitting: Internal Medicine

## 2017-07-10 ENCOUNTER — Encounter (HOSPITAL_COMMUNITY): Payer: Self-pay | Admitting: Emergency Medicine

## 2017-07-10 DIAGNOSIS — K921 Melena: Secondary | ICD-10-CM | POA: Insufficient documentation

## 2017-07-10 DIAGNOSIS — I509 Heart failure, unspecified: Secondary | ICD-10-CM | POA: Insufficient documentation

## 2017-07-10 DIAGNOSIS — Z8542 Personal history of malignant neoplasm of other parts of uterus: Secondary | ICD-10-CM | POA: Diagnosis not present

## 2017-07-10 DIAGNOSIS — I110 Hypertensive heart disease with heart failure: Secondary | ICD-10-CM | POA: Insufficient documentation

## 2017-07-10 DIAGNOSIS — K922 Gastrointestinal hemorrhage, unspecified: Secondary | ICD-10-CM | POA: Diagnosis present

## 2017-07-10 DIAGNOSIS — Z7982 Long term (current) use of aspirin: Secondary | ICD-10-CM | POA: Insufficient documentation

## 2017-07-10 DIAGNOSIS — Z87891 Personal history of nicotine dependence: Secondary | ICD-10-CM | POA: Insufficient documentation

## 2017-07-10 LAB — COMPREHENSIVE METABOLIC PANEL
ALBUMIN: 4.1 g/dL (ref 3.5–5.0)
ALK PHOS: 60 U/L (ref 38–126)
ALT: 19 U/L (ref 14–54)
AST: 18 U/L (ref 15–41)
Anion gap: 6 (ref 5–15)
BILIRUBIN TOTAL: 1.1 mg/dL (ref 0.3–1.2)
BUN: 15 mg/dL (ref 6–20)
CO2: 26 mmol/L (ref 22–32)
Calcium: 9.1 mg/dL (ref 8.9–10.3)
Chloride: 109 mmol/L (ref 101–111)
Creatinine, Ser: 0.88 mg/dL (ref 0.44–1.00)
GFR calc Af Amer: >60 mL/min (ref >60)
GFR calc non Af Amer: >60 mL/min (ref >60)
GLUCOSE: 109 mg/dL — AB (ref 65–99)
POTASSIUM: 3.4 mmol/L — AB (ref 3.5–5.1)
Sodium: 141 mmol/L (ref 135–145)
TOTAL PROTEIN: 7 g/dL (ref 6.5–8.1)

## 2017-07-10 LAB — CBC
HEMATOCRIT: 47.3 % — AB (ref 36.0–46.0)
Hemoglobin: 16.3 g/dL — ABNORMAL HIGH (ref 12.0–15.0)
MCH: 29.9 pg (ref 26.0–34.0)
MCHC: 34.5 g/dL (ref 30.0–36.0)
MCV: 86.8 fL (ref 78.0–100.0)
Platelets: 121 10*3/uL — ABNORMAL LOW (ref 150–400)
RBC: 5.45 MIL/uL — ABNORMAL HIGH (ref 3.87–5.11)
RDW: 13.8 % (ref 11.5–15.5)
WBC: 5.6 10*3/uL (ref 4.0–10.5)

## 2017-07-10 LAB — TYPE AND SCREEN
ABO/RH(D): A POS
ANTIBODY SCREEN: NEGATIVE

## 2017-07-10 LAB — ABO/RH: ABO/RH(D): A POS

## 2017-07-10 NOTE — Telephone Encounter (Signed)
Patient called [facility] and stated that she has had bloody diarrhea for 2 days and has also passed mucus and blood without stool. Has had abdominal cramping as well. [facility] has already stated that pt has refused hospital. There is no opening within the office today. Per Marya Amsler pt needs to be seen at urgent care or ED. Please call pt to inform her.

## 2017-07-10 NOTE — Telephone Encounter (Signed)
Patient Name: Carol Lawrence DOB: 03-Feb-1936 Initial Comment Caller states, she is having bloody diarrhea with cramps, slightly Nurse Assessment Nurse: Vallery Sa, RN, Cathy Date/Time (Eastern Time): 07/10/2017 9:27:42 AM Confirm and document reason for call. If symptomatic, describe symptoms. ---Hoyle Sauer states she developed diarrhea with a large amount of blood 2 days ago (about 15 episodes in the past 24 hours). No fever. No injury in the past 3 days. She has been having abdominal cramping that has decreased today. Alert and responsive. Does the patient have any new or worsening symptoms? ---Yes Will a triage be completed? ---Yes Related visit to physician within the last 2 weeks? ---No Does the PT have any chronic conditions? (i.e. diabetes, asthma, etc.) ---Yes List chronic conditions. ---High Cholesterol, CHF in the past Is this a behavioral health or substance abuse call? ---No Guidelines Guideline Title Affirmed Question Affirmed Notes Rectal Bleeding [1] MODERATE rectal bleeding (small blood clots, passing blood without stool, or toilet water turns red) AND [2] more than once a day Final Disposition User Go to ED Now Vallery Sa, RN, [facility] declined the Go to ER disposition and asks if she can be seen at the office. Called the office backline and notified Lovena Le. Lovena Le states that someone from the office will call her back. Referrals GO TO FACILITY REFUSED Disagree/Comply: Disagree Disagree/Comply Reason: Disagree with instructions

## 2017-07-10 NOTE — ED Provider Notes (Signed)
[facility] DEPT Provider Note   CSN: [id number] Arrival date & time: 07/10/17  1057     History   Chief Complaint Chief Complaint  Patient presents with  . GI Bleeding    HPI Carol Lawrence is a 81 y.o. female.  The history is provided by the patient.  Abdominal Cramping  This is a new problem. The current episode started 2 days ago. Episode frequency: intermittent. The problem has been resolved. Pertinent negatives include no chest pain, no headaches and no shortness of breath. Nothing aggravates the symptoms. Nothing relieves the symptoms. She has tried nothing for the symptoms.   Associated with bright red hematochezia with diarrhea. No melena, N/V. No recent abx use, recent travel, no stream, river, or lake water consumption. No obvious suspicious food intake, other than [place] that morning.  Bleeding has been improving for the past 24 hrs.    Past Medical History:  Diagnosis Date  . Allergic rhinitis   . Bell's palsy   . Cancer ([place])    [phone]  . Cardiomyopathy, dilated ([place]) 08/26/2011   15% 2011, 55% 2012  . Carotid stenosis 09/12/2012   Bilateral, mild Lifeline screening  . CHF (congestive heart failure) ([place])   . Clotting disorder ([place])   . Colonic polyp   . DVT (deep venous thrombosis) ([place])   . Glucose intolerance (impaired glucose tolerance)   . HLD (hyperlipidemia)   . HTN (hypertension)   . NICM (nonischemic cardiomyopathy) ([place])    Coronaries; left main essentially was nonexistent or very short with  . PVD (peripheral vascular disease) [facility])     Patient Active Problem List   Diagnosis Date Noted  . Osteopenia 11/22/2016  . Polycythemia 01/06/2016  . Acute pharyngitis 11/18/2014  . Polycythemia, secondary 10/08/2014  . Cough 01/14/2014  . Toenail avulsion 09/17/2013  . Carotid stenosis 09/12/2012  . Impaired glucose tolerance 08/26/2011  . Preventative health care 08/26/2011  . CARDIOMYOPATHY 10/26/2010  . Palpitations  08/20/2010  . UNSPECIFIED HEART FAILURE 03/11/2010  . PERIPHERAL VASCULAR DISEASE 01/16/2009  . ELEVATED BLOOD PRESSURE WITHOUT DIAGNOSIS OF HYPERTENSION 01/16/2009  . LEIOMYOMA, UTERUS 07/12/2007  . HYPERLIPIDEMIA 07/12/2007  . Essential hypertension 07/12/2007  . DVT 07/12/2007  . ALLERGIC RHINITIS 07/12/2007  . BELL'S PALSY, HX OF 07/12/2007  . History of colonic polyps 07/12/2007    Past Surgical History:  Procedure Laterality Date  . COLONOSCOPY    . hysterectomy - unknown type    . LUMBAR FUSION  1983  . OOPHORECTOMY    . POLYPECTOMY    . TUBAL LIGATION      OB History    No data available       Home Medications    Prior to Admission medications   Medication Sig Start Date End Date Taking? Authorizing Provider  aspirin 81 MG tablet Take 81 mg by mouth daily.     Yes [provider]  Calcium Carbonate-Vitamin D (CALCIUM 600+D) 600-400 MG-UNIT per tablet Take 1 tablet by mouth daily.     Yes [provider]  carvedilol (COREG) 25 MG tablet Take 1 tablet (25 mg total) by mouth 2 (two) times daily. 06/29/17  Yes Minus Breeding, MD  lisinopril (PRINIVIL,ZESTRIL) 20 MG tablet TAKE 1 TABLET BY MOUTH EVERY DAY 04/20/17  Yes Minus Breeding, MD  Multiple Vitamin (MULTIVITAMIN) tablet Take 1 tablet by mouth daily.     Yes [provider]  pravastatin (PRAVACHOL) 80 MG tablet Take 1 tablet (80 mg total) by mouth at bedtime.  NEED OV. 05/22/17  Yes Minus Breeding, MD  furosemide (LASIX) 20 MG tablet Take 1 tablet (20 mg total) by mouth daily. Patient not taking: Reported on 07/10/2017 01/01/16   Biagio Borg, MD    Family History Family History  Problem Relation Age of Onset  . COPD Mother   . Osteoporosis Mother   . Leukemia Sister   . Colon polyps Sister   . Colon polyps Brother   . Leukemia Sister   . Leukemia Unknown   . Coronary artery disease Unknown   . Hyperlipidemia Unknown   . Lung cancer Unknown   . Osteoporosis Unknown   . COPD  Unknown   . Melanoma Unknown   . Liver cancer Unknown     Social History Social History  Substance Use Topics  . Smoking status: Former Smoker    Quit date: 12/19/1980  . Smokeless tobacco: Never Used  . Alcohol use No     Allergies   Amoxicillin and Codeine   Review of Systems Review of Systems  Respiratory: Negative for shortness of breath.   Cardiovascular: Negative for chest pain.  Genitourinary: Negative for dysuria.  Neurological: Negative for headaches.   All other systems are reviewed and are negative for acute change except as noted in the HPI   Physical Exam Updated Vital Signs BP (!) 176/70   Pulse 68   Temp 98 F (36.7 C) (Oral)   Resp 17   Ht 5\' 4"  (1.626 m)   Wt 62.6 kg (138 lb)   SpO2 95%   BMI 23.69 kg/m   Physical Exam  Constitutional: She is oriented to person, place, and time. She appears well-developed and well-nourished. No distress.  HENT:  Head: Normocephalic and atraumatic.  Nose: Nose normal.  Eyes: Pupils are equal, round, and reactive to light. Conjunctivae and EOM are normal. Right eye exhibits no discharge. Left eye exhibits no discharge. No scleral icterus.  Neck: Normal range of motion. Neck supple.  Cardiovascular: Normal rate and regular rhythm.  Exam reveals no gallop and no friction rub.   No murmur heard. Pulmonary/Chest: Effort normal and breath sounds normal. No stridor. No respiratory distress. She has no rales.  Abdominal: Soft. She exhibits no distension. There is no tenderness. There is no rigidity, no rebound, no guarding and no CVA tenderness.  Musculoskeletal: She exhibits no edema or tenderness.  Neurological: She is alert and oriented to person, place, and time.  Skin: Skin is warm and dry. No rash noted. She is not diaphoretic. No erythema.  Psychiatric: She has a normal mood and affect.  Vitals reviewed.    ED Treatments / Results  Labs (all labs ordered are listed, but only abnormal results are  displayed) Labs Reviewed  COMPREHENSIVE METABOLIC PANEL - Abnormal; Notable for the following:       Result Value   Potassium 3.4 (*)    Glucose, Bld 109 (*)    All other components within normal limits  CBC - Abnormal; Notable for the following:    RBC 5.45 (*)    Hemoglobin 16.3 (*)    HCT 47.3 (*)    Platelets 121 (*)    All other components within normal limits  POC OCCULT BLOOD, ED  TYPE AND SCREEN  ABO/RH    EKG  EKG Interpretation None       Radiology No results found.  Procedures Procedures (including critical care time)  Medications Ordered in ED Medications - No data to display   Initial Impression /  Assessment and Plan / ED Course  I have reviewed the triage vital signs and the nursing notes.  Pertinent labs & imaging results that were available during my care of the patient were reviewed by me and considered in my medical decision making (see chart for details).     On record review, recent colonscopy in April showed internal hemorrhoids and diverticulosis.  abd benign. Low suspicion for diverticulitis, small bowel obstruction, other serious intra-abdominal inflammatory/infectious process.  Likely irritation of diverticulosis or internal hemorrhoids. Patient declined DRE or anoscopy. Hemoglobin stable. I feel that the patient is stable for discharge with strict return precautions and close GI follow-up if symptoms persist.    Final Clinical Impressions(s) / ED Diagnoses   Final diagnoses:  Hematochezia   Disposition: Discharge  Condition: Good  I have discussed the results, Dx and Tx plan with the patient who expressed understanding and agree(s) with the plan. Discharge instructions discussed at great length. The patient was given strict return precautions who verbalized understanding of the instructions. No further questions at time of discharge.    New Prescriptions   No medications on file    Follow Up: Biagio Borg, MD [place] [address] [place]  Schedule an appointment as soon as possible for a visit  If symptoms do not improve or  worsen in 2-3 days      Annisha Baar, Grayce Sessions, MD 07/10/17 1753

## 2017-07-10 NOTE — ED Triage Notes (Signed)
Pt c/o diarrhea with moderate amount bright red blood and mucus in stool, intermittent diffuse cramping abdominal pain onset Saturday. No nausea or emesis. No anticoagulants.

## 2017-07-10 NOTE — Telephone Encounter (Signed)
I agree, pt should be seen at ED for lower GI bleeding

## 2017-07-10 NOTE — Telephone Encounter (Signed)
Spoke with pt, at first she was hesitant in going to the ED but I explained to her we strongly urge her to go since the diarrhea is bloody and not normal. I explained that we didn't have any openings here and that the ED would have more availability in what type of test they could do for her to see what the underlying cause is there. She finally agreed that she would be seen at the ED because at first she was going to try to "wait it out" and I informed her it was her decision to go or not go but she would be going against medical advise if she was not seen at the ED for those symptoms.

## 2017-07-12 ENCOUNTER — Other Ambulatory Visit: Payer: Self-pay | Admitting: Internal Medicine

## 2017-07-12 DIAGNOSIS — Z1231 Encounter for screening mammogram for malignant neoplasm of breast: Secondary | ICD-10-CM

## 2017-07-17 ENCOUNTER — Other Ambulatory Visit: Payer: Self-pay | Admitting: Cardiology

## 2017-07-19 ENCOUNTER — Ambulatory Visit (INDEPENDENT_AMBULATORY_CARE_PROVIDER_SITE_OTHER): Payer: Medicare Other | Admitting: Physician Assistant

## 2017-07-19 ENCOUNTER — Encounter: Payer: Self-pay | Admitting: Physician Assistant

## 2017-07-19 VITALS — BP 130/88 | HR 64 | Ht 64.5 in | Wt 140.0 lb

## 2017-07-19 DIAGNOSIS — I10 Essential (primary) hypertension: Secondary | ICD-10-CM | POA: Diagnosis not present

## 2017-07-19 DIAGNOSIS — I428 Other cardiomyopathies: Secondary | ICD-10-CM | POA: Diagnosis not present

## 2017-07-19 DIAGNOSIS — E785 Hyperlipidemia, unspecified: Secondary | ICD-10-CM | POA: Diagnosis not present

## 2017-07-19 DIAGNOSIS — I2510 Atherosclerotic heart disease of native coronary artery without angina pectoris: Secondary | ICD-10-CM

## 2017-07-19 DIAGNOSIS — I779 Disorder of arteries and arterioles, unspecified: Secondary | ICD-10-CM

## 2017-07-19 DIAGNOSIS — I739 Peripheral vascular disease, unspecified: Secondary | ICD-10-CM

## 2017-07-19 MED ORDER — LISINOPRIL 20 MG PO TABS: 20.0000 mg | ORAL_TABLET | Freq: Every day | ORAL | 3 refills | Status: DC

## 2017-07-19 MED ORDER — PRAVASTATIN SODIUM 80 MG PO TABS: 80.0000 mg | ORAL_TABLET | Freq: Every day | ORAL | 1 refills | Status: DC

## 2017-07-19 MED ORDER — CARVEDILOL 25 MG PO TABS
25.0000 mg | ORAL_TABLET | Freq: Two times a day (BID) | ORAL | 3 refills | Status: DC
Start: 2017-07-19 — End: 2018-08-09

## 2017-07-19 NOTE — Patient Instructions (Signed)
Medication Instructions:   No changes. Refills have been sent to your pharmacy.  Labwork:   None ordered.    Testing/Procedures:  none  Follow-Up:  1 year with Dr. Percival Spanish   If you need a refill on your cardiac medications before your next appointment, please call your pharmacy.

## 2017-07-19 NOTE — Progress Notes (Signed)
Cardiology Office Note    Date:  07/20/2017   ID:  Carol Lawrence, DOB 11/20/1936, MRN [id number]  PCP:  Biagio Borg, MD  Cardiologist:  Dr. Percival Spanish  Chief Complaint  Patient presents with  . Follow-up    seen for Dr. Percival Spanish, h/o NICM    History of Present Illness:  Carol Lawrence is a 81 y.o. female with PMH of NICM, DVT, HTN, HLD, CAD and Carotid artery disease. She had mild nonobstructive disease on cath in 2011. She has a history of ejection fraction as low as 15% which later improved to 55%. Last echocardiogram obtained on 05/19/2015 showed EF 40-45%, diffuse hypokinesis, mild LVH, grade 1 diastolic dysfunction. She was last seen by Dr. Percival Spanish on 05/12/2016, her carvedilol was increased to 25 mg BID.  She presents today to cardiology office for follow-up, she denies any significant shortness of breath or exertional chest discomfort. She appears to be euvolemic on physical exam. Current blood pressure medication including carvedilol and lisinopril is controlling her blood pressure quite well. She is due for another fasting lipid panel near the end of this year, will defer to primary care provider. Otherwise she has been doing very well from cardiology perspective. She denies any significant orthopnea or PND.    Past Medical History:  Diagnosis Date  . Allergic rhinitis   . Bell's palsy   . Cancer ([place])    [phone]  . Cardiomyopathy, dilated ([place]) 08/26/2011   15% 2011, 55% 2012  . Carotid stenosis 09/12/2012   Bilateral, mild Lifeline screening  . CHF (congestive heart failure) ([place])   . Clotting disorder ([place])   . Colonic polyp   . DVT (deep venous thrombosis) ([place])   . Glucose intolerance (impaired glucose tolerance)   . HLD (hyperlipidemia)   . HTN (hypertension)   . NICM (nonischemic cardiomyopathy) ([place])    Coronaries; left main essentially was nonexistent or very short with  . PVD (peripheral vascular disease) ([place])     Past Surgical History:    Procedure Laterality Date  . COLONOSCOPY    . hysterectomy - unknown type    . LUMBAR FUSION  1983  . OOPHORECTOMY    . POLYPECTOMY    . TUBAL LIGATION      Current Medications: Outpatient Medications Prior to Visit  Medication Sig Dispense Refill  . aspirin 81 MG tablet Take 81 mg by mouth daily.      . Calcium Carbonate-Vitamin D (CALCIUM 600+D) 600-400 MG-UNIT per tablet Take 1 tablet by mouth daily.      . Multiple Vitamin (MULTIVITAMIN) tablet Take 1 tablet by mouth daily.      . carvedilol (COREG) 25 MG tablet Take 1 tablet (25 mg total) by mouth 2 (two) times daily. 60 tablet 0  . lisinopril (PRINIVIL,ZESTRIL) 20 MG tablet TAKE 1 TABLET BY MOUTH EVERY DAY 30 tablet 0  . pravastatin (PRAVACHOL) 80 MG tablet Take 1 tablet (80 mg total) by mouth at bedtime. NEED OV. 90 tablet 0  . furosemide (LASIX) 20 MG tablet Take 1 tablet (20 mg total) by mouth daily. (Patient not taking: Reported on 07/10/2017) 90 tablet 2  . lisinopril (PRINIVIL,ZESTRIL) 20 MG tablet TAKE 1 TABLET (20 MG TOTAL) BY MOUTH DAILY. PLEASE MAKE APPOINTMENT FOR FURTHER REFILLS. 15 tablet 0   Facility-Administered Medications Prior to Visit  Medication Dose Route Frequency Provider Last Rate Last Dose  . 0.9 %  sodium chloride infusion  500 mL Intravenous Continuous Milus Banister, MD  Allergies:   Amoxicillin and Codeine   Social History   Social History  . Marital status: Married    Spouse name: N/A  . Number of children: 2  . Years of education: N/A   Occupational History  . retired     Manufacturing engineer   Social History Main Topics  . Smoking status: Former Smoker    Quit date: 12/19/1980  . Smokeless tobacco: Never Used  . Alcohol use No  . Drug use: Unknown  . Sexual activity: Not Asked   Other Topics Concern  . None   Social History Narrative  . None     Family History:  The patient's family history includes COPD in her mother and unknown relative; Colon polyps in her brother  and sister; Coronary artery disease in her unknown relative; Hyperlipidemia in her unknown relative; Leukemia in her sister, sister, and unknown relative; Liver cancer in her unknown relative; Lung cancer in her unknown relative; Melanoma in her unknown relative; Osteoporosis in her mother and unknown relative.   ROS:   Please see the history of present illness.    ROS All other systems reviewed and are negative.   PHYSICAL EXAM:   VS:  BP 130/88   Pulse 64   Ht 5' 4.5" (1.638 m)   Wt 140 lb (63.5 kg)   SpO2 98%   BMI 23.66 kg/m    GEN: Well nourished, well developed, in no acute distress  HEENT: normal  Neck: no JVD, carotid bruits, or masses Cardiac: RRR; no murmurs, rubs, or gallops,no edema  Respiratory:  clear to auscultation bilaterally, normal work of breathing GI: soft, nontender, nondistended, + BS MS: no deformity or atrophy  Skin: warm and dry, no rash Neuro:  Alert and Oriented x 3, Strength and sensation are intact Psych: euthymic mood, full affect  Wt Readings from Last 3 Encounters:  07/19/17 140 lb (63.5 kg)  07/10/17 138 lb (62.6 kg)  04/17/17 142 lb (64.4 kg)      Studies/Labs Reviewed:   EKG:  EKG is ordered today.  The ekg ordered today demonstrates  Normal sinus rhythm, no significant ST-T wave changes  Recent Labs: 11/14/2016: TSH 2.48 07/10/2017: ALT 19; BUN 15; Creatinine, Ser 0.88; Hemoglobin 16.3; Platelets 121; Potassium 3.4; Sodium 141   Lipid Panel    Component Value Date/Time   CHOL 133 11/14/2016 1203   TRIG 166.0 (H) 11/14/2016 1203   HDL 33.00 (L) 11/14/2016 1203   CHOLHDL 4 11/14/2016 1203   VLDL 33.2 11/14/2016 1203   LDLCALC 67 11/14/2016 1203   LDLDIRECT 98.6 06/09/2010 1049    Additional studies/ records that were reviewed today include:   Echo 05/19/2015 LV EF: 40% -   45%  ------------------------------------------------------------------- Indications:      Dyspnea  (R06.00).  ------------------------------------------------------------------- History:   PMH:  Bell&'s Palsy, Deep Vein Thrombosis with [place] Filter placement, Peripheral Vascular Disease, Dilated Cardiomyopathy (EF 15% in 2011, and 55% in 2012)  Risk factors: Hypertension. Dyslipidemia.  ------------------------------------------------------------------- Study Conclusions  - Left ventricle: The cavity size was normal. Wall thickness was   increased in a pattern of mild LVH. Systolic function was mildly   to moderately reduced. The estimated ejection fraction was in the   range of 40% to 45%. Diffuse hypokinesis. Doppler parameters are   consistent with abnormal left ventricular relaxation (grade 1   diastolic dysfunction).   ASSESSMENT:    1. NICM (nonischemic cardiomyopathy) ([place])   2. Essential hypertension   3. Coronary artery  disease involving native coronary artery of native heart without angina pectoris   4. Carotid artery disease, unspecified laterality ([place])   5. Hyperlipidemia, unspecified hyperlipidemia type      PLAN:  In order of problems listed above:  1. NICM: Last ejection fraction 45%, appears to be euvolemic on physical exam. No lower extremity edema, orthopnea or PND. Will continue on current medication, does not appears to require any diuretic  2. CAD: Mild nonobstructive disease on cardiac cath 2011  3. Hypertension: Blood pressure well controlled  4. Hyperlipidemia: Due for repeat fasting lipid panel around November or December of this year. Continue Pravachol 80 mg daily  5. Carotid artery disease: Although she carries this diagnosis, I do not see any previous record of carotid Doppler. She has no significant bruit on physical exam    Medication Adjustments/Labs and Tests Ordered: Current medicines are reviewed at length with the patient today.  Concerns regarding medicines are outlined above.  Medication changes, Labs and Tests ordered  today are listed in the Patient Instructions below. Patient Instructions  Medication Instructions:   No changes. Refills have been sent to your pharmacy.  Labwork:   None ordered.    Testing/Procedures:  none  Follow-Up:  1 year with Dr. Percival Spanish   If you need a refill on your cardiac medications before your next appointment, please call your pharmacy.      Hilbert Corrigan, [place]  07/20/2017 11:51 PM    [facility] [address], [place], [place]  [address] Phone: [phone]; Fax: [phone]

## 2017-07-20 ENCOUNTER — Encounter: Payer: Self-pay | Admitting: Physician Assistant

## 2017-08-11 ENCOUNTER — Other Ambulatory Visit: Payer: Self-pay | Admitting: Cardiology

## 2017-08-14 ENCOUNTER — Ambulatory Visit: Payer: Medicare Other

## 2017-08-16 ENCOUNTER — Ambulatory Visit
Admission: RE | Admit: 2017-08-16 | Discharge: 2017-08-16 | Disposition: A | Discharge status: Home / Self Care | Payer: Medicare Other | Source: Ambulatory Visit | Attending: Internal Medicine | Admitting: Internal Medicine

## 2017-08-16 DIAGNOSIS — Z1231 Encounter for screening mammogram for malignant neoplasm of breast: Secondary | ICD-10-CM | POA: Diagnosis not present

## 2017-08-18 DIAGNOSIS — H2513 Age-related nuclear cataract, bilateral: Secondary | ICD-10-CM | POA: Diagnosis not present

## 2017-08-24 ENCOUNTER — Ambulatory Visit (INDEPENDENT_AMBULATORY_CARE_PROVIDER_SITE_OTHER): Payer: Medicare Other | Admitting: Internal Medicine

## 2017-08-24 ENCOUNTER — Encounter: Payer: Self-pay | Admitting: Internal Medicine

## 2017-08-24 ENCOUNTER — Ambulatory Visit (INDEPENDENT_AMBULATORY_CARE_PROVIDER_SITE_OTHER)
Admission: RE | Admit: 2017-08-24 | Discharge: 2017-08-24 | Disposition: A | Discharge status: Home / Self Care | Payer: Medicare Other | Source: Ambulatory Visit | Attending: Internal Medicine | Admitting: Internal Medicine

## 2017-08-24 VITALS — BP 154/82 | HR 77 | Temp 98.4°F | Ht 64.5 in | Wt 141.0 lb

## 2017-08-24 DIAGNOSIS — R0600 Dyspnea, unspecified: Secondary | ICD-10-CM

## 2017-08-24 DIAGNOSIS — I10 Essential (primary) hypertension: Secondary | ICD-10-CM

## 2017-08-24 DIAGNOSIS — R05 Cough: Secondary | ICD-10-CM

## 2017-08-24 DIAGNOSIS — R079 Chest pain, unspecified: Secondary | ICD-10-CM | POA: Diagnosis not present

## 2017-08-24 DIAGNOSIS — R059 Cough, unspecified: Secondary | ICD-10-CM

## 2017-08-24 MED ORDER — HYDROCODONE-HOMATROPINE 5-1.5 MG/5ML PO SYRP: 5.0000 mL | ORAL_SOLUTION | Freq: Four times a day (QID) | ORAL | 0 refills | Status: DC | PRN

## 2017-08-24 MED ORDER — HYDROCODONE-HOMATROPINE 5-1.5 MG/5ML PO SYRP: 5.0000 mL | ORAL_SOLUTION | Freq: Four times a day (QID) | ORAL | 0 refills | Status: AC | PRN

## 2017-08-24 MED ORDER — AZITHROMYCIN 250 MG PO TABS: ORAL_TABLET | ORAL | 1 refills | Status: DC

## 2017-08-24 NOTE — Progress Notes (Signed)
Subjective:    Patient ID: Carol Lawrence, female    DOB: 05/10/1936, 81 y.o.   MRN: [id number]  HPI  Here with acute onset mild to mod 2-3 days ST, HA, general weakness and malaise, with prod cough greenish sputum and mild sob and diffuse anterior chest pain (dull, mild, intermittent without radation or assoc symptoms), but Pt denies wheezing, orthopnea, PND, increased LE swelling, palpitations, dizziness or syncope.  Pt denies new neurological symptoms such as new headache, or facial or extremity weakness or numbness   Pt denies polydipsia, polyuria Past Medical History:  Diagnosis Date  . Allergic rhinitis   . Bell's palsy   . Cancer ([place])    [phone]  . Cardiomyopathy, dilated ([place]) 08/26/2011   15% 2011, 55% 2012  . Carotid stenosis 09/12/2012   Bilateral, mild Lifeline screening  . CHF (congestive heart failure) ([place])   . Clotting disorder ([place])   . Colonic polyp   . DVT (deep venous thrombosis) ([place])   . Glucose intolerance (impaired glucose tolerance)   . HLD (hyperlipidemia)   . HTN (hypertension)   . NICM (nonischemic cardiomyopathy) ([place])    Coronaries; left main essentially was nonexistent or very short with  . PVD (peripheral vascular disease) ([place])    Past Surgical History:  Procedure Laterality Date  . COLONOSCOPY    . hysterectomy - unknown type    . LUMBAR FUSION  1983  . OOPHORECTOMY    . POLYPECTOMY    . TUBAL LIGATION      reports that she quit smoking about 36 years ago. She has never used smokeless tobacco. She reports that she does not drink alcohol. Her drug history is not on file. family history includes COPD in her mother and unknown relative; Colon polyps in her brother and sister; Coronary artery disease in her unknown relative; Hyperlipidemia in her unknown relative; Leukemia in her sister, sister, and unknown relative; Liver cancer in her unknown relative; Lung cancer in her unknown relative; Melanoma in her unknown relative; Osteoporosis in  her mother and unknown relative. Allergies  Allergen Reactions  . Amoxicillin Itching    Curam---from [place]  . Codeine Itching   Current Outpatient Prescriptions on File Prior to Visit  Medication Sig Dispense Refill  . aspirin 81 MG tablet Take 81 mg by mouth daily.      . Calcium Carbonate-Vitamin D (CALCIUM 600+D) 600-400 MG-UNIT per tablet Take 1 tablet by mouth daily.      . carvedilol (COREG) 25 MG tablet Take 1 tablet (25 mg total) by mouth 2 (two) times daily. 180 tablet 3  . lisinopril (PRINIVIL,ZESTRIL) 20 MG tablet Take 1 tablet (20 mg total) by mouth daily. 90 tablet 3  . Multiple Vitamin (MULTIVITAMIN) tablet Take 1 tablet by mouth daily.      . pravastatin (PRAVACHOL) 80 MG tablet Take 1 tablet (80 mg total) by mouth at bedtime. 90 tablet 1   Current Facility-Administered Medications on File Prior to Visit  Medication Dose Route Frequency Provider Last Rate Last Dose  . 0.9 %  sodium chloride infusion  500 mL Intravenous Continuous Milus Banister, MD       Review of Systems  Constitutional: Negative for other unusual diaphoresis or sweats HENT: Negative for ear discharge or swelling Eyes: Negative for other worsening visual disturbances Respiratory: Negative for stridor or other swelling  Gastrointestinal: Negative for worsening distension or other blood Genitourinary: Negative for retention or other urinary change Musculoskeletal: Negative for other MSK pain  or swelling Skin: Negative for color change or other new lesions Neurological: Negative for worsening tremors and other numbness  Psychiatric/Behavioral: Negative for worsening agitation or other fatigue All other system neg per pt    Objective:   Physical Exam BP (!) 154/82   Pulse 77   Temp 98.4 F (36.9 C) (Oral)   Ht 5' 4.5" (1.638 m)   Wt 141 lb (64 kg)   SpO2 99%   BMI 23.83 kg/m  VS noted, mild ill appearing Constitutional: Pt appears in NAD HENT: Head: NCAT.  Right Ear: External ear  normal.  Left Ear: External ear normal.  Eyes: . Pupils are equal, round, and reactive to light. Conjunctivae and EOM are normal Nose: without d/c or deformity Bilat tm's with mild erythema.  Max sinus areas non tender.  Pharynx with mild erythema, no exudate Neck: Neck supple. Gross normal ROM Cardiovascular: Normal rate and regular rhythm.   Pulmonary/Chest: Effort normal and breath sounds decresaed without rales or wheezing.  Neurological: Pt is alert. At baseline orientation, motor grossly intact Skin: Skin is warm. No rashes, other new lesions, no LE edema Psychiatric: Pt behavior is normal without agitation  No other exam findings       Assessment & Plan:

## 2017-08-24 NOTE — Patient Instructions (Signed)
Please take all new medication as prescribed - the antibiotic, and cough medicine if needed  Please continue all other medications as before, and refills have been done if requested.  Please have the pharmacy call with any other refills you may need.  Please keep your appointments with your specialists as you may have planned  Please go to the XRAY Department in the Basement (go straight as you get off the elevator) for the x-ray testing  You will be contacted by phone if any changes need to be made immediately.  Otherwise, you will receive a letter about your results with an explanation, but please check with MyChart first.  Please remember to sign up for MyChart if you have not done so, as this will be important to you in the future with finding out test results, communicating by private email, and scheduling acute appointments online when needed.  If you have Medicare related insurance (such as traditional Medicare, [employer] or [employer], or similar), Please make an appointment at the [facility] with Sharee Pimple, the [employer], for your Wellness Visit in this office, which is a benefit with your insurance.

## 2017-08-24 NOTE — Assessment & Plan Note (Signed)
Mild to mod, c/w bronchitis vs pna, for cxr, also for antibx course, cough med prn, to f/u any worsening symptoms or concerns

## 2017-08-24 NOTE — Assessment & Plan Note (Signed)
Mild uncontrolled, likely reactive, ok to cont same tx,  to f/u any worsening symptoms or concerns

## 2017-08-24 NOTE — Assessment & Plan Note (Signed)
Also for cxr as above, no wheezing on exam and pt declines inhaler prn

## 2017-08-30 DIAGNOSIS — Z01419 Encounter for gynecological examination (general) (routine) without abnormal findings: Secondary | ICD-10-CM | POA: Diagnosis not present

## 2017-08-30 DIAGNOSIS — Z6823 Body mass index (BMI) 23.0-23.9, adult: Secondary | ICD-10-CM | POA: Diagnosis not present

## 2017-08-30 DIAGNOSIS — Z779 Other contact with and (suspected) exposures hazardous to health: Secondary | ICD-10-CM | POA: Diagnosis not present

## 2017-09-07 ENCOUNTER — Ambulatory Visit (INDEPENDENT_AMBULATORY_CARE_PROVIDER_SITE_OTHER): Payer: Medicare Other | Admitting: General Practice

## 2017-09-07 DIAGNOSIS — Z23 Encounter for immunization: Secondary | ICD-10-CM

## 2017-10-02 ENCOUNTER — Telehealth: Payer: Self-pay | Admitting: Internal Medicine

## 2017-10-02 NOTE — Telephone Encounter (Signed)
Patient Name: Carol Lawrence  DOB: [birth year]    Initial Comment Caller states c/o bloody stool and abdominal pain.    Nurse Assessment  Nurse: Joline Salt, RN, Malachy Mood Date/Time Eilene Ghazi Time): 10/02/2017 1:42:37 PM  Confirm and document reason for call. If symptomatic, describe symptoms. ---Caller states she is having bloody stools and abdominal pain since 1am. She ate some candy coated nuts. She has diverticulosis.  Does the patient have any new or worsening symptoms? ---Yes  Will a triage be completed? ---Yes  Related visit to physician within the last 2 weeks? ---N/A  Does the PT have any chronic conditions? (i.e. diabetes, asthma, etc.) ---Yes  List chronic conditions. ---diverticulitis  Is this a behavioral health or substance abuse call? ---No     Guidelines    Guideline Title Affirmed Question Affirmed Notes  Rectal Bleeding [1] MODERATE rectal bleeding (small blood clots, passing blood without stool, or toilet water turns red) AND [2] more than once a day    Final Disposition User   Go to ED Now Joline Salt, RN, Malachy Mood    Comments  Strongly recommended patient to go to ED now but patient refused, stating she wants to go lie down. She does not want to wait in the ED for hours. I recommended she go now as this could be a life threatening situation if stool content were leaking abdominal area. Caller still refused. She stated she would go to MD office but not ED.   Referrals  GO TO FACILITY REFUSED   Caller Disagree/Comply Disagree  Caller Understands Yes  PreDisposition Did not know what to do

## 2017-10-03 ENCOUNTER — Ambulatory Visit (INDEPENDENT_AMBULATORY_CARE_PROVIDER_SITE_OTHER): Payer: Medicare Other | Admitting: Internal Medicine

## 2017-10-03 ENCOUNTER — Other Ambulatory Visit (INDEPENDENT_AMBULATORY_CARE_PROVIDER_SITE_OTHER): Payer: Medicare Other

## 2017-10-03 ENCOUNTER — Encounter: Payer: Self-pay | Admitting: Internal Medicine

## 2017-10-03 DIAGNOSIS — K921 Melena: Secondary | ICD-10-CM | POA: Diagnosis not present

## 2017-10-03 DIAGNOSIS — R1032 Left lower quadrant pain: Secondary | ICD-10-CM

## 2017-10-03 DIAGNOSIS — R109 Unspecified abdominal pain: Secondary | ICD-10-CM | POA: Insufficient documentation

## 2017-10-03 LAB — URINALYSIS, ROUTINE W REFLEX MICROSCOPIC
Bilirubin Urine: NEGATIVE
HGB URINE DIPSTICK: NEGATIVE
KETONES UR: NEGATIVE
Leukocytes, UA: NEGATIVE
NITRITE: NEGATIVE
RBC / HPF: NONE SEEN (ref 0–?)
Specific Gravity, Urine: 1.02 (ref 1.000–1.030)
Total Protein, Urine: NEGATIVE
URINE GLUCOSE: NEGATIVE
UROBILINOGEN UA: 0.2 (ref 0.0–1.0)
pH: 7 (ref 5.0–8.0)

## 2017-10-03 LAB — BASIC METABOLIC PANEL
BUN: 14 mg/dL (ref 6–23)
CALCIUM: 9.5 mg/dL (ref 8.4–10.5)
CO2: 30 mEq/L (ref 19–32)
CREATININE: 0.99 mg/dL (ref 0.40–1.20)
Chloride: 105 mEq/L (ref 96–112)
GFR: 57.51 mL/min — AB (ref >60.00)
GLUCOSE: 114 mg/dL — AB (ref 70–99)
POTASSIUM: 3.5 meq/L (ref 3.5–5.1)
Sodium: 144 mEq/L (ref 135–145)

## 2017-10-03 LAB — CBC WITH DIFFERENTIAL/PLATELET
Basophils Absolute: 0.1 10*3/uL (ref 0.0–0.1)
Basophils Relative: 1.1 % (ref 0.0–3.0)
EOS PCT: 1.7 % (ref 0.0–5.0)
Eosinophils Absolute: 0.1 10*3/uL (ref 0.0–0.7)
HEMATOCRIT: 45.3 % (ref 36.0–46.0)
HEMOGLOBIN: 15.1 g/dL — AB (ref 12.0–15.0)
LYMPHS PCT: 18.3 % (ref 12.0–46.0)
Lymphs Abs: 1.4 10*3/uL (ref 0.7–4.0)
MCHC: 33.3 g/dL (ref 30.0–36.0)
MCV: 90.7 fl (ref 78.0–100.0)
MONO ABS: 0.6 10*3/uL (ref 0.1–1.0)
Monocytes Relative: 7.7 % (ref 3.0–12.0)
Neutro Abs: 5.4 10*3/uL (ref 1.4–7.7)
Neutrophils Relative %: 71.2 % (ref 43.0–77.0)
Platelets: 132 10*3/uL — ABNORMAL LOW (ref 150.0–400.0)
RBC: 4.99 Mil/uL (ref 3.87–5.11)
RDW: 14.2 % (ref 11.5–15.5)
WBC: 7.6 10*3/uL (ref 4.0–10.5)

## 2017-10-03 LAB — HEPATIC FUNCTION PANEL
ALBUMIN: 3.9 g/dL (ref 3.5–5.2)
ALT: 14 U/L (ref 0–35)
AST: 14 U/L (ref 0–37)
Alkaline Phosphatase: 57 U/L (ref 39–117)
Bilirubin, Direct: 0.1 mg/dL (ref 0.0–0.3)
Total Bilirubin: 0.7 mg/dL (ref 0.2–1.2)
Total Protein: 6.5 g/dL (ref 6.0–8.3)

## 2017-10-03 LAB — LIPASE: LIPASE: 32 U/L (ref 11.0–59.0)

## 2017-10-03 MED ORDER — CIPROFLOXACIN HCL 500 MG PO TABS: 500.0000 mg | ORAL_TABLET | Freq: Two times a day (BID) | ORAL | 0 refills | Status: AC

## 2017-10-03 MED ORDER — METRONIDAZOLE 250 MG PO TABS: 250.0000 mg | ORAL_TABLET | Freq: Three times a day (TID) | ORAL | 0 refills | Status: AC

## 2017-10-03 NOTE — Assessment & Plan Note (Signed)
C/w LGI bleeding, differential includes ischemic colitis, diverticulitis or diverticulosis related bleeding.  For labs today, will possibly need serial cbc to further document; consider GI referral, cont all medication as above

## 2017-10-03 NOTE — Assessment & Plan Note (Signed)
Left sided, mostly LLQ it seems with mild tender on exam today, suspect episode ischemic colitis but cant r/o diverticulitis, for empiric antbx for now, for labs as above, and check CT Abd/pelvis today if possible,  Pt declines pain med

## 2017-10-03 NOTE — Progress Notes (Signed)
Subjective:    Patient ID: Carol Lawrence, female    DOB: October 18, 1936, 81 y.o.   MRN: [id number]  HPI  Here with c/o 3-4 days onset left sided abd pain, severe x 3 days and assoc with bowel frequency and recurring red stringy material.  Denies worsening reflux, dysphagia, n/v, Denies urinary symptoms such as dysuria, frequency, urgency, flank pain, hematuria or n/v, fever, chills.  Had a similar episode July 2018 seen in ED, Hgb stable.  Abd pain was "too bad" to go to ED yesterday, but fortunately pain and BRBPR are improved  - only saw "pink' this am.   Pt denies fever, wt loss, night sweats, loss of appetite, or other constitutional symptoms  Last colonoscopy April 2018 c/w diverticulosis.  Did have some throbbing discomfort to far lower lateral abd/pelvis for 2 wks without associated symptom. Last CT abd/pelvis was 2009 Past Medical History:  Diagnosis Date  . Allergic rhinitis   . Bell's palsy   . Cancer ([place])    [phone]  . Cardiomyopathy, dilated ([place]) 08/26/2011   15% 2011, 55% 2012  . Carotid stenosis 09/12/2012   Bilateral, mild Lifeline screening  . CHF (congestive heart failure) ([place])   . Clotting disorder ([place])   . Colonic polyp   . DVT (deep venous thrombosis) ([place])   . Glucose intolerance (impaired glucose tolerance)   . HLD (hyperlipidemia)   . HTN (hypertension)   . NICM (nonischemic cardiomyopathy) ([place])    Coronaries; left main essentially was nonexistent or very short with  . PVD (peripheral vascular disease) ([place])    Past Surgical History:  Procedure Laterality Date  . COLONOSCOPY    . hysterectomy - unknown type    . LUMBAR FUSION  1983  . OOPHORECTOMY    . POLYPECTOMY    . TUBAL LIGATION      reports that she quit smoking about 36 years ago. She has never used smokeless tobacco. She reports that she does not drink alcohol. Her drug history is not on file. family history includes COPD in her mother and unknown relative; Colon polyps in her brother and  sister; Coronary artery disease in her unknown relative; Hyperlipidemia in her unknown relative; Leukemia in her sister, sister, and unknown relative; Liver cancer in her unknown relative; Lung cancer in her unknown relative; Melanoma in her unknown relative; Osteoporosis in her mother and unknown relative. Allergies  Allergen Reactions  . Amoxicillin Itching    Curam---from [place]  . Codeine Itching   Current Outpatient Prescriptions on File Prior to Visit  Medication Sig Dispense Refill  . aspirin 81 MG tablet Take 81 mg by mouth daily.      . Calcium Carbonate-Vitamin D (CALCIUM 600+D) 600-400 MG-UNIT per tablet Take 1 tablet by mouth daily.      . carvedilol (COREG) 25 MG tablet Take 1 tablet (25 mg total) by mouth 2 (two) times daily. 180 tablet 3  . lisinopril (PRINIVIL,ZESTRIL) 20 MG tablet Take 1 tablet (20 mg total) by mouth daily. 90 tablet 3  . Multiple Vitamin (MULTIVITAMIN) tablet Take 1 tablet by mouth daily.      . pravastatin (PRAVACHOL) 80 MG tablet Take 1 tablet (80 mg total) by mouth at bedtime. 90 tablet 1   Current Facility-Administered Medications on File Prior to Visit  Medication Dose Route Frequency Provider Last Rate Last Dose  . 0.9 %  sodium chloride infusion  500 mL Intravenous Continuous Milus Banister, MD       Review  of Systems  Constitutional: Negative for other unusual diaphoresis or sweats HENT: Negative for ear discharge or swelling Eyes: Negative for other worsening visual disturbances Respiratory: Negative for stridor or other swelling  Gastrointestinal: Negative for worsening distension or other blood Genitourinary: Negative for retention or other urinary change Musculoskeletal: Negative for other MSK pain or swelling Skin: Negative for color change or other new lesions Neurological: Negative for worsening tremors and other numbness  Psychiatric/Behavioral: Negative for worsening agitation or other fatigue All other system neg per pt      Objective:   Physical Exam BP 128/82   Pulse 72   Temp 98.2 F (36.8 C) (Oral)   Ht 5' 4.5" (1.638 m)   Wt 140 lb (63.5 kg)   SpO2 98%   BMI 23.66 kg/m  VS noted, not ill appearing but with some  Constitutional: Pt appears in NAD HENT: Head: NCAT.  Right Ear: External ear normal.  Left Ear: External ear normal.  Eyes: . Pupils are equal, round, and reactive to light. Conjunctivae and EOM are normal Nose: without d/c or deformity Neck: Neck supple. Gross normal ROM Cardiovascular: Normal rate and regular rhythm.   Pulmonary/Chest: Effort normal and breath sounds without rales or wheezing.  Abd:  Soft, ND, + BS, no organomegaly with left sided/LLQ tenderness without guarding or rebound Neurological: Pt is alert. At baseline orientation, motor grossly intact Skin: Skin is warm. No rashes, other new lesions, no LE edema Psychiatric: Pt behavior is normal without agitation  No other exam findings Lab Results  Component Value Date   WBC 5.6 07/10/2017   HGB 16.3 (H) 07/10/2017   HCT 47.3 (H) 07/10/2017   PLT 121 (L) 07/10/2017   GLUCOSE 109 (H) 07/10/2017   CHOL 133 11/14/2016   TRIG 166.0 (H) 11/14/2016   HDL 33.00 (L) 11/14/2016   LDLDIRECT 98.6 06/09/2010   LDLCALC 67 11/14/2016   ALT 19 07/10/2017   AST 18 07/10/2017   NA 141 07/10/2017   K 3.4 (L) 07/10/2017   CL 109 07/10/2017   CREATININE 0.88 07/10/2017   BUN 15 07/10/2017   CO2 26 07/10/2017   TSH 2.48 11/14/2016   INR 1.0 ratio 03/25/2010   HGBA1C 5.8 09/03/2012       Assessment & Plan:

## 2017-10-03 NOTE — Patient Instructions (Addendum)
Please take all new medication as prescribed - the antibiotics  Please continue all other medications as before, and refills have been done if requested.  Please have the pharmacy call with any other refills you may need.  Please keep your appointments with your specialists as you may have planned  You will be contacted regarding the referral for: CT scan (to see [facility] now)  Please go to the LAB in the Basement (turn left off the elevator) for the tests to be done today  You will be contacted by phone if any changes need to be made immediately.  Otherwise, you will receive a letter about your results with an explanation, but please check with MyChart first.  Please remember to sign up for MyChart if you have not done so, as this will be important to you in the future with finding out test results, communicating by private email, and scheduling acute appointments online when needed.  Please return in 2 months, or sooner if needed

## 2017-10-05 ENCOUNTER — Other Ambulatory Visit: Payer: Self-pay | Admitting: Internal Medicine

## 2017-10-05 ENCOUNTER — Ambulatory Visit
Admission: RE | Admit: 2017-10-05 | Discharge: 2017-10-05 | Disposition: A | Discharge status: Home / Self Care | Payer: Medicare Other | Source: Ambulatory Visit | Attending: Internal Medicine | Admitting: Internal Medicine

## 2017-10-05 DIAGNOSIS — R160 Hepatomegaly, not elsewhere classified: Secondary | ICD-10-CM

## 2017-10-05 DIAGNOSIS — R1032 Left lower quadrant pain: Secondary | ICD-10-CM

## 2017-10-05 DIAGNOSIS — N2889 Other specified disorders of kidney and ureter: Secondary | ICD-10-CM

## 2017-10-05 DIAGNOSIS — K8020 Calculus of gallbladder without cholecystitis without obstruction: Secondary | ICD-10-CM | POA: Diagnosis not present

## 2017-10-05 MED ORDER — IOPAMIDOL (ISOVUE-300) INJECTION 61%
100.0000 mL | Freq: Once | INTRAVENOUS | Status: AC | PRN
  Administered 2017-10-05: 100 mL via INTRAVENOUS

## 2017-10-06 ENCOUNTER — Telehealth: Payer: Self-pay | Admitting: Cardiology

## 2017-10-06 ENCOUNTER — Telehealth: Payer: Self-pay

## 2017-10-06 NOTE — Telephone Encounter (Signed)
Pt given results, she understood   She believes her metroNIDAZOLE (FLAGYL) 250 MG tablet Is causing her heart to flutter and wants to know if she should continue taking it.  Please advise and call back

## 2017-10-06 NOTE — Telephone Encounter (Signed)
New message      Pt c/o medication issue:  1. Name of Medication:  metroNIDAZOLE (FLAGYL) 250 MG tablet Take 1 tablet (250 mg total) by mouth 3 (three) times daily.   ciprofloxacin (CIPRO) 500 MG tablet Take 1 tablet (500 mg total) by mouth 2 (two) times daily.     2. How are you currently taking this medication (dosage and times per day)?  As prescribed   3. Are you having a reaction (difficulty breathing--STAT)?  They are making her heart flutter   4. What is your medication issue? She has been on them 3 days and she is getting a fluttering feeling in her chest

## 2017-10-06 NOTE — Telephone Encounter (Signed)
-----   Message from Biagio Borg, MD sent at 10/05/2017  5:33 PM EDT ----- Left message on MyChart, pt to cont same tx except  The test results show that your current treatment is OK, as the CT scan does seem to show colitis, so please continue the current antibiotic treatment.    There was also found several "spots" in the liver and one "spot" in a kidney.  The significance of this is not clear, so we need to order an MRI of the abdomen to further assess and see if needs further evaluation or treatment.  You should hear from the office soon    Khori Rosevear to please inform pt, I will do order for MRI

## 2017-10-06 NOTE — Telephone Encounter (Signed)
I don't see any significant drug interactions.

## 2017-10-06 NOTE — Telephone Encounter (Signed)
Called pt, phone rung then had a "dead silence".

## 2017-10-06 NOTE — Telephone Encounter (Signed)
Pt of Dr. Percival Spanish  I spoke w patient. She's also a patient of Dr. Jenny Reichmann and was prescribed 2 antibiotics. She's been on the flagyl and cipro for 3 days. She notes she gets a "heart fluttering" sensation after taking these medications, thinks the effect is related to one or both of these.  She's not having any other symptoms. Denies any SOB, chest pain, dizziness, etc. She notes that the symptom of fluttering happens after taking the meds and resolves consistently after 20 mins.  Recommended patient continue current therapy. Informed her I would cehck w pharmD for any med interactions, will route to Dr. Percival Spanish as well for advice/FYI. Pt verbalized understanding and thanks.

## 2017-10-06 NOTE — Telephone Encounter (Signed)
Pt has been informed and expressed understanding.  

## 2017-10-06 NOTE — Telephone Encounter (Signed)
Left msg to call.

## 2017-10-06 NOTE — Telephone Encounter (Signed)
This would not be a typical side effect of flagyl  OK to continue  Please consider [facility] Clinic in the AM if she would want ECG and exam

## 2017-10-11 NOTE — Telephone Encounter (Signed)
Spoke with pt, aware of dr hochrein's recommendations. Patient reports feeling fine now, she does not need anything at this time.

## 2017-10-19 ENCOUNTER — Ambulatory Visit
Admission: RE | Admit: 2017-10-19 | Discharge: 2017-10-19 | Disposition: A | Discharge status: Home / Self Care | Payer: Medicare Other | Source: Ambulatory Visit | Attending: Internal Medicine | Admitting: Internal Medicine

## 2017-10-19 DIAGNOSIS — K8020 Calculus of gallbladder without cholecystitis without obstruction: Secondary | ICD-10-CM | POA: Diagnosis not present

## 2017-10-19 DIAGNOSIS — R160 Hepatomegaly, not elsewhere classified: Secondary | ICD-10-CM

## 2017-10-19 DIAGNOSIS — N2889 Other specified disorders of kidney and ureter: Secondary | ICD-10-CM

## 2017-10-19 MED ORDER — GADOBENATE DIMEGLUMINE 529 MG/ML IV SOLN
12.0000 mL | Freq: Once | INTRAVENOUS | Status: AC | PRN
  Administered 2017-10-19: 12 mL via INTRAVENOUS

## 2017-10-20 ENCOUNTER — Encounter: Payer: Self-pay | Admitting: Internal Medicine

## 2017-10-20 ENCOUNTER — Ambulatory Visit (INDEPENDENT_AMBULATORY_CARE_PROVIDER_SITE_OTHER): Payer: Medicare Other | Admitting: Internal Medicine

## 2017-10-20 VITALS — BP 138/74 | HR 74 | Temp 98.5°F | Ht 64.5 in | Wt 139.0 lb

## 2017-10-20 DIAGNOSIS — R1032 Left lower quadrant pain: Secondary | ICD-10-CM

## 2017-10-20 NOTE — Progress Notes (Signed)
Subjective:    Patient ID: Carol Lawrence, female    DOB: Jun 07, 1936, 81 y.o.   MRN: [id number]  HPI  Here to f/u discussion of recent CT and MRI results most recently done after episode of abd pain as per last visit Oct 03, 2017, with pain resolved and o/w Denies worsening reflux, abd pain, dysphagia, n/v, bowel change or blood.  She is concerned that there may be underlying malignancy or other problem that may be . Pt denies chest pain, increased sob or doe, wheezing, orthopnea, PND, increased LE swelling, palpitations, dizziness or syncope.   Pt denies polydipsia, polyuria Past Medical History:  Diagnosis Date  . Allergic rhinitis   . Bell's palsy   . Cancer ([place])    [phone]  . Cardiomyopathy, dilated ([place]) 08/26/2011   15% 2011, 55% 2012  . Carotid stenosis 09/12/2012   Bilateral, mild Lifeline screening  . CHF (congestive heart failure) ([place])   . Clotting disorder ([place])   . Colonic polyp   . DVT (deep venous thrombosis) ([place])   . Glucose intolerance (impaired glucose tolerance)   . HLD (hyperlipidemia)   . HTN (hypertension)   . NICM (nonischemic cardiomyopathy) ([place])    Coronaries; left main essentially was nonexistent or very short with  . PVD (peripheral vascular disease) ([place])    Past Surgical History:  Procedure Laterality Date  . COLONOSCOPY    . hysterectomy - unknown type    . LUMBAR FUSION  1983  . OOPHORECTOMY    . POLYPECTOMY    . TUBAL LIGATION      reports that she quit smoking about 36 years ago. she has never used smokeless tobacco. She reports that she does not drink alcohol. Her drug history is not on file. family history includes COPD in her mother and unknown relative; Colon polyps in her brother and sister; Coronary artery disease in her unknown relative; Hyperlipidemia in her unknown relative; Leukemia in her sister, sister, and unknown relative; Liver cancer in her unknown relative; Lung cancer in her unknown relative; Melanoma in her unknown  relative; Osteoporosis in her mother and unknown relative. Allergies  Allergen Reactions  . Amoxicillin Itching    Curam---from [facility]  . Codeine Itching   Current Outpatient Medications on File Prior to Visit  Medication Sig Dispense Refill  . aspirin 81 MG tablet Take 81 mg by mouth daily.      . Calcium Carbonate-Vitamin D (CALCIUM 600+D) 600-400 MG-UNIT per tablet Take 1 tablet by mouth daily.      . carvedilol (COREG) 25 MG tablet Take 1 tablet (25 mg total) by mouth 2 (two) times daily. 180 tablet 3  . lisinopril (PRINIVIL,ZESTRIL) 20 MG tablet Take 1 tablet (20 mg total) by mouth daily. 90 tablet 3  . Multiple Vitamin (MULTIVITAMIN) tablet Take 1 tablet by mouth daily.      . pravastatin (PRAVACHOL) 80 MG tablet Take 1 tablet (80 mg total) by mouth at bedtime. 90 tablet 1   Current Facility-Administered Medications on File Prior to Visit  Medication Dose Route Frequency Provider Last Rate Last Dose  . 0.9 %  sodium chloride infusion  500 mL Intravenous Continuous Milus Banister, MD       Review of Systems All otherwise neg per pt    Objective:   Physical Exam BP 138/74   Pulse 74   Temp 98.5 F (36.9 C) (Oral)   Ht 5' 4.5" (1.638 m)   Wt 139 lb (63 kg)  SpO2 97%   BMI 23.49 kg/m  VS noted,  Constitutional: Pt appears in NAD HENT: Head: NCAT.  Right Ear: External ear normal.  Left Ear: External ear normal.  Eyes: . Pupils are equal, round, and reactive to light. Conjunctivae and EOM are normal Nose: without d/c or deformity Neck: Neck supple. Gross normal ROM Cardiovascular: Normal rate and regular rhythm.   Pulmonary/Chest: Effort normal and breath sounds without rales or wheezing.  Abd:  Soft, NT, ND, + BS, no organomegaly Neurological: Pt is alert. At baseline orientation, motor grossly intact Skin: Skin is warm. No rashes, other new lesions, no LE edema Psychiatric: Pt behavior is normal without agitation  No other exam findings  CT ABDOMEN AND PELVIS  WITH CONTRAST - 10-05-2017 - impressions only IMPRESSION: Wall thickening of distal colon and descending colon to rectum favoring colitis, question infection versus inflammatory bowel disease.  Abnormal appearing liver question geographic fatty infiltration versus cirrhosis.  Patchy areas of higher attenuation may represent areas of focal sparing no enhancement is not excluded particularly at the anterolateral liver ; recommend followup non emergent MR imaging with and without contrast to exclude underlying mass lesion.  Cholelithiasis.  New partially enhancing 7 x 6 mm RIGHT renal nodule, cannot exclude tumor, can be simultaneously assessed by MR follow-up.  Aortic Atherosclerosis (ICD10-I70.0).  MRI ABDOMEN WITHOUT AND WITH CONTRAST - nov-12-2016 - impressions only IMPRESSION: Left hepatic lobe atrophy with mild dilatation of intrahepatic bile ducts. No radiographic evidence of mass or other obstructing etiology.  Sub-cm benign Bosniak category 2 right renal cyst corresponding with lesions seen on previous CT. No evidence of renal neoplasm.  Cholelithiasis.  No radiographic evidence of cholecystitis.  Lab Results  Component Value Date   WBC 7.6 10/03/2017   HGB 15.1 (H) 10/03/2017   HCT 45.3 10/03/2017   PLT 132.0 (L) 10/03/2017   GLUCOSE 114 (H) 10/03/2017   CHOL 133 11/14/2016   TRIG 166.0 (H) 11/14/2016   HDL 33.00 (L) 11/14/2016   LDLDIRECT 98.6 06/09/2010   LDLCALC 67 11/14/2016   ALT 14 10/03/2017   AST 14 10/03/2017   NA 144 10/03/2017   K 3.5 10/03/2017   CL 105 10/03/2017   CREATININE 0.99 10/03/2017   BUN 14 10/03/2017   CO2 30 10/03/2017   TSH 2.48 11/14/2016   INR 1.0 ratio 03/25/2010   HGBA1C 5.8 09/03/2012      Assessment & Plan:

## 2017-10-22 ENCOUNTER — Encounter: Payer: Self-pay | Admitting: Internal Medicine

## 2017-10-22 NOTE — Patient Instructions (Signed)
Please continue all other medications as before, and refills have been done if requested.  Please have the pharmacy call with any other refills you may need.  Please continue your efforts at being more active, low cholesterol diet, and weight control.  Please keep your appointments with your specialists as you may have planned     

## 2017-10-22 NOTE — Assessment & Plan Note (Signed)
Etiology unclear, recent CT and MRI without significant acute abnormality, imaging and labs revewed with pt and all questions answered.  No further evaluation or tx at this time

## 2017-11-27 ENCOUNTER — Encounter: Payer: Medicare Other | Admitting: Internal Medicine

## 2017-12-04 ENCOUNTER — Other Ambulatory Visit (INDEPENDENT_AMBULATORY_CARE_PROVIDER_SITE_OTHER): Payer: Medicare Other

## 2017-12-04 ENCOUNTER — Telehealth: Payer: Self-pay

## 2017-12-04 ENCOUNTER — Ambulatory Visit (INDEPENDENT_AMBULATORY_CARE_PROVIDER_SITE_OTHER): Payer: Medicare Other | Admitting: Internal Medicine

## 2017-12-04 ENCOUNTER — Encounter: Payer: Self-pay | Admitting: Internal Medicine

## 2017-12-04 VITALS — BP 128/72 | HR 69 | Temp 97.8°F | Ht 64.5 in | Wt 143.0 lb

## 2017-12-04 DIAGNOSIS — R7302 Impaired glucose tolerance (oral): Secondary | ICD-10-CM

## 2017-12-04 DIAGNOSIS — D751 Secondary polycythemia: Secondary | ICD-10-CM

## 2017-12-04 DIAGNOSIS — Z0000 Encounter for general adult medical examination without abnormal findings: Secondary | ICD-10-CM

## 2017-12-04 LAB — LIPID PANEL
Cholesterol: 116 mg/dL (ref 0–200)
HDL: 39.1 mg/dL (ref >39.00)
LDL Cholesterol: 50 mg/dL (ref 0–99)
NONHDL: 76.52
Total CHOL/HDL Ratio: 3
Triglycerides: 134 mg/dL (ref 0.0–149.0)
VLDL: 26.8 mg/dL (ref 0.0–40.0)

## 2017-12-04 LAB — URINALYSIS, ROUTINE W REFLEX MICROSCOPIC
Bilirubin Urine: NEGATIVE
Hgb urine dipstick: NEGATIVE
Ketones, ur: NEGATIVE
Leukocytes, UA: NEGATIVE
Nitrite: NEGATIVE
SPECIFIC GRAVITY, URINE: 1.01 (ref 1.000–1.030)
Total Protein, Urine: NEGATIVE
Urine Glucose: NEGATIVE
Urobilinogen, UA: 0.2 (ref 0.0–1.0)
pH: 6.5 (ref 5.0–8.0)

## 2017-12-04 LAB — BASIC METABOLIC PANEL
BUN: 18 mg/dL (ref 6–23)
CALCIUM: 9.4 mg/dL (ref 8.4–10.5)
CO2: 30 mEq/L (ref 19–32)
Chloride: 106 mEq/L (ref 96–112)
Creatinine, Ser: 0.92 mg/dL (ref 0.40–1.20)
GFR: 62.56 mL/min (ref >60.00)
GLUCOSE: 94 mg/dL (ref 70–99)
Potassium: 3.8 mEq/L (ref 3.5–5.1)
SODIUM: 142 meq/L (ref 135–145)

## 2017-12-04 LAB — CBC WITH DIFFERENTIAL/PLATELET
Basophils Absolute: 0.1 10*3/uL (ref 0.0–0.1)
Basophils Relative: 1.1 % (ref 0.0–3.0)
Eosinophils Absolute: 0.1 10*3/uL (ref 0.0–0.7)
Eosinophils Relative: 2.3 % (ref 0.0–5.0)
HEMATOCRIT: 48.2 % — AB (ref 36.0–46.0)
HEMOGLOBIN: 16.2 g/dL — AB (ref 12.0–15.0)
LYMPHS PCT: 27.4 % (ref 12.0–46.0)
Lymphs Abs: 1.3 10*3/uL (ref 0.7–4.0)
MCHC: 33.5 g/dL (ref 30.0–36.0)
MCV: 89.3 fl (ref 78.0–100.0)
MONO ABS: 0.5 10*3/uL (ref 0.1–1.0)
MONOS PCT: 9.6 % (ref 3.0–12.0)
Neutro Abs: 2.8 10*3/uL (ref 1.4–7.7)
Neutrophils Relative %: 59.6 % (ref 43.0–77.0)
Platelets: 126 10*3/uL — ABNORMAL LOW (ref 150.0–400.0)
RBC: 5.39 Mil/uL — AB (ref 3.87–5.11)
RDW: 14.1 % (ref 11.5–15.5)
WBC: 4.8 10*3/uL (ref 4.0–10.5)

## 2017-12-04 LAB — FERRITIN: Ferritin: 69.1 ng/mL (ref 10.0–291.0)

## 2017-12-04 LAB — HEPATIC FUNCTION PANEL
ALT: 18 U/L (ref 0–35)
AST: 17 U/L (ref 0–37)
Albumin: 4.3 g/dL (ref 3.5–5.2)
Alkaline Phosphatase: 57 U/L (ref 39–117)
BILIRUBIN TOTAL: 0.7 mg/dL (ref 0.2–1.2)
Bilirubin, Direct: 0.1 mg/dL (ref 0.0–0.3)
Total Protein: 7 g/dL (ref 6.0–8.3)

## 2017-12-04 LAB — HEMOGLOBIN A1C: HEMOGLOBIN A1C: 6.4 % (ref 4.6–6.5)

## 2017-12-04 LAB — TSH: TSH: 4.14 u[IU]/mL (ref 0.35–4.50)

## 2017-12-04 MED ORDER — ZOSTER VAC RECOMB ADJUVANTED 50 MCG/0.5ML IM SUSR: 0.5000 mL | Freq: Once | INTRAMUSCULAR | 1 refills | Status: AC

## 2017-12-04 NOTE — Progress Notes (Signed)
Subjective:    Patient ID: Carol Lawrence, female    DOB: June 01, 1936, 81 y.o.   MRN: [id number]  HPI  Here for wellness and f/u;  Overall doing ok;  Pt denies Chest pain, worsening SOB, DOE, wheezing, orthopnea, PND, worsening LE edema, palpitations, dizziness or syncope.  Pt denies neurological change such as new headache, facial or extremity weakness.  Pt denies polydipsia, polyuria, or low sugar symptoms. Pt states overall good compliance with treatment and medications, good tolerability, and has been trying to follow appropriate diet.  Pt denies worsening depressive symptoms, suicidal ideation or panic. No fever, night sweats, wt loss, loss of appetite, or other constitutional symptoms.  Pt states good ability with ADL's, has low fall risk, home safety reviewed and adequate, no other significant changes in hearing or vision, and only occasionally active with exercise. Colitis resolved.  No other complaints or interval hx Past Medical History:  Diagnosis Date  . Allergic rhinitis   . Bell's palsy   . Cancer ([place])    [phone]  . Cardiomyopathy, dilated ([place]) 08/26/2011   15% 2011, 55% 2012  . Carotid stenosis 09/12/2012   Bilateral, mild Lifeline screening  . CHF (congestive heart failure) ([place])   . Clotting disorder ([place])   . Colonic polyp   . DVT (deep venous thrombosis) ([place])   . Glucose intolerance (impaired glucose tolerance)   . HLD (hyperlipidemia)   . HTN (hypertension)   . NICM (nonischemic cardiomyopathy) ([place])    Coronaries; left main essentially was nonexistent or very short with  . PVD (peripheral vascular disease) ([place])    Past Surgical History:  Procedure Laterality Date  . COLONOSCOPY    . hysterectomy - unknown type    . LUMBAR FUSION  1983  . OOPHORECTOMY    . POLYPECTOMY    . TUBAL LIGATION      reports that she quit smoking about 36 years ago. she has never used smokeless tobacco. She reports that she does not drink alcohol. Her drug history is not on  file. family history includes COPD in her mother and unknown relative; Colon polyps in her brother and sister; Coronary artery disease in her unknown relative; Hyperlipidemia in her unknown relative; Leukemia in her sister, sister, and unknown relative; Liver cancer in her unknown relative; Lung cancer in her unknown relative; Melanoma in her unknown relative; Osteoporosis in her mother and unknown relative. Allergies  Allergen Reactions  . Amoxicillin Itching    Curam---from [facility]  . Codeine Itching   Current Outpatient Medications on File Prior to Visit  Medication Sig Dispense Refill  . aspirin 81 MG tablet Take 81 mg by mouth daily.      . carvedilol (COREG) 25 MG tablet Take 1 tablet (25 mg total) by mouth 2 (two) times daily. 180 tablet 3  . lisinopril (PRINIVIL,ZESTRIL) 20 MG tablet Take 1 tablet (20 mg total) by mouth daily. 90 tablet 3  . Multiple Vitamin (MULTIVITAMIN) tablet Take 1 tablet by mouth daily.      . pravastatin (PRAVACHOL) 80 MG tablet Take 1 tablet (80 mg total) by mouth at bedtime. 90 tablet 1   No current facility-administered medications on file prior to visit.    Review of Systems Constitutional: Negative for other unusual diaphoresis, sweats, appetite or weight changes HENT: Negative for other worsening hearing loss, ear pain, facial swelling, mouth sores or neck stiffness.   Eyes: Negative for other worsening pain, redness or other visual disturbance.  Respiratory: Negative for other  stridor or swelling Cardiovascular: Negative for other palpitations or other chest pain  Gastrointestinal: Negative for worsening diarrhea or loose stools, blood in stool, distention or other pain Genitourinary: Negative for hematuria, flank pain or other change in urine volume.  Musculoskeletal: Negative for myalgias or other joint swelling.  Skin: Negative for other color change, or other wound or worsening drainage.  Neurological: Negative for other syncope or  numbness. Hematological: Negative for other adenopathy or swelling Psychiatric/Behavioral: Negative for hallucinations, other worsening agitation, SI, self-injury, or new decreased concentration All other system neg per pt    Objective:   Physical Exam BP 128/72 (BP Location: Left Arm, Patient Position: Sitting, Cuff Size: Large)   Pulse 69   Temp 97.8 F (36.6 C) (Oral)   Ht 5' 4.5" (1.638 m)   Wt 143 lb (64.9 kg)   SpO2 98%   BMI 24.17 kg/m  VS noted,  Constitutional: Pt is oriented to person, place, and time. Appears well-developed and well-nourished, in no significant distress and comfortable Head: Normocephalic and atraumatic  Eyes: Conjunctivae and EOM are normal. Pupils are equal, round, and reactive to light Right Ear: External ear normal without discharge Left Ear: External ear normal without discharge Nose: Nose without discharge or deformity Mouth/Throat: Oropharynx is without other ulcerations and moist  Neck: Normal range of motion. Neck supple. No JVD present. No tracheal deviation present or significant neck LA or mass Cardiovascular: Normal rate, regular rhythm, normal heart sounds and intact distal pulses.   Pulmonary/Chest: WOB normal and breath sounds without rales or wheezing  Abdominal: Soft. Bowel sounds are normal. NT. No HSM  Musculoskeletal: Normal range of motion. Exhibits no edema Lymphadenopathy: Has no other cervical adenopathy.  Neurological: Pt is alert and oriented to person, place, and time. Pt has normal reflexes. No cranial nerve deficit. Motor grossly intact, Gait intact Skin: Skin is warm and dry. No rash noted or new ulcerations Psychiatric:  Has normal mood and affect. Behavior is normal without agitation No other exam findings Lab Results  Component Value Date   WBC 4.8 12/04/2017   HGB 16.2 (H) 12/04/2017   HCT 48.2 (H) 12/04/2017   PLT 126.0 (L) 12/04/2017   GLUCOSE 94 12/04/2017   CHOL 116 12/04/2017   TRIG 134.0 12/04/2017   HDL  39.10 12/04/2017   LDLDIRECT 98.6 06/09/2010   LDLCALC 50 12/04/2017   ALT 18 12/04/2017   AST 17 12/04/2017   NA 142 12/04/2017   K 3.8 12/04/2017   CL 106 12/04/2017   CREATININE 0.92 12/04/2017   BUN 18 12/04/2017   CO2 30 12/04/2017   TSH 4.14 12/04/2017   INR 1.0 ratio 03/25/2010   HGBA1C 6.4 12/04/2017        Assessment & Plan:

## 2017-12-04 NOTE — Telephone Encounter (Signed)
-----   Message from Biagio Borg, MD sent at 12/04/2017 12:59 PM EST ----- Left message on MyChart, pt to cont same tx except  The test results show that your current treatment is OK, except the Hemoglobin is mildly elevated again.  I will ask the lab to check a Ferritin level, which can help determine if we need to be concerned about the hemochromatosis in your family.    Reynol Arnone to please inform pt, and if elevated, she would need referral to hematology/oncology

## 2017-12-04 NOTE — Patient Instructions (Addendum)
Your Shingles shot was sent to the pharmacy  Please continue all other medications as before, and refills have been done if requested.  Please have the pharmacy call with any other refills you may need.  Please continue your efforts at being more active, low cholesterol diet, and weight control.  You are otherwise up to date with prevention measures today.  Please keep your appointments with your specialists as you may have planned  Please go to the LAB in the Basement (turn left off the elevator) for the tests to be done today  You will be contacted by phone if any changes need to be made immediately.  Otherwise, you will receive a letter about your results with an explanation, but please check with MyChart first.  Please remember to sign up for MyChart if you have not done so, as this will be important to you in the future with finding out test results, communicating by private email, and scheduling acute appointments online when needed.  Please return in 1 year for your yearly visit, or sooner if needed, with Lab testing done 3-5 days before

## 2017-12-05 ENCOUNTER — Telehealth: Payer: Self-pay | Admitting: Internal Medicine

## 2017-12-05 NOTE — Assessment & Plan Note (Signed)

## 2017-12-05 NOTE — Assessment & Plan Note (Signed)
stable overall by history and exam, recent data reviewed with pt, and pt to continue medical treatment as before,  to f/u any worsening symptoms or concerns Lab Results  Component Value Date   HGBA1C 6.4 12/04/2017   

## 2017-12-05 NOTE — Telephone Encounter (Signed)
Copied from [place] [id number]. Topic: Quick Communication - See Telephone Encounter >> Dec 05, 2017  3:01 PM Vernona Rieger wrote: CRM for notification. See Telephone encounter for:   12/05/17.  Pt is requesting a copy of her lab work from 12/17. Call back is [phone]

## 2017-12-05 NOTE — Telephone Encounter (Signed)
Labs printed and mailed.

## 2017-12-25 ENCOUNTER — Telehealth: Payer: Self-pay | Admitting: Cardiology

## 2017-12-25 NOTE — Telephone Encounter (Signed)
Returned the call to the patient. She stated that this morning, after her medications, that her blood pressure was 104/78 and heart rate was 49. She stated that her heart rate was typically in the 60-70's and her blood pressure can run anywhere from 794-801 systolic. She does not regularly check her blood pressure. She stated that she felt a mild flutter in her chest and therefore decided to check her blood pressure. She stated that she feels fine and denies shortness of breath and chest pain. She currently takes Carvedilol 25 mg bid and lisinopril 20 mg daily. She stated that she will call back tomorrow if she has another heart rate that is running low.

## 2017-12-25 NOTE — Telephone Encounter (Signed)
New message   STAT if HR is under 50 or over 120 (normal HR is 60-100 beats per minute)  1) What is your heart rate? 49  2) Do you have a log of your heart rate readings (document readings)? No  3) Do you have any other symptoms? Jitter feeling

## 2017-12-26 ENCOUNTER — Telehealth: Payer: Self-pay | Admitting: Internal Medicine

## 2017-12-26 NOTE — Telephone Encounter (Signed)
Copied from [place] [id number]. Topic: Quick Communication - See Telephone Encounter >> Dec 26, 2017  4:26 PM Vernona Rieger wrote: CRM for notification. See Telephone encounter for:   12/26/17.  Pt is requesting lab results from December. Please mail it @ [place] [id number]

## 2017-12-26 NOTE — Telephone Encounter (Signed)
Labs have been printed and mailed

## 2018-01-02 ENCOUNTER — Telehealth: Payer: Self-pay | Admitting: Cardiology

## 2018-01-02 NOTE — Telephone Encounter (Signed)
Pt has been checking BP at home over the past few days because when she first checked it she felt her HR was low. She states that at time her machine reads her HR in the 60's and sometimes is just says "low". Her BP readings were 137/64:180/80; and 121/80. She has no c/o of dizziness, chest pain/pressure, or decrease energy.   Pt plans to take her BP cuff with her to [facility] and have it calibrated. Also asked if she could check her BP on the alternate arm so see if it is getting better HR readings. She generally checks it on her left arm. She just wanted to let us know and was instructed to call us if she starts to become symptomatic. She verbalized understanding no additional questions at this time.

## 2018-01-02 NOTE — Telephone Encounter (Signed)
Pt c/o BP issue: STAT if pt c/o blurred vision, one-sided weakness or slurred speech  1. What are your last 5 BP readings? 169/91 hr reads low  2. Are you having any other symptoms (ex. Dizziness, headache, blurred vision, passed out)? no  3. What is your BP issue? High the hr is nor recording anything but low

## 2018-01-16 NOTE — Progress Notes (Signed)
HPI The patient presents for followup of her cardiomyopathy. Her EF improved from a low of 15% to 55%.  However, her EF was slightly lower at 45% in 2016 on follow up echo.  Since I last saw her she is done well.  Her heart rate has been low on her monitor and she did bring this in today and her blood pressure monitor was accurate.  I reviewed these readings and her blood pressures of often been in the 170s over 90s.  Heart rate sometimes records as low but other times is normal.  She denies any cardiovascular symptoms however.  She is not having any presyncope or syncope.  She has not had any palpitations.  He does not feel any chest discomfort.  She has no shortness of breath, PND or orthopnea.  She works actively in the yard.   Allergies  Allergen Reactions  . Amoxicillin Itching    Curam---from [facility]  . Codeine Itching    Current Outpatient Medications  Medication Sig Dispense Refill  . aspirin 81 MG tablet Take 81 mg by mouth daily.      . carvedilol (COREG) 25 MG tablet Take 1 tablet (25 mg total) by mouth 2 (two) times daily. 180 tablet 3  . lisinopril (PRINIVIL,ZESTRIL) 20 MG tablet Take 1 tablet (20 mg total) by mouth 2 (two) times daily. 180 tablet 3  . Multiple Vitamin (MULTIVITAMIN) tablet Take 1 tablet by mouth daily.      . pravastatin (PRAVACHOL) 80 MG tablet Take 1 tablet (80 mg total) by mouth at bedtime. 90 tablet 1   No current facility-administered medications for this visit.     Past Medical History:  Diagnosis Date  . Allergic rhinitis   . Bell's palsy   . Cancer ([place])    [phone]  . Cardiomyopathy, dilated ([place]) 08/26/2011   15% 2011, 55% 2012  . Carotid stenosis 09/12/2012   Bilateral, mild Lifeline screening  . CHF (congestive heart failure) ([place])   . Clotting disorder ([place])   . Colonic polyp   . DVT (deep venous thrombosis) ([place])   . Glucose intolerance (impaired glucose tolerance)   . HLD (hyperlipidemia)   . HTN (hypertension)   . NICM  (nonischemic cardiomyopathy) ([place])    Coronaries; left main essentially was nonexistent or very short with  . PVD (peripheral vascular disease) ([place])     Past Surgical History:  Procedure Laterality Date  . COLONOSCOPY    . hysterectomy - unknown type    . LUMBAR FUSION  1983  . OOPHORECTOMY    . POLYPECTOMY    . TUBAL LIGATION      ROS:  As stated in the HPI and negative for all other systems.  PHYSICAL EXAM BP (!) 184/96   Pulse 68   Ht 5\' 5"  (1.651 m)   Wt 142 lb (64.4 kg)   BMI 23.63 kg/m   GENERAL:  Well appearing NECK:  No jugular venous distention, waveform within normal limits, carotid upstroke brisk and symmetric, no bruits, no thyromegaly LUNGS:  Clear to auscultation bilaterally CHEST:  Unremarkable HEART:  PMI not displaced or sustained,S1 and S2 within normal limits, no S3, no S4, no clicks, no rubs, no murmurs ABD:  Flat, positive bowel sounds normal in frequency in pitch, no bruits, no rebound, no guarding, no midline pulsatile mass, no hepatomegaly, no splenomegaly EXT:  2 plus pulses throughout, no edema, no cyanosis no clubbing  EKG: Sinus rhythm, rate 68, leftward axis, possible left atrial enlargement,  no acute ST-T wave changes.  ASSESSMENT AND PLAN  CARDIOMYOPATHY:    EF is slightly low.  I have titrated her medications.  I will make changes as below for blood pressure control.   HTN:   Blood pressure still elevated.  I am going to increase lisinopril to 20 mg twice a day.  The next step might be Spironolactone.  CAROTID STENOSIS:    This was mild in the past no change in therapy is indicated.  She's had mild stenosis. No follow-up is indicated at this point.

## 2018-01-17 ENCOUNTER — Ambulatory Visit: Payer: Medicare Other | Admitting: Cardiology

## 2018-01-17 ENCOUNTER — Encounter: Payer: Self-pay | Admitting: Cardiology

## 2018-01-17 VITALS — BP 184/96 | HR 68 | Ht 65.0 in | Wt 142.0 lb

## 2018-01-17 DIAGNOSIS — I428 Other cardiomyopathies: Secondary | ICD-10-CM

## 2018-01-17 DIAGNOSIS — I10 Essential (primary) hypertension: Secondary | ICD-10-CM | POA: Diagnosis not present

## 2018-01-17 MED ORDER — LISINOPRIL 20 MG PO TABS: 20.0000 mg | ORAL_TABLET | Freq: Two times a day (BID) | ORAL | 3 refills | Status: DC

## 2018-01-17 NOTE — Patient Instructions (Addendum)
Medication Instructions:  INCREASE: Lisinopril 20 mg twice a day  If you need a refill on your cardiac medications before your next appointment, please call your pharmacy.  Labwork: None Ordered  Testing/Procedures: None Ordered  Follow-Up: Your physician wants you to follow-up in: 6 Weeks with [employer].    Thank you for choosing [facility] at [facility]!!

## 2018-02-05 ENCOUNTER — Other Ambulatory Visit: Payer: Self-pay | Admitting: *Deleted

## 2018-02-06 MED ORDER — PRAVASTATIN SODIUM 80 MG PO TABS: 80.0000 mg | ORAL_TABLET | Freq: Every day | ORAL | 1 refills | Status: DC

## 2018-02-19 DIAGNOSIS — H2511 Age-related nuclear cataract, right eye: Secondary | ICD-10-CM | POA: Diagnosis not present

## 2018-03-01 ENCOUNTER — Ambulatory Visit: Payer: Medicare Other | Admitting: Physician Assistant

## 2018-03-01 DIAGNOSIS — H25811 Combined forms of age-related cataract, right eye: Secondary | ICD-10-CM | POA: Diagnosis not present

## 2018-03-01 DIAGNOSIS — H2511 Age-related nuclear cataract, right eye: Secondary | ICD-10-CM | POA: Diagnosis not present

## 2018-03-14 ENCOUNTER — Encounter: Payer: Self-pay | Admitting: Internal Medicine

## 2018-03-14 DIAGNOSIS — C4441 Basal cell carcinoma of skin of scalp and neck: Secondary | ICD-10-CM | POA: Diagnosis not present

## 2018-03-14 DIAGNOSIS — L821 Other seborrheic keratosis: Secondary | ICD-10-CM | POA: Diagnosis not present

## 2018-03-14 DIAGNOSIS — D485 Neoplasm of uncertain behavior of skin: Secondary | ICD-10-CM | POA: Diagnosis not present

## 2018-03-14 DIAGNOSIS — L570 Actinic keratosis: Secondary | ICD-10-CM | POA: Diagnosis not present

## 2018-03-14 DIAGNOSIS — Z85828 Personal history of other malignant neoplasm of skin: Secondary | ICD-10-CM | POA: Diagnosis not present

## 2018-03-22 DIAGNOSIS — H25812 Combined forms of age-related cataract, left eye: Secondary | ICD-10-CM | POA: Diagnosis not present

## 2018-03-22 DIAGNOSIS — H2512 Age-related nuclear cataract, left eye: Secondary | ICD-10-CM | POA: Diagnosis not present

## 2018-04-12 ENCOUNTER — Telehealth: Payer: Self-pay | Admitting: Internal Medicine

## 2018-04-12 NOTE — Telephone Encounter (Signed)
Rec'd from [place] forwarded 2 pages to Dr.John [facility]

## 2018-05-15 DIAGNOSIS — L905 Scar conditions and fibrosis of skin: Secondary | ICD-10-CM | POA: Diagnosis not present

## 2018-05-15 DIAGNOSIS — C4441 Basal cell carcinoma of skin of scalp and neck: Secondary | ICD-10-CM | POA: Diagnosis not present

## 2018-06-18 ENCOUNTER — Encounter: Payer: Self-pay | Admitting: Internal Medicine

## 2018-06-18 ENCOUNTER — Other Ambulatory Visit (INDEPENDENT_AMBULATORY_CARE_PROVIDER_SITE_OTHER): Payer: Medicare Other

## 2018-06-18 ENCOUNTER — Ambulatory Visit: Payer: Medicare Other | Admitting: Internal Medicine

## 2018-06-18 VITALS — BP 124/78 | HR 60 | Temp 98.5°F | Ht 65.0 in | Wt 143.0 lb

## 2018-06-18 DIAGNOSIS — R300 Dysuria: Secondary | ICD-10-CM

## 2018-06-18 DIAGNOSIS — R7302 Impaired glucose tolerance (oral): Secondary | ICD-10-CM

## 2018-06-18 DIAGNOSIS — N390 Urinary tract infection, site not specified: Secondary | ICD-10-CM

## 2018-06-18 DIAGNOSIS — I10 Essential (primary) hypertension: Secondary | ICD-10-CM | POA: Diagnosis not present

## 2018-06-18 LAB — URINALYSIS, ROUTINE W REFLEX MICROSCOPIC
BILIRUBIN URINE: NEGATIVE
Ketones, ur: NEGATIVE
Nitrite: NEGATIVE
Specific Gravity, Urine: 1.02 (ref 1.000–1.030)
Total Protein, Urine: 100 — AB
Urine Glucose: NEGATIVE
Urobilinogen, UA: 0.2 (ref 0.0–1.0)
pH: 6.5 (ref 5.0–8.0)

## 2018-06-18 MED ORDER — CIPROFLOXACIN HCL 500 MG PO TABS: 500.0000 mg | ORAL_TABLET | Freq: Two times a day (BID) | ORAL | 0 refills | Status: AC

## 2018-06-18 MED ORDER — CEPHALEXIN 500 MG PO CAPS: 500.0000 mg | ORAL_CAPSULE | Freq: Three times a day (TID) | ORAL | 0 refills | Status: DC

## 2018-06-18 MED ORDER — CEFTRIAXONE SODIUM 1 G IJ SOLR: 1.0000 g | Freq: Once | INTRAMUSCULAR | Status: DC

## 2018-06-18 NOTE — Patient Instructions (Addendum)
Please take all new medication as prescribed - the antibiotic  Please continue all other medications as before, and refills have been done if requested.  Please have the pharmacy call with any other refills you may need.  Please continue your efforts at being more active, low cholesterol diet, and weight control.  Please keep your appointments with your specialists as you may have planned  Please go to the LAB in the Basement (turn left off the elevator) for the tests to be done today  You will be contacted by phone if any changes need to be made immediately.  Otherwise, you will receive a letter about your results with an explanation, but please check with MyChart first.  Please remember to sign up for MyChart if you have not done so, as this will be important to you in the future with finding out test results, communicating by private email, and scheduling acute appointments online when needed.  Please return in 3 months, or sooner if needed, with Lab testing done 3-5 days before

## 2018-06-18 NOTE — Assessment & Plan Note (Signed)
Likely uti, for urine studies and empiric cipro, with f/u cx as able;  If cx neg, we should consider referral urology to hematuria evaluation

## 2018-06-18 NOTE — Assessment & Plan Note (Signed)
stable overall by history and exam, recent data reviewed with pt, and pt to continue medical treatment as before,  to f/u any worsening symptoms or concerns BP Readings from Last 3 Encounters:  06/18/18 124/78  01/17/18 (!) 184/96  12/04/17 128/72

## 2018-06-18 NOTE — Assessment & Plan Note (Signed)
stable overall by history and exam, recent data reviewed with pt, and pt to continue medical treatment as before,  to f/u any worsening symptoms or concerns Lab Results  Component Value Date   HGBA1C 6.4 12/04/2017

## 2018-06-18 NOTE — Progress Notes (Signed)
Subjective:    Patient ID: Addison Naegeli Suits, female    DOB: 1936/01/17, 82 y.o.   MRN: [id number]  HPI  Here with c/o GU symptoms for total 2 wks in the fashion - Now ytaking azo for 3 days onset dysuria and lower abd discomfort, had a previous symptoms with urinary frequency june 18 but seemed to resolved though had gross hematuria with that episode, none this time.  No prior hx of renal stone, but had cyst on MRI recently she wonders if related to the blood.  Last UTI  - never per pt.  Had some mild symptoms occasionally but resolved quickly and did not require tx.  Denies urinary symptoms such as flank pain, or n/v, fever, chills.  Pt denies chest pain, increased sob or doe, wheezing, orthopnea, PND, increased LE swelling, palpitations, dizziness or syncope.  Pt denies polydipsia, polyuria Past Medical History:  Diagnosis Date  . Allergic rhinitis   . Bell's palsy   . Cancer ([place])    [phone]  . Cardiomyopathy, dilated ([place]) 08/26/2011   15% 2011, 55% 2012  . Carotid stenosis 09/12/2012   Bilateral, mild Lifeline screening  . CHF (congestive heart failure) ([place])   . Clotting disorder ([place])   . Colonic polyp   . DVT (deep venous thrombosis) ([place])   . Glucose intolerance (impaired glucose tolerance)   . HLD (hyperlipidemia)   . HTN (hypertension)   . NICM (nonischemic cardiomyopathy) ([place])    Coronaries; left main essentially was nonexistent or very short with  . PVD (peripheral vascular disease) ([place])    Past Surgical History:  Procedure Laterality Date  . COLONOSCOPY    . hysterectomy - unknown type    . LUMBAR FUSION  1983  . OOPHORECTOMY    . POLYPECTOMY    . TUBAL LIGATION      reports that she quit smoking about 37 years ago. She has never used smokeless tobacco. She reports that she does not drink alcohol. Her drug history is not on file. family history includes COPD in her mother and unknown relative; Colon polyps in her brother and sister; Coronary artery disease  in her unknown relative; Hyperlipidemia in her unknown relative; Leukemia in her sister, sister, and unknown relative; Liver cancer in her unknown relative; Lung cancer in her unknown relative; Melanoma in her unknown relative; Osteoporosis in her mother and unknown relative. Allergies  Allergen Reactions  . Amoxicillin Itching    Curam---from [employer]  . Codeine Itching   Current Outpatient Medications on File Prior to Visit  Medication Sig Dispense Refill  . aspirin 81 MG tablet Take 81 mg by mouth daily.      . carvedilol (COREG) 25 MG tablet Take 1 tablet (25 mg total) by mouth 2 (two) times daily. 180 tablet 3  . lisinopril (PRINIVIL,ZESTRIL) 20 MG tablet Take 1 tablet (20 mg total) by mouth 2 (two) times daily. 180 tablet 3  . Multiple Vitamin (MULTIVITAMIN) tablet Take 1 tablet by mouth daily.      . pravastatin (PRAVACHOL) 80 MG tablet Take 1 tablet (80 mg total) by mouth at bedtime. 90 tablet 1   No current facility-administered medications on file prior to visit.    Review of Systems  Constitutional: Negative for other unusual diaphoresis or sweats HENT: Negative for ear discharge or swelling Eyes: Negative for other worsening visual disturbances Respiratory: Negative for stridor or other swelling  Gastrointestinal: Negative for worsening distension or other blood Genitourinary: Negative for retention or other urinary  change Musculoskeletal: Negative for other MSK pain or swelling Skin: Negative for color change or other new lesions Neurological: Negative for worsening tremors and other numbness  Psychiatric/Behavioral: Negative for worsening agitation or other fatigue All other system neg per pt    Objective:   Physical Exam BP 124/78   Pulse 60   Temp 98.5 F (36.9 C) (Oral)   Ht 5\' 5"  (1.651 m)   Wt 143 lb (64.9 kg)   SpO2 94%   BMI 23.80 kg/m  VS noted,  Constitutional: Pt appears in NAD HENT: Head: NCAT.  Right Ear: External ear normal.  Left Ear: External  ear normal.  Eyes: . Pupils are equal, round, and reactive to light. Conjunctivae and EOM are normal Nose: without d/c or deformity Neck: Neck supple. Gross normal ROM Cardiovascular: Normal rate and regular rhythm.   Pulmonary/Chest: Effort normal and breath sounds without rales or wheezing.  Abd:  Soft, ND, + BS, no organomegaly but + tender low mid abdomen, no flank tender Neurological: Pt is alert. At baseline orientation, motor grossly intact Skin: Skin is warm. No rashes, other new lesions, no LE edema Psychiatric: Pt behavior is normal without agitation  No other exam findings    Assessment & Plan:

## 2018-06-21 LAB — URINE CULTURE
MICRO NUMBER:: 90781395
SPECIMEN QUALITY: ADEQUATE

## 2018-07-09 ENCOUNTER — Other Ambulatory Visit: Payer: Self-pay | Admitting: Internal Medicine

## 2018-07-09 DIAGNOSIS — Z1231 Encounter for screening mammogram for malignant neoplasm of breast: Secondary | ICD-10-CM

## 2018-08-08 ENCOUNTER — Encounter: Payer: Self-pay | Admitting: Internal Medicine

## 2018-08-08 ENCOUNTER — Ambulatory Visit: Payer: Medicare Other | Admitting: Internal Medicine

## 2018-08-08 VITALS — BP 128/86 | HR 83 | Temp 97.8°F | Ht 65.0 in | Wt 139.0 lb

## 2018-08-08 DIAGNOSIS — J309 Allergic rhinitis, unspecified: Secondary | ICD-10-CM | POA: Diagnosis not present

## 2018-08-08 DIAGNOSIS — I10 Essential (primary) hypertension: Secondary | ICD-10-CM | POA: Diagnosis not present

## 2018-08-08 DIAGNOSIS — R7302 Impaired glucose tolerance (oral): Secondary | ICD-10-CM | POA: Diagnosis not present

## 2018-08-08 NOTE — Assessment & Plan Note (Signed)
[place] for [employer] claritin or zyrtec, exam o/w benign today, and no indication for antibiotics as she was hoping

## 2018-08-08 NOTE — Assessment & Plan Note (Signed)
stable overall by history and exam, recent data reviewed with pt, and pt to continue medical treatment as before,  to f/u any worsening symptoms or concerns BP Readings from Last 3 Encounters:  08/08/18 128/86  06/18/18 124/78  01/17/18 (!) 184/96

## 2018-08-08 NOTE — Assessment & Plan Note (Signed)
stable overall by history and exam, recent data reviewed with pt, and pt to continue medical treatment as before,  to f/u any worsening symptoms or concerns  

## 2018-08-08 NOTE — Patient Instructions (Signed)
OK to try the OTC claritin or zyrtec  Please call if you have throat pain, swelling or fever  You can also take Delsym OTC for cough, and/or Mucinex (or it's generic off brand) for congestion, and tylenol as needed for pain.  Please continue all other medications as before, and refills have been done if requested.  Please have the pharmacy call with any other refills you may need.  Please keep your appointments with your specialists as you may have planned

## 2018-08-08 NOTE — Progress Notes (Signed)
Subjective:    Patient ID: Carol Lawrence, female    DOB: 10-18-1936, 82 y.o.   MRN: [id number]  HPI  Here to f/u with concern about "3 white spots" to post pharynx for several days, and may have gotten smaller today."  Denies acute onset fever, facial pain, pressure, headache, general weakness and malaise, greenish d/c, or ST and cough, and pt denies chest pain, wheezing, increased sob or doe, orthopnea, PND, increased LE swelling, palpitations, dizziness or syncope. Does not feel ill.  Does have several wks ongoing nasal allergy symptoms with clearish congestion, itch and sneezing, without fever, pain, ST, cough, swelling or wheezing, but is not clear this is related.   BP Readings from Last 3 Encounters:  08/08/18 128/86  06/18/18 124/78  01/17/18 (!) 184/96   Wt Readings from Last 3 Encounters:  08/08/18 139 lb (63 kg)  06/18/18 143 lb (64.9 kg)  01/17/18 142 lb (64.4 kg)   Past Medical History:  Diagnosis Date  . Allergic rhinitis   . Bell's palsy   . Cancer ([place])    [phone]  . Cardiomyopathy, dilated ([place]) 08/26/2011   15% 2011, 55% 2012  . Carotid stenosis 09/12/2012   Bilateral, mild Lifeline screening  . CHF (congestive heart failure) ([place])   . Clotting disorder ([place])   . Colonic polyp   . DVT (deep venous thrombosis) ([place])   . Glucose intolerance (impaired glucose tolerance)   . HLD (hyperlipidemia)   . HTN (hypertension)   . NICM (nonischemic cardiomyopathy) ([place])    Coronaries; left main essentially was nonexistent or very short with  . PVD (peripheral vascular disease) ([place])    Past Surgical History:  Procedure Laterality Date  . COLONOSCOPY    . hysterectomy - unknown type    . LUMBAR FUSION  1983  . OOPHORECTOMY    . POLYPECTOMY    . TUBAL LIGATION      reports that she quit smoking about 37 years ago. She has never used smokeless tobacco. She reports that she does not drink alcohol. Her drug history is not on file. family history includes COPD in  her mother and unknown relative; Colon polyps in her brother and sister; Coronary artery disease in her unknown relative; Hyperlipidemia in her unknown relative; Leukemia in her sister, sister, and unknown relative; Liver cancer in her unknown relative; Lung cancer in her unknown relative; Melanoma in her unknown relative; Osteoporosis in her mother and unknown relative. Allergies  Allergen Reactions  . Amoxicillin Itching    Curam---from [employer]  . Codeine Itching   Current Outpatient Medications on File Prior to Visit  Medication Sig Dispense Refill  . aspirin 81 MG tablet Take 81 mg by mouth daily.      . carvedilol (COREG) 25 MG tablet Take 1 tablet (25 mg total) by mouth 2 (two) times daily. 180 tablet 3  . lisinopril (PRINIVIL,ZESTRIL) 20 MG tablet Take 1 tablet (20 mg total) by mouth 2 (two) times daily. 180 tablet 3  . Multiple Vitamin (MULTIVITAMIN) tablet Take 1 tablet by mouth daily.      . pravastatin (PRAVACHOL) 80 MG tablet Take 1 tablet (80 mg total) by mouth at bedtime. 90 tablet 1   No current facility-administered medications on file prior to visit.    Review of Systems  Constitutional: Negative for other unusual diaphoresis or sweats HENT: Negative for ear discharge or swelling Eyes: Negative for other worsening visual disturbances Respiratory: Negative for stridor or other swelling  Gastrointestinal: Negative  for worsening distension or other blood Genitourinary: Negative for retention or other urinary change Musculoskeletal: Negative for other MSK pain or swelling Skin: Negative for color change or other new lesions Neurological: Negative for worsening tremors and other numbness  Psychiatric/Behavioral: Negative for worsening agitation or other fatigue All other system neg per pt    Objective:   Physical Exam BP 128/86   Pulse 83   Temp 97.8 F (36.6 C) (Oral)   Ht 5\' 5"  (1.651 m)   Wt 139 lb (63 kg)   SpO2 96%   BMI 23.13 kg/m  VS noted,    Constitutional: Pt appears in NAD HENT: Head: NCAT.  Right Ear: External ear normal.  Left Ear: External ear normal.  Bilat tm's with trace erythema.  Max sinus areas non tender.  Pharynx with mild erythema, no exudate or "spots" or other lesions, no swelling, no submandib LA Eyes: . Pupils are equal, round, and reactive to light. Conjunctivae and EOM are normal Nose: without d/c or deformity Neck: Neck supple. Gross normal ROM Cardiovascular: Normal rate and regular rhythm.   Pulmonary/Chest: Effort normal and breath sounds without rales or wheezing.  Neurological: Pt is alert. At baseline orientation, motor grossly intact Skin: Skin is warm. No rashes, other new lesions, no LE edema Psychiatric: Pt behavior is normal without agitation , mild nervous No other exam findings  Lab Results  Component Value Date   WBC 4.8 12/04/2017   HGB 16.2 (H) 12/04/2017   HCT 48.2 (H) 12/04/2017   PLT 126.0 (L) 12/04/2017   GLUCOSE 94 12/04/2017   CHOL 116 12/04/2017   TRIG 134.0 12/04/2017   HDL 39.10 12/04/2017   LDLDIRECT 98.6 06/09/2010   LDLCALC 50 12/04/2017   ALT 18 12/04/2017   AST 17 12/04/2017   NA 142 12/04/2017   K 3.8 12/04/2017   CL 106 12/04/2017   CREATININE 0.92 12/04/2017   BUN 18 12/04/2017   CO2 30 12/04/2017   TSH 4.14 12/04/2017   INR 1.0 ratio 03/25/2010   HGBA1C 6.4 12/04/2017       Assessment & Plan:

## 2018-08-09 ENCOUNTER — Other Ambulatory Visit: Payer: Self-pay | Admitting: Physician Assistant

## 2018-08-21 ENCOUNTER — Ambulatory Visit
Admission: RE | Admit: 2018-08-21 | Discharge: 2018-08-21 | Disposition: A | Discharge status: Home / Self Care | Payer: Medicare Other | Source: Ambulatory Visit | Attending: Internal Medicine | Admitting: Internal Medicine

## 2018-08-21 DIAGNOSIS — Z1231 Encounter for screening mammogram for malignant neoplasm of breast: Secondary | ICD-10-CM | POA: Diagnosis not present

## 2018-09-01 ENCOUNTER — Other Ambulatory Visit: Payer: Self-pay | Admitting: Cardiology

## 2018-09-03 DIAGNOSIS — Z779 Other contact with and (suspected) exposures hazardous to health: Secondary | ICD-10-CM | POA: Diagnosis not present

## 2018-09-03 DIAGNOSIS — Z6824 Body mass index (BMI) 24.0-24.9, adult: Secondary | ICD-10-CM | POA: Diagnosis not present

## 2018-09-24 ENCOUNTER — Encounter: Payer: Self-pay | Admitting: Family

## 2018-09-24 ENCOUNTER — Ambulatory Visit: Payer: Medicare Other | Admitting: Family

## 2018-09-24 VITALS — BP 116/70 | HR 71 | Temp 98.3°F | Ht 65.0 in | Wt 140.0 lb

## 2018-09-24 DIAGNOSIS — J209 Acute bronchitis, unspecified: Secondary | ICD-10-CM | POA: Diagnosis not present

## 2018-09-24 MED ORDER — DOXYCYCLINE HYCLATE 100 MG PO TABS: 100.0000 mg | ORAL_TABLET | Freq: Two times a day (BID) | ORAL | 0 refills | Status: DC

## 2018-09-24 NOTE — Progress Notes (Signed)
Carol Lawrence is a 82 y.o. female with the following history as recorded in EpicCare:  Patient Active Problem List   Diagnosis Date Noted  . Dysuria 06/18/2018  . Hematochezia 10/03/2017  . Abdominal pain 10/03/2017  . Osteopenia 11/22/2016  . Polycythemia 01/06/2016  . Acute pharyngitis 11/18/2014  . Polycythemia, secondary 10/08/2014  . Cough 01/14/2014  . Toenail avulsion 09/17/2013  . Carotid stenosis 09/12/2012  . Impaired glucose tolerance 08/26/2011  . Preventative health care 08/26/2011  . CARDIOMYOPATHY 10/26/2010  . Palpitations 08/20/2010  . UNSPECIFIED HEART FAILURE 03/11/2010  . Dyspnea 02/26/2010  . PERIPHERAL VASCULAR DISEASE 01/16/2009  . LEIOMYOMA, UTERUS 07/12/2007  . HYPERLIPIDEMIA 07/12/2007  . Essential hypertension 07/12/2007  . DVT 07/12/2007  . Allergic rhinitis 07/12/2007  . BELL'S PALSY, HX OF 07/12/2007  . History of colonic polyps 07/12/2007    Current Outpatient Medications  Medication Sig Dispense Refill  . aspirin 81 MG tablet Take 81 mg by mouth daily.      . carvedilol (COREG) 25 MG tablet TAKE 1 TABLET BY MOUTH TWICE A DAY 180 tablet 3  . doxycycline (VIBRA-TABS) 100 MG tablet Take 1 tablet (100 mg total) by mouth 2 (two) times daily. 20 tablet 0  . lisinopril (PRINIVIL,ZESTRIL) 20 MG tablet Take 1 tablet (20 mg total) by mouth 2 (two) times daily. 180 tablet 3  . Multiple Vitamin (MULTIVITAMIN) tablet Take 1 tablet by mouth daily.      . pravastatin (PRAVACHOL) 80 MG tablet Take 1 tablet (80 mg total) by mouth at bedtime. Please schedule appointment for further refills 90 tablet 0   No current facility-administered medications for this visit.     Allergies: Amoxicillin and Codeine  Past Medical History:  Diagnosis Date  . Allergic rhinitis   . Bell's palsy   . Cancer ([place])    [phone]  . Cardiomyopathy, dilated ([place]) 08/26/2011   15% 2011, 55% 2012  . Carotid stenosis 09/12/2012   Bilateral, mild Lifeline screening  . CHF  (congestive heart failure) ([place])   . Clotting disorder ([place])   . Colonic polyp   . DVT (deep venous thrombosis) ([place])   . Glucose intolerance (impaired glucose tolerance)   . HLD (hyperlipidemia)   . HTN (hypertension)   . NICM (nonischemic cardiomyopathy) ([place])    Coronaries; left main essentially was nonexistent or very short with  . PVD (peripheral vascular disease) ([place])     Past Surgical History:  Procedure Laterality Date  . COLONOSCOPY    . hysterectomy - unknown type    . LUMBAR FUSION  1983  . OOPHORECTOMY    . POLYPECTOMY    . TUBAL LIGATION      Family History  Problem Relation Age of Onset  . COPD Mother   . Osteoporosis Mother   . Leukemia Sister   . Colon polyps Sister   . Colon polyps Brother   . Leukemia Sister   . Leukemia Unknown   . Coronary artery disease Unknown   . Hyperlipidemia Unknown   . Lung cancer Unknown   . Osteoporosis Unknown   . COPD Unknown   . Melanoma Unknown   . Liver cancer Unknown   . Breast cancer Daughter 41    Social History   Tobacco Use  . Smoking status: Former Smoker    Last attempt to quit: 12/19/1980    Years since quitting: 37.7  . Smokeless tobacco: Never Used  Substance Use Topics  . Alcohol use: No    Subjective:  Patient presents with cough/ congestion x 1 week; + fever- up to 101 last night; + chest tightness; +productive cough; not prone to bronchitis or pneumonia;    Objective:  Vitals:   09/24/18 1424  BP: 116/70  Pulse: 71  Temp: 98.3 F (36.8 C)  TempSrc: Oral  SpO2: 97%  Weight: 140 lb 0.6 oz (63.5 kg)  Height: 5\' 5"  (1.651 m)    General: Well developed, well nourished, in no acute distress  Skin : Warm and dry.  Head: Normocephalic and atraumatic  Eyes: Sclera and conjunctiva clear; pupils round and reactive to light; extraocular movements intact  Ears: External normal; canals clear; tympanic membranes normal  Oropharynx: Pink, supple. No suspicious lesions  Neck: Supple without  thyromegaly, adenopathy  Lungs: Respirations unlabored; wheezing noted in upper lobes CVS exam: normal rate and regular rhythm.  Neurologic: Alert and oriented; speech intact; face symmetrical; moves all extremities well; CNII-XII intact without focal deficit  Assessment:   1. Acute bronchitis, unspecified organism     Plan:  Rx for Doxycycline 100 mg bid x 10 days, sample of BREO 100 mg qd x 10 days; increase fluids, rest and follow up worse, no better.   No follow-ups on file.  No orders of the defined types were placed in this encounter.   Requested Prescriptions   Signed Prescriptions Disp Refills  . doxycycline (VIBRA-TABS) 100 MG tablet 20 tablet 0    Sig: Take 1 tablet (100 mg total) by mouth 2 (two) times daily.

## 2018-09-25 ENCOUNTER — Ambulatory Visit: Payer: Medicare Other

## 2018-11-28 ENCOUNTER — Other Ambulatory Visit: Payer: Self-pay | Admitting: Cardiology

## 2018-12-05 ENCOUNTER — Other Ambulatory Visit (INDEPENDENT_AMBULATORY_CARE_PROVIDER_SITE_OTHER): Payer: Medicare Other

## 2018-12-05 ENCOUNTER — Encounter: Payer: Self-pay | Admitting: Internal Medicine

## 2018-12-05 ENCOUNTER — Ambulatory Visit (INDEPENDENT_AMBULATORY_CARE_PROVIDER_SITE_OTHER): Payer: Medicare Other | Admitting: Internal Medicine

## 2018-12-05 VITALS — BP 116/78 | HR 78 | Temp 98.3°F | Ht 65.0 in | Wt 144.0 lb

## 2018-12-05 DIAGNOSIS — R7302 Impaired glucose tolerance (oral): Secondary | ICD-10-CM

## 2018-12-05 DIAGNOSIS — Z0000 Encounter for general adult medical examination without abnormal findings: Secondary | ICD-10-CM

## 2018-12-05 LAB — BASIC METABOLIC PANEL
BUN: 19 mg/dL (ref 6–23)
CO2: 29 mEq/L (ref 19–32)
Calcium: 9.6 mg/dL (ref 8.4–10.5)
Chloride: 106 mEq/L (ref 96–112)
Creatinine, Ser: 1.07 mg/dL (ref 0.40–1.20)
GFR: 52.42 mL/min — ABNORMAL LOW (ref >60.00)
Glucose, Bld: 117 mg/dL — ABNORMAL HIGH (ref 70–99)
Potassium: 4 mEq/L (ref 3.5–5.1)
Sodium: 143 mEq/L (ref 135–145)

## 2018-12-05 LAB — HEPATIC FUNCTION PANEL
ALT: 13 U/L (ref 0–35)
AST: 14 U/L (ref 0–37)
Albumin: 4.3 g/dL (ref 3.5–5.2)
Alkaline Phosphatase: 65 U/L (ref 39–117)
Bilirubin, Direct: 0.1 mg/dL (ref 0.0–0.3)
Total Bilirubin: 0.6 mg/dL (ref 0.2–1.2)
Total Protein: 7 g/dL (ref 6.0–8.3)

## 2018-12-05 LAB — CBC WITH DIFFERENTIAL/PLATELET
Basophils Absolute: 0 10*3/uL (ref 0.0–0.1)
Basophils Relative: 0.5 % (ref 0.0–3.0)
Eosinophils Absolute: 0.1 10*3/uL (ref 0.0–0.7)
Eosinophils Relative: 2.2 % (ref 0.0–5.0)
HCT: 48.3 % — ABNORMAL HIGH (ref 36.0–46.0)
Hemoglobin: 16.1 g/dL — ABNORMAL HIGH (ref 12.0–15.0)
LYMPHS ABS: 1.2 10*3/uL (ref 0.7–4.0)
Lymphocytes Relative: 22.4 % (ref 12.0–46.0)
MCHC: 33.3 g/dL (ref 30.0–36.0)
MCV: 89.4 fl (ref 78.0–100.0)
MONO ABS: 0.5 10*3/uL (ref 0.1–1.0)
Monocytes Relative: 9.3 % (ref 3.0–12.0)
Neutro Abs: 3.6 10*3/uL (ref 1.4–7.7)
Neutrophils Relative %: 65.6 % (ref 43.0–77.0)
Platelets: 131 10*3/uL — ABNORMAL LOW (ref 150.0–400.0)
RBC: 5.4 Mil/uL — ABNORMAL HIGH (ref 3.87–5.11)
RDW: 14.3 % (ref 11.5–15.5)
WBC: 5.4 10*3/uL (ref 4.0–10.5)

## 2018-12-05 LAB — LIPID PANEL
Cholesterol: 131 mg/dL (ref 0–200)
HDL: 36.6 mg/dL — ABNORMAL LOW (ref >39.00)
LDL Cholesterol: 60 mg/dL (ref 0–99)
NonHDL: 94.13
Total CHOL/HDL Ratio: 4
Triglycerides: 173 mg/dL — ABNORMAL HIGH (ref 0.0–149.0)
VLDL: 34.6 mg/dL (ref 0.0–40.0)

## 2018-12-05 LAB — URINALYSIS, ROUTINE W REFLEX MICROSCOPIC
Bilirubin Urine: NEGATIVE
KETONES UR: NEGATIVE
LEUKOCYTES UA: NEGATIVE
Nitrite: NEGATIVE
Specific Gravity, Urine: 1.02 (ref 1.000–1.030)
Urine Glucose: NEGATIVE
Urobilinogen, UA: 0.2 (ref 0.0–1.0)
pH: 6 (ref 5.0–8.0)

## 2018-12-05 LAB — HEMOGLOBIN A1C: Hgb A1c MFr Bld: 6.4 % (ref 4.6–6.5)

## 2018-12-05 LAB — TSH: TSH: 3.44 u[IU]/mL (ref 0.35–4.50)

## 2018-12-05 NOTE — Patient Instructions (Signed)

## 2018-12-05 NOTE — Assessment & Plan Note (Signed)
stable overall by history and exam, recent data reviewed with pt, and pt to continue medical treatment as before,  to f/u any worsening symptoms or concerns  

## 2018-12-05 NOTE — Progress Notes (Signed)
Subjective:    Patient ID: Carol Lawrence, female    DOB: 28-Oct-1936, 82 y.o.   MRN: [id number]  HPI  Here for wellness and f/u;  Overall doing ok;  Pt denies Chest pain, worsening SOB, DOE, wheezing, orthopnea, PND, worsening LE edema, palpitations, dizziness or syncope.  Pt denies neurological change such as new headache, facial or extremity weakness.  Pt denies polydipsia, polyuria, or low sugar symptoms. Pt states overall good compliance with treatment and medications, good tolerability, and has been trying to follow appropriate diet.  Pt denies worsening depressive symptoms, suicidal ideation or panic. No fever, night sweats, wt loss, loss of appetite, or other constitutional symptoms.  Pt states good ability with ADL's, has low fall risk, home safety reviewed and adequate, no other significant changes in hearing or vision, and only occasionally active with exercise.  No new complaints Past Medical History:  Diagnosis Date  . Allergic rhinitis   . Bell's palsy   . Cancer ([place])    [phone]  . Cardiomyopathy, dilated ([place]) 08/26/2011   15% 2011, 55% 2012  . Carotid stenosis 09/12/2012   Bilateral, mild Lifeline screening  . CHF (congestive heart failure) ([place])   . Clotting disorder ([place])   . Colonic polyp   . DVT (deep venous thrombosis) ([place])   . Glucose intolerance (impaired glucose tolerance)   . HLD (hyperlipidemia)   . HTN (hypertension)   . NICM (nonischemic cardiomyopathy) ([place])    Coronaries; left main essentially was nonexistent or very short with  . PVD (peripheral vascular disease) ([place])    Past Surgical History:  Procedure Laterality Date  . COLONOSCOPY    . hysterectomy - unknown type    . LUMBAR FUSION  1983  . OOPHORECTOMY    . POLYPECTOMY    . TUBAL LIGATION      reports that she quit smoking about 37 years ago. She has never used smokeless tobacco. She reports that she does not drink alcohol. No history on file for drug. family history includes Breast  cancer (age of onset: 5) in her daughter; COPD in her mother and unknown relative; Colon polyps in her brother and sister; Coronary artery disease in her unknown relative; Hyperlipidemia in her unknown relative; Leukemia in her sister, sister, and unknown relative; Liver cancer in her unknown relative; Lung cancer in her unknown relative; Melanoma in her unknown relative; Osteoporosis in her mother and unknown relative. Allergies  Allergen Reactions  . Amoxicillin Itching    Curam---from [place]  . Codeine Itching   Current Outpatient Medications on File Prior to Visit  Medication Sig Dispense Refill  . aspirin 81 MG tablet Take 81 mg by mouth daily.      . carvedilol (COREG) 25 MG tablet TAKE 1 TABLET BY MOUTH TWICE A DAY 180 tablet 3  . lisinopril (PRINIVIL,ZESTRIL) 20 MG tablet Take 1 tablet (20 mg total) by mouth 2 (two) times daily. 180 tablet 3  . pravastatin (PRAVACHOL) 80 MG tablet Take 1 tablet (80 mg total) by mouth at bedtime. 90 tablet 0   No current facility-administered medications on file prior to visit.    Review of Systems Constitutional: Negative for other unusual diaphoresis, sweats, appetite or weight changes HENT: Negative for other worsening hearing loss, ear pain, facial swelling, mouth sores or neck stiffness.   Eyes: Negative for other worsening pain, redness or other visual disturbance.  Respiratory: Negative for other stridor or swelling Cardiovascular: Negative for other palpitations or other chest pain  Gastrointestinal:  Negative for worsening diarrhea or loose stools, blood in stool, distention or other pain Genitourinary: Negative for hematuria, flank pain or other change in urine volume.  Musculoskeletal: Negative for myalgias or other joint swelling.  Skin: Negative for other color change, or other wound or worsening drainage.  Neurological: Negative for other syncope or numbness. Hematological: Negative for other adenopathy or  swelling Psychiatric/Behavioral: Negative for hallucinations, other worsening agitation, SI, self-injury, or new decreased concentration All other system neg per pt    Objective:   Physical Exam BP 116/78   Pulse 78   Temp 98.3 F (36.8 C) (Oral)   Ht 5\' 5"  (1.651 m)   Wt 144 lb (65.3 kg)   SpO2 96%   BMI 23.96 kg/m  VS noted,  Constitutional: Pt is oriented to person, place, and time. Appears well-developed and well-nourished, in no significant distress and comfortable Head: Normocephalic and atraumatic  Eyes: Conjunctivae and EOM are normal. Pupils are equal, round, and reactive to light Right Ear: External ear normal without discharge Left Ear: External ear normal without discharge Nose: Nose without discharge or deformity Mouth/Throat: Oropharynx is without other ulcerations and moist  Neck: Normal range of motion. Neck supple. No JVD present. No tracheal deviation present or significant neck LA or mass Cardiovascular: Normal rate, regular rhythm, normal heart sounds and intact distal pulses.   Pulmonary/Chest: WOB normal and breath sounds without rales or wheezing  Abdominal: Soft. Bowel sounds are normal. NT. No HSM  Musculoskeletal: Normal range of motion. Exhibits no edema Lymphadenopathy: Has no other cervical adenopathy.  Neurological: Pt is alert and oriented to person, place, and time. Pt has normal reflexes. No cranial nerve deficit. Motor grossly intact, Gait intact Skin: Skin is warm and dry. No rash noted or new ulcerations Psychiatric:  Has normal mood and affect. Behavior is normal without agitation No other exam findings Lab Results  Component Value Date   WBC 5.4 12/05/2018   HGB 16.1 (H) 12/05/2018   HCT 48.3 (H) 12/05/2018   PLT 131.0 (L) 12/05/2018   GLUCOSE 117 (H) 12/05/2018   CHOL 131 12/05/2018   TRIG 173.0 (H) 12/05/2018   HDL 36.60 (L) 12/05/2018   LDLDIRECT 98.6 06/09/2010   LDLCALC 60 12/05/2018   ALT 13 12/05/2018   AST 14 12/05/2018   NA  143 12/05/2018   K 4.0 12/05/2018   CL 106 12/05/2018   CREATININE 1.07 12/05/2018   BUN 19 12/05/2018   CO2 29 12/05/2018   TSH 3.44 12/05/2018   INR 1.0 ratio 03/25/2010   HGBA1C 6.4 12/05/2018          Assessment & Plan:

## 2018-12-05 NOTE — Assessment & Plan Note (Signed)

## 2018-12-11 ENCOUNTER — Encounter: Payer: Medicare Other | Admitting: Internal Medicine

## 2018-12-27 ENCOUNTER — Ambulatory Visit: Payer: Medicare Other | Admitting: Cardiology

## 2018-12-29 NOTE — Progress Notes (Signed)
HPI The patient presents for followup of her cardiomyopathy. Her EF improved from a low of 15% to 55%.  However, her EF was slightly lower at 45% in 2016 on follow up echo.  Since I last saw her she has done very well.  She remains active in her yard and doing her housework.  She denies any cardiovascular symptoms.  She can push a lawnmower. The patient denies any new symptoms such as chest discomfort, neck or arm discomfort. There has been no new shortness of breath, PND or orthopnea. There have been no reported palpitations, presyncope or syncope.   Allergies  Allergen Reactions  . Amoxicillin Itching    Curam---from [place]  . Codeine Itching    Current Outpatient Medications  Medication Sig Dispense Refill  . aspirin 81 MG tablet Take 81 mg by mouth daily.      . carvedilol (COREG) 25 MG tablet TAKE 1 TABLET BY MOUTH TWICE A DAY 180 tablet 3  . lisinopril (PRINIVIL,ZESTRIL) 20 MG tablet Take 1 tablet (20 mg total) by mouth 2 (two) times daily. 180 tablet 3  . pravastatin (PRAVACHOL) 80 MG tablet Take 1 tablet (80 mg total) by mouth at bedtime. 90 tablet 0   No current facility-administered medications for this visit.     Past Medical History:  Diagnosis Date  . Allergic rhinitis   . Bell's palsy   . Cancer ([place])    [phone]  . Cardiomyopathy, dilated ([place]) 08/26/2011   15% 2011, 55% 2012  . Carotid stenosis 09/12/2012   Bilateral, mild Lifeline screening  . CHF (congestive heart failure) ([place])   . Clotting disorder ([place])   . Colonic polyp   . DVT (deep venous thrombosis) ([place])   . Glucose intolerance (impaired glucose tolerance)   . HLD (hyperlipidemia)   . HTN (hypertension)   . NICM (nonischemic cardiomyopathy) ([place])    Coronaries; left main essentially was nonexistent or very short with  . PVD (peripheral vascular disease) ([place])     Past Surgical History:  Procedure Laterality Date  . COLONOSCOPY    . hysterectomy - unknown type    . LUMBAR FUSION  1983    . OOPHORECTOMY    . POLYPECTOMY    . TUBAL LIGATION      ROS:  As stated in the HPI and negative for all other systems.  PHYSICAL EXAM BP 120/76   Pulse 64   Ht 5\' 5"  (1.651 m)   Wt 142 lb 9.6 oz (64.7 kg)   BMI 23.73 kg/m   GENERAL:  Well appearing NECK:  No jugular venous distention, waveform within normal limits, carotid upstroke brisk and symmetric, no bruits, no thyromegaly LUNGS:  Clear to auscultation bilaterally CHEST:  Unremarkable HEART:  PMI not displaced or sustained,S1 and S2 within normal limits, no S3, no S4, no clicks, no rubs, no murmurs ABD:  Flat, positive bowel sounds normal in frequency in pitch, no bruits, no rebound, no guarding, no midline pulsatile mass, no hepatomegaly, no splenomegaly EXT:  2 plus pulses throughout, no edema, no cyanosis no clubbing   EKG: Sinus rhythm, rate 64, leftward axis, possible left atrial enlargement, no acute ST-T wave changes.    ASSESSMENT AND PLAN  CARDIOMYOPATHY:    Symptomatically there is been no change.  She does very well.  I have no reason to repeat imaging at this point.  She does well on the medications as listed so no change in therapy.  HTN:   I did increase  her lisinopril at the last visit.  Her blood pressures well controlled.  She will continue the meds as listed.  CAROTID STENOSIS: This was mild in the past.  No change in therapy.

## 2018-12-31 ENCOUNTER — Ambulatory Visit: Payer: Medicare Other | Admitting: Cardiology

## 2018-12-31 ENCOUNTER — Encounter: Payer: Self-pay | Admitting: Cardiology

## 2018-12-31 VITALS — BP 120/76 | HR 64 | Ht 65.0 in | Wt 142.6 lb

## 2018-12-31 DIAGNOSIS — I429 Cardiomyopathy, unspecified: Secondary | ICD-10-CM | POA: Diagnosis not present

## 2018-12-31 DIAGNOSIS — I10 Essential (primary) hypertension: Secondary | ICD-10-CM

## 2018-12-31 NOTE — Patient Instructions (Signed)
Medication Instructions:  Continue current medications  If you need a refill on your cardiac medications before your next appointment, please call your pharmacy.  Labwork: None Ordered   Take the provided lab slips with you to the lab for your blood draw.   When you have your labs (blood work) drawn today and your tests are completely normal, you will receive your results only by MyChart Message (if you have MyChart) -OR-  A paper copy in the mail.  If you have any lab test that is abnormal or we need to change your treatment, we will call you to review these results.  Testing/Procedures: None Ordered  Follow-Up: You will need a follow up appointment in 12 months.  Please call our office 2 months in advance to schedule this appointment.  You may see Dr Hochrein or one of the following Advanced Practice Providers on your designated Care Team:   Rhonda Barrett, PA-C . Kathryn Lawrence, DNP, ANP    At [facility], you and your health needs are our priority.  As part of our continuing mission to provide you with exceptional heart care, we have created designated Provider Care Teams.  These Care Teams include your primary Cardiologist (physician) and Advanced Practice Providers (APPs -  Physician Assistants and Nurse Practitioners) who all work together to provide you with the care you need, when you need it.  Thank you for choosing [facility] at [facility]!!     

## 2019-01-15 DIAGNOSIS — D485 Neoplasm of uncertain behavior of skin: Secondary | ICD-10-CM | POA: Diagnosis not present

## 2019-01-15 DIAGNOSIS — L821 Other seborrheic keratosis: Secondary | ICD-10-CM | POA: Diagnosis not present

## 2019-01-15 DIAGNOSIS — B079 Viral wart, unspecified: Secondary | ICD-10-CM | POA: Diagnosis not present

## 2019-01-15 DIAGNOSIS — L570 Actinic keratosis: Secondary | ICD-10-CM | POA: Diagnosis not present

## 2019-02-20 ENCOUNTER — Telehealth: Payer: Self-pay | Admitting: Cardiology

## 2019-02-20 NOTE — Telephone Encounter (Signed)
New Message:    Patient calling concering her SOB. Patient seems to think that she need a sooner appt. Please call patient.

## 2019-02-20 NOTE — Telephone Encounter (Signed)
Returned call to patient she stated she has been having sob this week.Stated sob woke her up this morning.She has noticed she is sob just walking through her house.No chest pain.No edema.She feels ok at present.Stated it is time to start working outside in her yard and she would like to be seen.Appointment scheduled with Jory Sims DNP 02/27/19 at 11:30 am.

## 2019-02-26 NOTE — Progress Notes (Signed)
Cardiology Office Note   Date:  02/27/2019   ID:  Carol Lawrence, DOB 07/08/1936, MRN [id number]  PCP:  Biagio Borg, MD  Cardiologist: [facility]  Chief Complaint  Patient presents with  . Shortness of Breath    states it comes and goes   . Cardiomyopathy     History of Present Illness: Carol Lawrence is a 83 y.o. female who presents for ongoing assessment and management of dilated NICM, most recent EF of 40%-45% with diffuse hypokinesis and mild LVH per echo on 05/19/2015.   She called the office with complaints of dyspnea walking in her house, no chest pain. She is normal active, working out in her yard, and is concerned about her symptoms. She requested appointment.   She states that she had noticed that her breathing has been more tenuous when she exerts herself, such as working in her yard, raking, [employer] research officer, government. She states that when she stops to rest, it gets better and she returns to her activity. She is finding that she has to stop more often in the last couple of weeks. She denies fluid retention weight gain or chest pressure.   She is also planning a cruise to [place] in May and is worried about the [place] virus outbreak not allowing travel on cruise ships. She denies flu-like symptoms, as she worried that she may starting to exhibit symptoms because of the dyspnea. She has not traveled out of the country or come in contact with those who have.   Past Medical History:  Diagnosis Date  . Allergic rhinitis   . Bell's palsy   . Cancer ([place])    [phone]  . Cardiomyopathy, dilated ([place]) 08/26/2011   15% 2011, 55% 2012  . Carotid stenosis 09/12/2012   Bilateral, mild Lifeline screening  . CHF (congestive heart failure) ([place])   . Clotting disorder ([place])   . Colonic polyp   . DVT (deep venous thrombosis) ([place])   . Glucose intolerance (impaired glucose tolerance)   . HLD (hyperlipidemia)   . HTN (hypertension)   . NICM (nonischemic cardiomyopathy) ([place])    Coronaries;  left main essentially was nonexistent or very short with  . PVD (peripheral vascular disease) ([place])     Past Surgical History:  Procedure Laterality Date  . COLONOSCOPY    . hysterectomy - unknown type    . LUMBAR FUSION  1983  . OOPHORECTOMY    . POLYPECTOMY    . TUBAL LIGATION       Current Outpatient Medications  Medication Sig Dispense Refill  . aspirin 81 MG tablet Take 81 mg by mouth daily.      . carvedilol (COREG) 25 MG tablet TAKE 1 TABLET BY MOUTH TWICE A DAY 180 tablet 3  . lisinopril (PRINIVIL,ZESTRIL) 20 MG tablet Take 1 tablet (20 mg total) by mouth 2 (two) times daily. 180 tablet 3  . pravastatin (PRAVACHOL) 80 MG tablet Take 1 tablet (80 mg total) by mouth at bedtime. 90 tablet 0  . hydrochlorothiazide (MICROZIDE) 12.5 MG capsule Take 1 capsule (12.5 mg total) by mouth daily. 30 capsule 3   No current facility-administered medications for this visit.     Allergies:   Amoxicillin and Codeine    Social History:  The patient  reports that she quit smoking about 38 years ago. She has never used smokeless tobacco. She reports that she does not drink alcohol.   Family History:  The patient's family history includes Breast cancer (age of onset: 20)  in her daughter; COPD in her mother and unknown relative; Colon polyps in her brother and sister; Coronary artery disease in her unknown relative; Hyperlipidemia in her unknown relative; Leukemia in her sister, sister, and unknown relative; Liver cancer in her unknown relative; Lung cancer in her unknown relative; Melanoma in her unknown relative; Osteoporosis in her mother and unknown relative.    ROS: All other systems are reviewed and negative. Unless otherwise mentioned in H&P    PHYSICAL EXAM: VS:  BP (!) 178/84   Pulse 84   Ht 5\' 5"  (1.651 m)   Wt 142 lb 6.4 oz (64.6 kg)   SpO2 94%   BMI 23.70 kg/m  , BMI Body mass index is 23.7 kg/m. GEN: Well nourished, well developed, in no acute distress HEENT:  normal Neck: no JVD, carotid bruits, or masses Cardiac: RRR; no murmurs, rubs, or gallops,no edema  Respiratory:  Clear to auscultation bilaterally, normal work of breathing mild crackles in the bases. Cleared with cough.  GI: soft, nontender, nondistended, + BS MS: no deformity or atrophy Skin: warm and dry, no rash Neuro:  Strength and sensation are intact Psych: euthymic mood, full affect   EKG:  Not competed this office visit.   Recent Labs: 12/05/2018: ALT 13; BUN 19; Creatinine, Ser 1.07; Hemoglobin 16.1; Platelets 131.0; Potassium 4.0; Sodium 143; TSH 3.44    Lipid Panel    Component Value Date/Time   CHOL 131 12/05/2018 1540   TRIG 173.0 (H) 12/05/2018 1540   HDL 36.60 (L) 12/05/2018 1540   CHOLHDL 4 12/05/2018 1540   VLDL 34.6 12/05/2018 1540   LDLCALC 60 12/05/2018 1540   LDLDIRECT 98.6 06/09/2010 1049      Wt Readings from Last 3 Encounters:  02/27/19 142 lb 6.4 oz (64.6 kg)  12/31/18 142 lb 9.6 oz (64.7 kg)  12/05/18 144 lb (65.3 kg)      Other studies Reviewed: Echocardiogram 06-10-2015 Left ventricle:  The cavity size was normal. Wall thickness was increased in a pattern of mild LVH. Systolic function was mildly to moderately reduced. The estimated ejection fraction was in the range of 40% to 45%. Diffuse hypokinesis. Doppler parameters are consistent with abnormal left ventricular relaxation (grade 1 diastolic dysfunction).  ASSESSMENT AND PLAN:  1. Dyspnea on exertion: She denies coughing or lung congestion. She feels better when she rests. Symptoms usually occur with strenuous activity such as raking her yard, and are relieved with rest. No fever or chills.   I will repeat her echocardiogram for changes in EF causing her symptoms. CXR for evaluation for possible recurrent bronchitis. I do not think she has symptoms of [place] virus at this time.   However, I also feel that she should avoid cruise ships and international travel until CBC approves  this. With cardiac patients and her age, I do not recommend she travel at this time.   2. NICM: Last EF improved in 2016, however she is becoming symptomatic concerning breathing status. I will repeat echo as discussed.   3. Hypertension: Not optimal for reduced EF. I will add low dose HCTZ 12.5 mg to her regimen. She may be having elevated BP with exertion. I will see her in a couple of weeks to evaluate her response to medications. I rechecked it in the exam room. It was decreased to 158/88. Will follow.   4. Sinus drainage and congestion; She may take OTC non-drowsy antihistamines for symptomatic relief.    Current medicines are reviewed at length with the patient  today.    Labs/ tests ordered today include: CXR, Echocardiogram.   Phill Myron. [place] Pugh, ANP, AACC   02/27/2019 12:20 PM    [facility] [address] Office [phone] Fax [phone]

## 2019-02-27 ENCOUNTER — Encounter: Payer: Self-pay | Admitting: Adult Health

## 2019-02-27 ENCOUNTER — Other Ambulatory Visit: Payer: Self-pay

## 2019-02-27 ENCOUNTER — Ambulatory Visit
Admission: RE | Admit: 2019-02-27 | Discharge: 2019-02-27 | Disposition: A | Discharge status: Home / Self Care | Payer: Medicare Other | Source: Ambulatory Visit | Attending: Adult Health | Admitting: Adult Health

## 2019-02-27 ENCOUNTER — Ambulatory Visit: Payer: Medicare Other | Admitting: Adult Health

## 2019-02-27 VITALS — BP 178/84 | HR 84 | Ht 65.0 in | Wt 142.4 lb

## 2019-02-27 DIAGNOSIS — R05 Cough: Secondary | ICD-10-CM

## 2019-02-27 DIAGNOSIS — R059 Cough, unspecified: Secondary | ICD-10-CM

## 2019-02-27 DIAGNOSIS — R0602 Shortness of breath: Secondary | ICD-10-CM

## 2019-02-27 DIAGNOSIS — I428 Other cardiomyopathies: Secondary | ICD-10-CM | POA: Diagnosis not present

## 2019-02-27 DIAGNOSIS — I10 Essential (primary) hypertension: Secondary | ICD-10-CM | POA: Diagnosis not present

## 2019-02-27 MED ORDER — HYDROCHLOROTHIAZIDE 12.5 MG PO CAPS: 12.5000 mg | ORAL_CAPSULE | Freq: Every day | ORAL | 3 refills | Status: DC

## 2019-02-27 NOTE — Patient Instructions (Signed)
Medication Instructions:  START HYDROCHLOROTHIAZIDE 12.5MG  DAILY MAY USE OTC-ZYRTEC OR CLARATIN If you need a refill on your cardiac medications before your next appointment, please call your pharmacy.  Testing/Procedures: Echocardiogram - Your physician has requested that you have an echocardiogram. Echocardiography is a painless test that uses sound waves to create images of your heart. It provides your doctor with information about the size and shape of your heart and how well your heart's chambers and valves are working. This procedure takes approximately one hour. There are no restrictions for this procedure. This will be performed at our [facility] location - [address].  Chest xray - Your physician has requested that you have a chest xray, is a fast and painless imaging test that uses certain electromagnetic waves to create pictures of the structures in and around your chest. This test can help diagnose and monitor conditions such as pneumonia and other lung issues his will be done at [facility], [place]. If you should need to call them their phone number is [phone].  Follow-Up: You will need a follow up appointment in 2 weeks AFTER ECHO.   You may see Minus Breeding, MD , Jory Sims, DNP, [place]  or one of the following Advanced Practice Providers on your designated Care Team:  Rosaria Ferries, PA-C, Jory Sims, DNP, [place], you and your health needs are our priority.  As part of our continuing mission to provide you with exceptional heart care, we have created designated Provider Care Teams.  These Care Teams include your primary Cardiologist (physician) and Advanced Practice Providers (APPs -  Physician Assistants and Nurse Practitioners) who all work together to provide you with the care you need, when you need it.  Thank you for choosing [facility] at [facility]!!

## 2019-03-07 ENCOUNTER — Other Ambulatory Visit (HOSPITAL_COMMUNITY): Payer: Medicare Other

## 2019-03-07 ENCOUNTER — Other Ambulatory Visit: Payer: Self-pay | Admitting: *Deleted

## 2019-03-07 MED ORDER — LISINOPRIL 20 MG PO TABS: 20.0000 mg | ORAL_TABLET | Freq: Two times a day (BID) | ORAL | 3 refills | Status: DC

## 2019-03-22 ENCOUNTER — Ambulatory Visit: Payer: Medicare Other | Admitting: Cardiology

## 2019-04-01 ENCOUNTER — Other Ambulatory Visit: Payer: Self-pay

## 2019-04-01 MED ORDER — PRAVASTATIN SODIUM 80 MG PO TABS: 80.0000 mg | ORAL_TABLET | Freq: Every day | ORAL | 3 refills | Status: DC

## 2019-05-24 ENCOUNTER — Other Ambulatory Visit: Payer: Self-pay

## 2019-05-24 ENCOUNTER — Ambulatory Visit (HOSPITAL_COMMUNITY)
Admission: RE | Admit: 2019-05-24 | Discharge: 2019-05-24 | Disposition: A | Discharge status: Home / Self Care | Payer: Medicare Other | Source: Ambulatory Visit | Attending: Adult Health | Admitting: Adult Health

## 2019-05-24 DIAGNOSIS — E785 Hyperlipidemia, unspecified: Secondary | ICD-10-CM | POA: Diagnosis not present

## 2019-05-24 DIAGNOSIS — I428 Other cardiomyopathies: Secondary | ICD-10-CM | POA: Diagnosis not present

## 2019-05-24 DIAGNOSIS — I10 Essential (primary) hypertension: Secondary | ICD-10-CM | POA: Insufficient documentation

## 2019-05-24 DIAGNOSIS — Z87891 Personal history of nicotine dependence: Secondary | ICD-10-CM | POA: Diagnosis not present

## 2019-05-24 DIAGNOSIS — I351 Nonrheumatic aortic (valve) insufficiency: Secondary | ICD-10-CM | POA: Insufficient documentation

## 2019-05-24 NOTE — Progress Notes (Signed)
  Echocardiogram 2D Echocardiogram has been performed.  Burnett Kanaris 05/24/2019, 3:36 PM

## 2019-06-25 ENCOUNTER — Other Ambulatory Visit: Payer: Self-pay | Admitting: Adult Health

## 2019-07-24 ENCOUNTER — Other Ambulatory Visit: Payer: Self-pay | Admitting: Internal Medicine

## 2019-07-24 DIAGNOSIS — Z1231 Encounter for screening mammogram for malignant neoplasm of breast: Secondary | ICD-10-CM

## 2019-08-05 ENCOUNTER — Telehealth: Payer: Self-pay | Admitting: Internal Medicine

## 2019-08-05 ENCOUNTER — Ambulatory Visit: Payer: Self-pay | Admitting: *Deleted

## 2019-08-05 NOTE — Telephone Encounter (Signed)
Patient called stating that she has been having a sore throat for 2 days with white puss at the back of her throat. She is not experiencing any other symptoms. She is not able to do a virtual visit. Would you like for patient to be seen in the office?  Please advise.

## 2019-08-05 NOTE — Telephone Encounter (Signed)
  Reason for Disposition . [1] Pus on tonsils (back of throat) AND [2]  fever AND [3] swollen neck lymph nodes ("glands")    Onset > 24 hours ago, with pus on tonsil.  Answer Assessment - Initial Assessment Questions 1. ONSET: "When did the throat start hurting?" (Hours or days ago)      2 days  2. SEVERITY: "How bad is the sore throat?" (Scale 1-10; mild, moderate or severe)   - MILD (1-3):  doesn't interfere with eating or normal activities   - MODERATE (4-7): interferes with eating some solids and normal activities   - SEVERE (8-10):  excruciating pain, interferes with most normal activities   - SEVERE DYSPHAGIA: can't swallow liquids, drooling          2/10- Mild discomfort   3. STREP EXPOSURE: "Has there been any exposure to strep within the past week?" If so, ask: "What type of contact occurred?"      No exposure known   4.  VIRAL SYMPTOMS: "Are there any symptoms of a cold, such as a runny nose, cough, hoarse voice or red eyes?"      Runny nose  5. FEVER: "Do you have a fever?" If so, ask: "What is your temperature, how was it measured, and when did it start?" Denies fever       6. PUS ON THE TONSILS: "Is there pus on the tonsils in the back of your throat?" Pus on right tonsil       7. OTHER SYMPTOMS: "Do you have any other symptoms?" (e.g., difficulty breathing, headache, rash) Denies all       8. PREGNANCY: "Is there any chance you are pregnant?" "When was your last menstrual period?"     N/A  Protocols used: SORE THROAT-A-AH    Patient states she has had a sore throat for 2 days.  Noticed pus on her right tonsil.  No fever or cough, does have runny nose.  States she feels like there is something in the back of her throat that she needs to swallow, throat not swollen.  Transferred call to PCP office to schedule an appointment for evaluation.

## 2019-08-05 NOTE — Telephone Encounter (Signed)
Message from Beverley Fiedler sent at 08/05/2019 3:52 PM EDT  Summary: Clinical Advice   Patient has sore throat and has a white pus pocket on back of throat for 2 days. Patient would like to speak with nurse on recommendations. Please advise

## 2019-08-05 NOTE — Telephone Encounter (Signed)
[place] for [employer]

## 2019-08-06 ENCOUNTER — Encounter: Payer: Self-pay | Admitting: Internal Medicine

## 2019-08-06 ENCOUNTER — Other Ambulatory Visit (INDEPENDENT_AMBULATORY_CARE_PROVIDER_SITE_OTHER): Payer: Medicare Other

## 2019-08-06 ENCOUNTER — Ambulatory Visit (INDEPENDENT_AMBULATORY_CARE_PROVIDER_SITE_OTHER): Payer: Medicare Other | Admitting: Internal Medicine

## 2019-08-06 ENCOUNTER — Other Ambulatory Visit: Payer: Self-pay

## 2019-08-06 VITALS — BP 122/76 | HR 52 | Temp 98.4°F | Ht 65.0 in | Wt 141.0 lb

## 2019-08-06 DIAGNOSIS — R7302 Impaired glucose tolerance (oral): Secondary | ICD-10-CM

## 2019-08-06 DIAGNOSIS — J029 Acute pharyngitis, unspecified: Secondary | ICD-10-CM

## 2019-08-06 DIAGNOSIS — E611 Iron deficiency: Secondary | ICD-10-CM | POA: Diagnosis not present

## 2019-08-06 DIAGNOSIS — E785 Hyperlipidemia, unspecified: Secondary | ICD-10-CM

## 2019-08-06 DIAGNOSIS — E559 Vitamin D deficiency, unspecified: Secondary | ICD-10-CM

## 2019-08-06 DIAGNOSIS — Z0001 Encounter for general adult medical examination with abnormal findings: Secondary | ICD-10-CM

## 2019-08-06 DIAGNOSIS — I10 Essential (primary) hypertension: Secondary | ICD-10-CM

## 2019-08-06 DIAGNOSIS — E538 Deficiency of other specified B group vitamins: Secondary | ICD-10-CM

## 2019-08-06 DIAGNOSIS — Z23 Encounter for immunization: Secondary | ICD-10-CM | POA: Diagnosis not present

## 2019-08-06 LAB — URINALYSIS, ROUTINE W REFLEX MICROSCOPIC
Bilirubin Urine: NEGATIVE
Ketones, ur: NEGATIVE
Leukocytes,Ua: NEGATIVE
Nitrite: NEGATIVE
Specific Gravity, Urine: 1.025 (ref 1.000–1.030)
Urine Glucose: NEGATIVE
Urobilinogen, UA: 0.2 (ref 0.0–1.0)
pH: 6 (ref 5.0–8.0)

## 2019-08-06 LAB — CBC WITH DIFFERENTIAL/PLATELET
Basophils Absolute: 0.1 10*3/uL (ref 0.0–0.1)
Basophils Relative: 1 % (ref 0.0–3.0)
Eosinophils Absolute: 0.1 10*3/uL (ref 0.0–0.7)
Eosinophils Relative: 2.3 % (ref 0.0–5.0)
HCT: 48.3 % — ABNORMAL HIGH (ref 36.0–46.0)
Hemoglobin: 16.2 g/dL — ABNORMAL HIGH (ref 12.0–15.0)
Lymphocytes Relative: 20.5 % (ref 12.0–46.0)
Lymphs Abs: 1.2 10*3/uL (ref 0.7–4.0)
MCHC: 33.4 g/dL (ref 30.0–36.0)
MCV: 91.3 fl (ref 78.0–100.0)
Monocytes Absolute: 0.6 10*3/uL (ref 0.1–1.0)
Monocytes Relative: 9.8 % (ref 3.0–12.0)
Neutro Abs: 3.9 10*3/uL (ref 1.4–7.7)
Neutrophils Relative %: 66.4 % (ref 43.0–77.0)
Platelets: 135 10*3/uL — ABNORMAL LOW (ref 150.0–400.0)
RBC: 5.29 Mil/uL — ABNORMAL HIGH (ref 3.87–5.11)
RDW: 14.2 % (ref 11.5–15.5)
WBC: 5.8 10*3/uL (ref 4.0–10.5)

## 2019-08-06 LAB — LIPID PANEL
Cholesterol: 136 mg/dL (ref 0–200)
HDL: 33.1 mg/dL — ABNORMAL LOW (ref >39.00)
LDL Cholesterol: 65 mg/dL (ref 0–99)
NonHDL: 103.23
Total CHOL/HDL Ratio: 4
Triglycerides: 193 mg/dL — ABNORMAL HIGH (ref 0.0–149.0)
VLDL: 38.6 mg/dL (ref 0.0–40.0)

## 2019-08-06 LAB — IBC PANEL
Iron: 120 ug/dL (ref 42–145)
Saturation Ratios: 31.5 % (ref 20.0–50.0)
Transferrin: 272 mg/dL (ref 212.0–360.0)

## 2019-08-06 LAB — HEMOGLOBIN A1C: Hgb A1c MFr Bld: 6.9 % — ABNORMAL HIGH (ref 4.6–6.5)

## 2019-08-06 LAB — BASIC METABOLIC PANEL
BUN: 19 mg/dL (ref 6–23)
CO2: 31 mEq/L (ref 19–32)
Calcium: 9.9 mg/dL (ref 8.4–10.5)
Chloride: 104 mEq/L (ref 96–112)
Creatinine, Ser: 1.04 mg/dL (ref 0.40–1.20)
GFR: 50.88 mL/min — ABNORMAL LOW (ref >60.00)
Glucose, Bld: 105 mg/dL — ABNORMAL HIGH (ref 70–99)
Potassium: 4 mEq/L (ref 3.5–5.1)
Sodium: 142 mEq/L (ref 135–145)

## 2019-08-06 LAB — HEPATIC FUNCTION PANEL
ALT: 20 U/L (ref 0–35)
AST: 18 U/L (ref 0–37)
Albumin: 4.6 g/dL (ref 3.5–5.2)
Alkaline Phosphatase: 52 U/L (ref 39–117)
Bilirubin, Direct: 0.1 mg/dL (ref 0.0–0.3)
Total Bilirubin: 0.7 mg/dL (ref 0.2–1.2)
Total Protein: 7.4 g/dL (ref 6.0–8.3)

## 2019-08-06 LAB — VITAMIN D 25 HYDROXY (VIT D DEFICIENCY, FRACTURES): VITD: 34.59 ng/mL (ref 30.00–100.00)

## 2019-08-06 LAB — VITAMIN B12: Vitamin B-12: 308 pg/mL (ref 211–911)

## 2019-08-06 LAB — TSH: TSH: 3.91 u[IU]/mL (ref 0.35–4.50)

## 2019-08-06 MED ORDER — AZITHROMYCIN 250 MG PO TABS: ORAL_TABLET | ORAL | 1 refills | Status: DC

## 2019-08-06 NOTE — Assessment & Plan Note (Signed)
stable overall by history and exam, recent data reviewed with pt, and pt to continue medical treatment as before,  to f/u any worsening symptoms or concerns  

## 2019-08-06 NOTE — Progress Notes (Signed)
Subjective:    Patient ID: Carol Lawrence, female    DOB: November 14, 1936, 83 y.o.   MRN: [id number]  HPI  Here for wellness and f/u;  Overall doing ok;  Pt denies Chest pain, worsening SOB, DOE, wheezing, orthopnea, PND, worsening LE edema, palpitations, dizziness or syncope.  Pt denies neurological change such as new headache, facial or extremity weakness.  Pt denies polydipsia, polyuria, or low sugar symptoms. Pt states overall good compliance with treatment and medications, good tolerability, and has been trying to follow appropriate diet.  Pt denies worsening depressive symptoms, suicidal ideation or panic. No fever, night sweats, wt loss, loss of appetite, or other constitutional symptoms.  Pt states good ability with ADL's, has low fall risk, home safety reviewed and adequate, no other significant changes in hearing or vision, and only occasionally active with exercise. Does also have no fever but with ST x 3 days with white patch to right post pharynx, worse to swallow, better to not eat, feels mild ill, fatigued, but no ear or sinus congestion.  Has mild non prod cough only Past Medical History:  Diagnosis Date  . Allergic rhinitis   . Bell's palsy   . Cancer ([place])    [phone]  . Cardiomyopathy, dilated ([place]) 08/26/2011   15% 2011, 55% 2012  . Carotid stenosis 09/12/2012   Bilateral, mild Lifeline screening  . CHF (congestive heart failure) ([place])   . Clotting disorder ([place])   . Colonic polyp   . DVT (deep venous thrombosis) ([place])   . Glucose intolerance (impaired glucose tolerance)   . HLD (hyperlipidemia)   . HTN (hypertension)   . NICM (nonischemic cardiomyopathy) ([place])    Coronaries; left main essentially was nonexistent or very short with  . PVD (peripheral vascular disease) ([place])    Past Surgical History:  Procedure Laterality Date  . COLONOSCOPY    . hysterectomy - unknown type    . LUMBAR FUSION  1983  . OOPHORECTOMY    . POLYPECTOMY    . TUBAL LIGATION      reports that she quit smoking about 38 years ago. She has never used smokeless tobacco. She reports that she does not drink alcohol. No history on file for drug. family history includes Breast cancer (age of onset: 94) in her daughter; COPD in her mother and unknown relative; Colon polyps in her brother and sister; Coronary artery disease in her unknown relative; Hyperlipidemia in her unknown relative; Leukemia in her sister, sister, and unknown relative; Liver cancer in her unknown relative; Lung cancer in her unknown relative; Melanoma in her unknown relative; Osteoporosis in her mother and unknown relative. Allergies  Allergen Reactions  . Amoxicillin Itching    Curam---from [place]  . Codeine Itching   Current Outpatient Medications on File Prior to Visit  Medication Sig Dispense Refill  . aspirin 81 MG tablet Take 81 mg by mouth daily.      . carvedilol (COREG) 25 MG tablet TAKE 1 TABLET BY MOUTH TWICE A DAY 180 tablet 3  . hydrochlorothiazide (MICROZIDE) 12.5 MG capsule TAKE 1 CAPSULE BY MOUTH EVERY DAY 90 capsule 3  . lisinopril (PRINIVIL,ZESTRIL) 20 MG tablet Take 1 tablet (20 mg total) by mouth 2 (two) times daily. 180 tablet 3  . pravastatin (PRAVACHOL) 80 MG tablet Take 1 tablet (80 mg total) by mouth at bedtime. 90 tablet 3   No current facility-administered medications on file prior to visit.    Review of Systems VS noted,  Constitutional: Pt  appears in NAD HENT: Head: NCAT.  Right Ear: External ear normal.  Left Ear: External ear normal.  Eyes: . Pupils are equal, round, and reactive to light. Conjunctivae and EOM are normal Nose: without d/c or deformity Neck: Neck supple. Gross normal ROM Cardiovascular: Normal rate and regular rhythm.   Pulmonary/Chest: Effort normal and breath sounds without rales or wheezing.  Abd:  Soft, NT, ND, + BS, no organomegaly Neurological: Pt is alert. At baseline orientation, motor grossly intact Skin: Skin is warm. No rashes, other new  lesions, no LE edema Psychiatric: Pt behavior is normal without agitation  All other system neg per pt    Objective:   Physical Exam BP 122/76   Pulse (!) 52   Temp 98.4 F (36.9 C) (Oral)   Ht 5\' 5"  (1.651 m)   Wt 141 lb (64 kg)   SpO2 94%   BMI 23.46 kg/m  VS noted, mild ill Constitutional: Pt is oriented to person, place, and time. Appears well-developed and well-nourished, in no significant distress and comfortable Head: Normocephalic and atraumatic  Bilat tm's with mild.  Max sinus areas mild tender.  Pharynx with mod erythema, +  exudate Eyes: Conjunctivae and EOM are normal. Pupils are equal, round, and reactive to light Right Ear: External ear normal without discharge Left Ear: External ear normal without discharge Nose: Nose without discharge or deformity Mouth/Throat: Oropharynx is without other ulcerations and moist  Neck: Normal range of motion. Neck supple. No JVD present. No tracheal deviation present or significant neck LA or mass Cardiovascular: Normal rate, regular rhythm, normal heart sounds and intact distal pulses.   Pulmonary/Chest: WOB normal and breath sounds without rales or wheezing  Abdominal: Soft. Bowel sounds are normal. NT. No HSM  Musculoskeletal: Normal range of motion. Exhibits no edema Lymphadenopathy: Has no other cervical adenopathy.  Neurological: Pt is alert and oriented to person, place, and time. Pt has normal reflexes. No cranial nerve deficit. Motor grossly intact, Gait intact Skin: Skin is warm and dry. No rash noted or new ulcerations Psychiatric:  Has normal mood and affect. Behavior is normal without agitation No other exam findings Lab Results  Component Value Date   WBC 5.4 12/05/2018   HGB 16.1 (H) 12/05/2018   HCT 48.3 (H) 12/05/2018   PLT 131.0 (L) 12/05/2018   GLUCOSE 117 (H) 12/05/2018   CHOL 131 12/05/2018   TRIG 173.0 (H) 12/05/2018   HDL 36.60 (L) 12/05/2018   LDLDIRECT 98.6 06/09/2010   LDLCALC 60 12/05/2018   ALT  13 12/05/2018   AST 14 12/05/2018   NA 143 12/05/2018   K 4.0 12/05/2018   CL 106 12/05/2018   CREATININE 1.07 12/05/2018   BUN 19 12/05/2018   CO2 29 12/05/2018   TSH 3.44 12/05/2018   INR 1.0 ratio 03/25/2010   HGBA1C 6.4 12/05/2018          Assessment & Plan:

## 2019-08-06 NOTE — Assessment & Plan Note (Addendum)
Mild to mod, for antibx course,  to f/u any worsening symptoms or concerns  In addition to the time spent performing CPE, I spent an additional 25 minutes face to face,in which greater than 50% of this time was spent in counseling and coordination of care for patient's acute illness as documented, including the differential dx, treatment, further evaluation and other management of acute pharyngitis, hyperglycemia, HLD, HTN

## 2019-08-06 NOTE — Patient Instructions (Addendum)
You had the Tdap tetanus shot today  Please take all new medication as prescribed - the antibiotic  Please continue all other medications as before, and refills have been done if requested.  Please have the pharmacy call with any other refills you may need.  Please continue your efforts at being more active, low cholesterol diet, and weight control.  You are otherwise up to date with prevention measures today.  Please keep your appointments with your specialists as you may have planned  Please go to the LAB in the Basement (turn left off the elevator) for the tests to be done today  You will be contacted by phone if any changes need to be made immediately.  Otherwise, you will receive a letter about your results with an explanation, but please check with MyChart first.  Please remember to sign up for MyChart if you have not done so, as this will be important to you in the future with finding out test results, communicating by private email, and scheduling acute appointments online when needed.  Please return in 6 months, or sooner if needed

## 2019-08-06 NOTE — Telephone Encounter (Signed)
Appointment scheduled.

## 2019-08-06 NOTE — Assessment & Plan Note (Signed)

## 2019-08-14 ENCOUNTER — Other Ambulatory Visit: Payer: Self-pay

## 2019-08-14 DIAGNOSIS — L821 Other seborrheic keratosis: Secondary | ICD-10-CM | POA: Diagnosis not present

## 2019-08-14 DIAGNOSIS — L820 Inflamed seborrheic keratosis: Secondary | ICD-10-CM | POA: Diagnosis not present

## 2019-08-14 DIAGNOSIS — D225 Melanocytic nevi of trunk: Secondary | ICD-10-CM | POA: Diagnosis not present

## 2019-08-14 DIAGNOSIS — D485 Neoplasm of uncertain behavior of skin: Secondary | ICD-10-CM | POA: Diagnosis not present

## 2019-08-14 DIAGNOSIS — L814 Other melanin hyperpigmentation: Secondary | ICD-10-CM | POA: Diagnosis not present

## 2019-08-14 DIAGNOSIS — D2262 Melanocytic nevi of left upper limb, including shoulder: Secondary | ICD-10-CM | POA: Diagnosis not present

## 2019-08-14 DIAGNOSIS — L570 Actinic keratosis: Secondary | ICD-10-CM | POA: Diagnosis not present

## 2019-08-14 DIAGNOSIS — D1801 Hemangioma of skin and subcutaneous tissue: Secondary | ICD-10-CM | POA: Diagnosis not present

## 2019-08-14 MED ORDER — CARVEDILOL 25 MG PO TABS: 25.0000 mg | ORAL_TABLET | Freq: Two times a day (BID) | ORAL | 3 refills | Status: DC

## 2019-09-05 ENCOUNTER — Other Ambulatory Visit: Payer: Self-pay

## 2019-09-05 ENCOUNTER — Ambulatory Visit
Admission: RE | Admit: 2019-09-05 | Discharge: 2019-09-05 | Disposition: A | Discharge status: Home / Self Care | Payer: Medicare Other | Source: Ambulatory Visit | Attending: Internal Medicine | Admitting: Internal Medicine

## 2019-09-05 DIAGNOSIS — Z1231 Encounter for screening mammogram for malignant neoplasm of breast: Secondary | ICD-10-CM | POA: Diagnosis not present

## 2019-09-12 DIAGNOSIS — Z779 Other contact with and (suspected) exposures hazardous to health: Secondary | ICD-10-CM | POA: Diagnosis not present

## 2019-09-12 DIAGNOSIS — Z6824 Body mass index (BMI) 24.0-24.9, adult: Secondary | ICD-10-CM | POA: Diagnosis not present

## 2019-09-12 DIAGNOSIS — N958 Other specified menopausal and perimenopausal disorders: Secondary | ICD-10-CM | POA: Diagnosis not present

## 2019-09-12 DIAGNOSIS — M8588 Other specified disorders of bone density and structure, other site: Secondary | ICD-10-CM | POA: Diagnosis not present

## 2019-09-13 ENCOUNTER — Ambulatory Visit (INDEPENDENT_AMBULATORY_CARE_PROVIDER_SITE_OTHER): Payer: Medicare Other

## 2019-09-13 ENCOUNTER — Other Ambulatory Visit: Payer: Self-pay

## 2019-09-13 DIAGNOSIS — Z23 Encounter for immunization: Secondary | ICD-10-CM

## 2019-12-10 ENCOUNTER — Encounter: Payer: Self-pay | Admitting: Internal Medicine

## 2019-12-10 ENCOUNTER — Other Ambulatory Visit: Payer: Self-pay

## 2019-12-10 ENCOUNTER — Ambulatory Visit (INDEPENDENT_AMBULATORY_CARE_PROVIDER_SITE_OTHER): Payer: Medicare Other | Admitting: Internal Medicine

## 2019-12-10 VITALS — BP 152/80 | HR 70 | Temp 98.8°F | Ht 64.0 in | Wt 142.0 lb

## 2019-12-10 DIAGNOSIS — I10 Essential (primary) hypertension: Secondary | ICD-10-CM | POA: Diagnosis not present

## 2019-12-10 DIAGNOSIS — E785 Hyperlipidemia, unspecified: Secondary | ICD-10-CM

## 2019-12-10 DIAGNOSIS — R7302 Impaired glucose tolerance (oral): Secondary | ICD-10-CM

## 2019-12-10 LAB — BASIC METABOLIC PANEL
BUN: 20 mg/dL (ref 6–23)
CO2: 31 mEq/L (ref 19–32)
Calcium: 9.5 mg/dL (ref 8.4–10.5)
Chloride: 102 mEq/L (ref 96–112)
Creatinine, Ser: 1.04 mg/dL (ref 0.40–1.20)
GFR: 50.83 mL/min — ABNORMAL LOW (ref >60.00)
Glucose, Bld: 145 mg/dL — ABNORMAL HIGH (ref 70–99)
Potassium: 3.5 mEq/L (ref 3.5–5.1)
Sodium: 140 mEq/L (ref 135–145)

## 2019-12-10 LAB — LIPID PANEL
Cholesterol: 137 mg/dL (ref 0–200)
HDL: 33.2 mg/dL — ABNORMAL LOW (ref >39.00)
NonHDL: 104.2
Total CHOL/HDL Ratio: 4
Triglycerides: 205 mg/dL — ABNORMAL HIGH (ref 0.0–149.0)
VLDL: 41 mg/dL — ABNORMAL HIGH (ref 0.0–40.0)

## 2019-12-10 LAB — LDL CHOLESTEROL, DIRECT: Direct LDL: 76 mg/dL

## 2019-12-10 LAB — HEMOGLOBIN A1C: Hgb A1c MFr Bld: 6.9 % — ABNORMAL HIGH (ref 4.6–6.5)

## 2019-12-10 LAB — HEPATIC FUNCTION PANEL
ALT: 16 U/L (ref 0–35)
AST: 17 U/L (ref 0–37)
Albumin: 4.2 g/dL (ref 3.5–5.2)
Alkaline Phosphatase: 45 U/L (ref 39–117)
Bilirubin, Direct: 0.1 mg/dL (ref 0.0–0.3)
Total Bilirubin: 0.6 mg/dL (ref 0.2–1.2)
Total Protein: 6.6 g/dL (ref 6.0–8.3)

## 2019-12-10 NOTE — Progress Notes (Signed)
Subjective:    Patient ID: Carol Lawrence, female    DOB: April 15, 1936, 83 y.o.   MRN: [id number]  HPI  Here to f/u; overall doing ok,  Pt denies chest pain, increasing sob or doe, wheezing, orthopnea, PND, increased LE swelling, palpitations, dizziness or syncope.  Pt denies new neurological symptoms such as new headache, or facial or extremity weakness or numbness.  Pt denies polydipsia, polyuria, or low sugar episode.  Pt states overall good compliance with meds, mostly trying to follow appropriate diet, with wt overall stable,  but little exercise however.  BP at home better controlled per pt, declines changes BP Readings from Last 3 Encounters:  12/10/19 (!) 152/80  08/06/19 122/76  02/27/19 (!) 178/84   Past Medical History:  Diagnosis Date  . Allergic rhinitis   . Bell's palsy   . Cancer ([place])    [phone]  . Cardiomyopathy, dilated ([place]) 08/26/2011   15% 2011, 55% 2012  . Carotid stenosis 09/12/2012   Bilateral, mild Lifeline screening  . CHF (congestive heart failure) ([place])   . Clotting disorder ([place])   . Colonic polyp   . DVT (deep venous thrombosis) ([place])   . Glucose intolerance (impaired glucose tolerance)   . HLD (hyperlipidemia)   . HTN (hypertension)   . NICM (nonischemic cardiomyopathy) ([place])    Coronaries; left main essentially was nonexistent or very short with  . PVD (peripheral vascular disease) ([place])    Past Surgical History:  Procedure Laterality Date  . COLONOSCOPY    . hysterectomy - unknown type    . LUMBAR FUSION  1983  . OOPHORECTOMY    . POLYPECTOMY    . TUBAL LIGATION      reports that she quit smoking about 39 years ago. She has never used smokeless tobacco. She reports previous drug use. She reports that she does not drink alcohol. family history includes Breast cancer (age of onset: 62) in her daughter; COPD in her mother and another family member; Colon polyps in her brother and sister; Coronary artery disease in an other family member;  Hyperlipidemia in an other family member; Leukemia in her sister, sister and another family member; Liver cancer in an other family member; Lung cancer in an other family member; Melanoma in an other family member; Osteoporosis in her mother and another family member. Allergies  Allergen Reactions  . Amoxicillin Itching    Curam---from [place]  . Codeine Itching   Current Outpatient Medications on File Prior to Visit  Medication Sig Dispense Refill  . aspirin 81 MG tablet Take 81 mg by mouth daily.      . carvedilol (COREG) 25 MG tablet Take 1 tablet (25 mg total) by mouth 2 (two) times daily. 180 tablet 3  . cholecalciferol (VITAMIN D3) 25 MCG (1000 UT) tablet Take 1,000 Units by mouth daily.    . hydrochlorothiazide (MICROZIDE) 12.5 MG capsule TAKE 1 CAPSULE BY MOUTH EVERY DAY 90 capsule 3  . pravastatin (PRAVACHOL) 80 MG tablet Take 1 tablet (80 mg total) by mouth at bedtime. 90 tablet 3   No current facility-administered medications on file prior to visit.   Review of Systems  Constitutional: Negative for other unusual diaphoresis or sweats HENT: Negative for ear discharge or swelling Eyes: Negative for other worsening visual disturbances Respiratory: Negative for stridor or other swelling  Gastrointestinal: Negative for worsening distension or other blood Genitourinary: Negative for retention or other urinary change Musculoskeletal: Negative for other MSK pain or swelling Skin: Negative for  color change or other new lesions Neurological: Negative for worsening tremors and other numbness  Psychiatric/Behavioral: Negative for worsening agitation or other fatigue All otherwise neg per pt     Objective:   Physical Exam BP (!) 152/80   Pulse 70   Temp 98.8 F (37.1 C)   Ht 5\' 4"  (1.626 m)   Wt 142 lb (64.4 kg)   SpO2 95%   BMI 24.37 kg/m  VS noted,  Constitutional: Pt appears in NAD HENT: Head: NCAT.  Right Ear: External ear normal.  Left Ear: External ear normal.    Eyes: . Pupils are equal, round, and reactive to light. Conjunctivae and EOM are normal Nose: without d/c or deformity Neck: Neck supple. Gross normal ROM Cardiovascular: Normal rate and regular rhythm.   Pulmonary/Chest: Effort normal and breath sounds without rales or wheezing.  Abd:  Soft, NT, ND, + BS, no organomegaly Neurological: Pt is alert. At baseline orientation, motor grossly intact Skin: Skin is warm. No rashes, other new lesions, no LE edema Psychiatric: Pt behavior is normal without agitation  All otherwise neg per pt  Lab Results  Component Value Date   WBC 5.8 08/06/2019   HGB 16.2 (H) 08/06/2019   HCT 48.3 (H) 08/06/2019   PLT 135.0 (L) 08/06/2019   GLUCOSE 145 (H) 12/10/2019   CHOL 137 12/10/2019   TRIG 205.0 (H) 12/10/2019   HDL 33.20 (L) 12/10/2019   LDLDIRECT 76.0 12/10/2019   LDLCALC 65 08/06/2019   ALT 16 12/10/2019   AST 17 12/10/2019   NA 140 12/10/2019   K 3.5 12/10/2019   CL 102 12/10/2019   CREATININE 1.04 12/10/2019   BUN 20 12/10/2019   CO2 31 12/10/2019   TSH 3.91 08/06/2019   INR 1.0 ratio 03/25/2010   HGBA1C 6.9 (H) 12/10/2019      Assessment & Plan:

## 2019-12-10 NOTE — Patient Instructions (Signed)
Please continue all other medications as before, and refills have been done if requested.  Please have the pharmacy call with any other refills you may need.  Please continue your efforts at being more active, low cholesterol diet, and weight control.  Please keep your appointments with your specialists as you may have planned  Please go to the LAB at the blood drawing area for the tests to be done  You will be contacted by phone if any changes need to be made immediately.  Otherwise, you will receive a letter about your results with an explanation, but please check with MyChart first.  Please return in 6 months, or sooner if needed

## 2019-12-15 ENCOUNTER — Other Ambulatory Visit: Payer: Self-pay | Admitting: Cardiology

## 2019-12-20 ENCOUNTER — Encounter: Payer: Self-pay | Admitting: Internal Medicine

## 2019-12-20 NOTE — Assessment & Plan Note (Signed)
stable overall by history and exam, recent data reviewed with pt, and pt to continue medical treatment as before,  to f/u any worsening symptoms or concerns  

## 2020-01-01 DIAGNOSIS — I340 Nonrheumatic mitral (valve) insufficiency: Secondary | ICD-10-CM | POA: Insufficient documentation

## 2020-01-01 DIAGNOSIS — Z7189 Other specified counseling: Secondary | ICD-10-CM | POA: Insufficient documentation

## 2020-01-01 NOTE — Progress Notes (Signed)
Cardiology Office Note   Date:  01/02/2020   ID:  Carol Lawrence, DOB May 31, 1936, MRN [id number]  PCP:  Biagio Borg, MD  Cardiologist:   Minus Breeding, MD   Chief Complaint  Patient presents with  . Cardiomyopathy      History of Present Illness: Carol Lawrence is a 84 y.o. female who presents for followup of her cardiomyopathy. Her EF improved from a low of 15% to 55%.  However, her EF was slightly lower at 45% in 2016 on follow up echo. This was unchanged last year.   Since I last saw her she has done OK.  No pain.  No SOB.  She reports occasional chest fluttering.  However, she is able to be active around her home without any difficulties.   Past Medical History:  Diagnosis Date  . Allergic rhinitis   . Bell's palsy   . Cancer ([place])    [phone]  . Cardiomyopathy, dilated ([place]) 08/26/2011   15% 2011, 55% 2012  . Carotid stenosis 09/12/2012   Bilateral, mild Lifeline screening  . CHF (congestive heart failure) ([place])   . Clotting disorder ([place])   . Colonic polyp   . DVT (deep venous thrombosis) ([place])   . Glucose intolerance (impaired glucose tolerance)   . HLD (hyperlipidemia)   . HTN (hypertension)   . NICM (nonischemic cardiomyopathy) ([place])    Coronaries; left main essentially was nonexistent or very short with  . PVD (peripheral vascular disease) ([place])     Past Surgical History:  Procedure Laterality Date  . COLONOSCOPY    . hysterectomy - unknown type    . LUMBAR FUSION  1983  . OOPHORECTOMY    . POLYPECTOMY    . TUBAL LIGATION       Current Outpatient Medications  Medication Sig Dispense Refill  . aspirin 81 MG tablet Take 81 mg by mouth daily.      . carvedilol (COREG) 25 MG tablet Take 1 tablet (25 mg total) by mouth 2 (two) times daily. 180 tablet 3  . cholecalciferol (VITAMIN D3) 25 MCG (1000 UT) tablet Take 1,000 Units by mouth daily.    . hydrochlorothiazide (MICROZIDE) 12.5 MG capsule TAKE 1 CAPSULE BY MOUTH EVERY DAY 90 capsule 3   . lisinopril (ZESTRIL) 20 MG tablet TAKE 1 TABLET BY MOUTH TWICE A DAY 180 tablet 1  . pravastatin (PRAVACHOL) 80 MG tablet Take 1 tablet (80 mg total) by mouth at bedtime. 90 tablet 3   No current facility-administered medications for this visit.    Allergies:   Amoxicillin and Codeine   ROS:  Please see the history of present illness.   Otherwise, review of systems are positive for none.   All other systems are reviewed and negative.    PHYSICAL EXAM: VS:  BP (!) 159/65   Pulse (!) 44   Temp (!) 97.1 F (36.2 C)   Ht 5' 4.75" (1.645 m)   Wt 138 lb (62.6 kg)   BMI 23.14 kg/m  , BMI Body mass index is 23.14 kg/m. GENERAL:  Well appearing NECK:  No jugular venous distention, waveform within normal limits, carotid upstroke brisk and symmetric, no bruits, no thyromegaly LUNGS:  Clear to auscultation bilaterally CHEST:  Unremarkable HEART:  PMI not displaced or sustained,S1 and S2 within normal limits, no S3, no S4, no clicks, no rubs, no murmurs ABD:  Flat, positive bowel sounds normal in frequency in pitch, no bruits, no rebound, no guarding, no midline pulsatile  mass, no hepatomegaly, no splenomegaly EXT:  2 plus pulses throughout, no edema, no cyanosis no clubbing   EKG:  EKG is ordered today. The ekg ordered today demonstrates sinus bradycardia, occasional junctional escape beat, questionable sinoatrial exit block.   Recent Labs: 08/06/2019: Hemoglobin 16.2; Platelets 135.0; TSH 3.91 12/10/2019: ALT 16; BUN 20; Creatinine, Ser 1.04; Potassium 3.5; Sodium 140    Lipid Panel    Component Value Date/Time   CHOL 137 12/10/2019 1417   TRIG 205.0 (H) 12/10/2019 1417   HDL 33.20 (L) 12/10/2019 1417   CHOLHDL 4 12/10/2019 1417   VLDL 41.0 (H) 12/10/2019 1417   LDLCALC 65 08/06/2019 1144   LDLDIRECT 76.0 12/10/2019 1417      Wt Readings from Last 3 Encounters:  01/02/20 138 lb (62.6 kg)  12/10/19 142 lb (64.4 kg)  08/06/19 141 lb (64 kg)      Other studies  Reviewed: Additional studies/ records that were reviewed today include: None. Review of the above records demonstrates:  Please see elsewhere in the note.     ASSESSMENT AND PLAN:   NICM:  EF was mildy reduced at 45 - 50% on echo in June.   She is doing well.  She tolerates the meds as listed.  At this point no change in therapy is planned.  Moderate MR: I will follow this up with an echo probably next February when I see her back.  Hypertension: This is unusual per her report.  She is going to keep a blood pressure diary and send this to me in the next week and I might need to make med adjustments.   Bradycardia: He has bradycardia as noted.  However, she has no symptoms related to this.  We had a discussion about this.  She is to let me know if she has any symptoms that could be related to this in the future.  We talked about what those symptoms might be.   Covid education: She is anxious to get the vaccine.  She has been making phone calls.  Current medicines are reviewed at length with the patient today.  The patient does not have concerns regarding medicines.  The following changes have been made:  no change  Labs/ tests ordered today include:   Orders Placed This Encounter  Procedures  . EKG 12-Lead     Disposition:   FU with me in 12 months    Signed, Minus Breeding, MD  01/02/2020 12:38 PM    [place]

## 2020-01-02 ENCOUNTER — Ambulatory Visit: Payer: Medicare Other | Admitting: Cardiology

## 2020-01-02 ENCOUNTER — Other Ambulatory Visit: Payer: Self-pay

## 2020-01-02 ENCOUNTER — Encounter: Payer: Self-pay | Admitting: Cardiology

## 2020-01-02 VITALS — BP 159/65 | HR 44 | Temp 97.1°F | Ht 64.75 in | Wt 138.0 lb

## 2020-01-02 DIAGNOSIS — I340 Nonrheumatic mitral (valve) insufficiency: Secondary | ICD-10-CM

## 2020-01-02 DIAGNOSIS — R0602 Shortness of breath: Secondary | ICD-10-CM

## 2020-01-02 DIAGNOSIS — Z7189 Other specified counseling: Secondary | ICD-10-CM

## 2020-01-02 DIAGNOSIS — I10 Essential (primary) hypertension: Secondary | ICD-10-CM | POA: Diagnosis not present

## 2020-01-02 DIAGNOSIS — I428 Other cardiomyopathies: Secondary | ICD-10-CM | POA: Diagnosis not present

## 2020-01-02 NOTE — Patient Instructions (Signed)
Medication Instructions:  No changes *If you need a refill on your cardiac medications before your next appointment, please call your pharmacy*  Lab Work: None   Testing/Procedures: None   Follow-Up: At [facility], you and your health needs are our priority.  As part of our continuing mission to provide you with exceptional heart care, we have created designated Provider Care Teams.  These Care Teams include your primary Cardiologist (physician) and Advanced Practice Providers (APPs -  Physician Assistants and Nurse Practitioners) who all work together to provide you with the care you need, when you need it.  Your next appointment:   1 year(s)  You will receive a reminder letter in the mail two months in advance. If you don't receive a letter, please call our office to schedule the follow-up appointment.   The format for your next appointment:   In Person  Provider:   Minus Breeding, MD  Other Instructions Record your blood pressure

## 2020-01-22 ENCOUNTER — Telehealth: Payer: Self-pay | Admitting: *Deleted

## 2020-01-22 MED ORDER — CHLORTHALIDONE 25 MG PO TABS: 25.0000 mg | ORAL_TABLET | Freq: Every day | ORAL | 3 refills | Status: DC

## 2020-01-22 NOTE — Telephone Encounter (Signed)
Blood pressures readings received, range 129-160's/60's-80's with one reading lower at 117/56 and one higher at 187/78  Dr Percival Spanish has reviewed and would like patient to stop HCTZ and start Chlorthalidone 25 mg daily   Advised patient, verbalized understanding

## 2020-02-17 IMAGING — MG MM DIGITAL SCREENING BILAT W/ TOMO W/ CAD
8 series · 8 of 24 positions shown · non-contrast
Comparison: Previous exam(s).

CLINICAL DATA: Screening.

EXAM:
DIGITAL SCREENING BILATERAL MAMMOGRAM WITH TOMO AND CAD

[R MLO synth-2D]
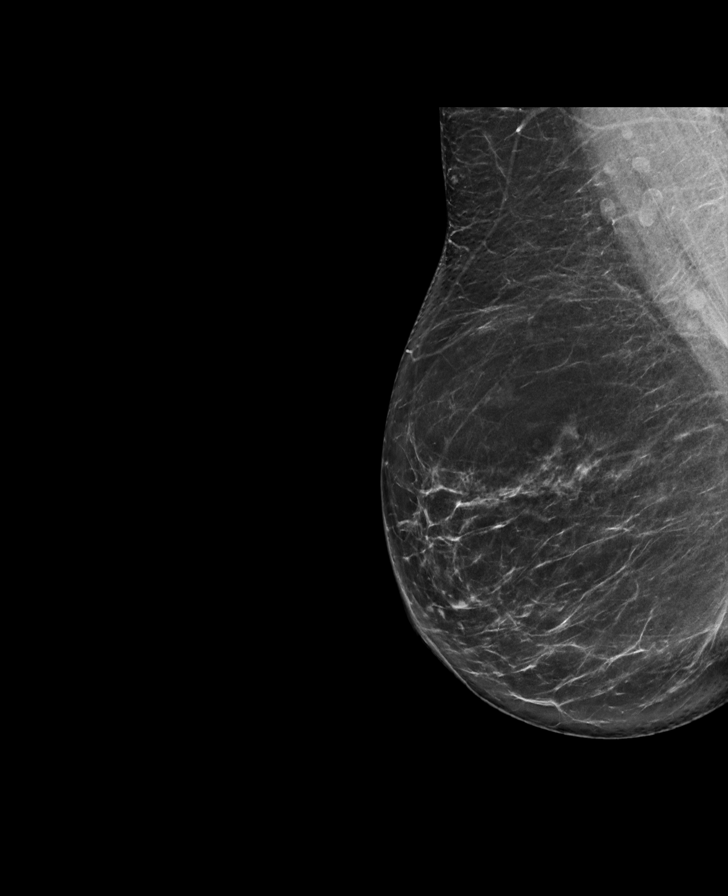

[L MLO synth-2D]
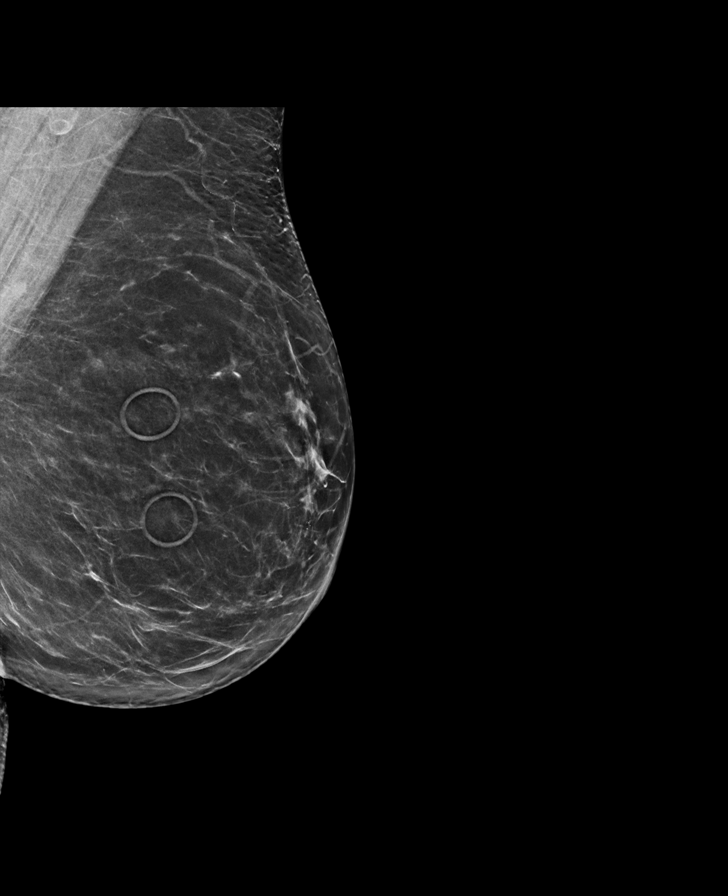

[L CC synth-2D]
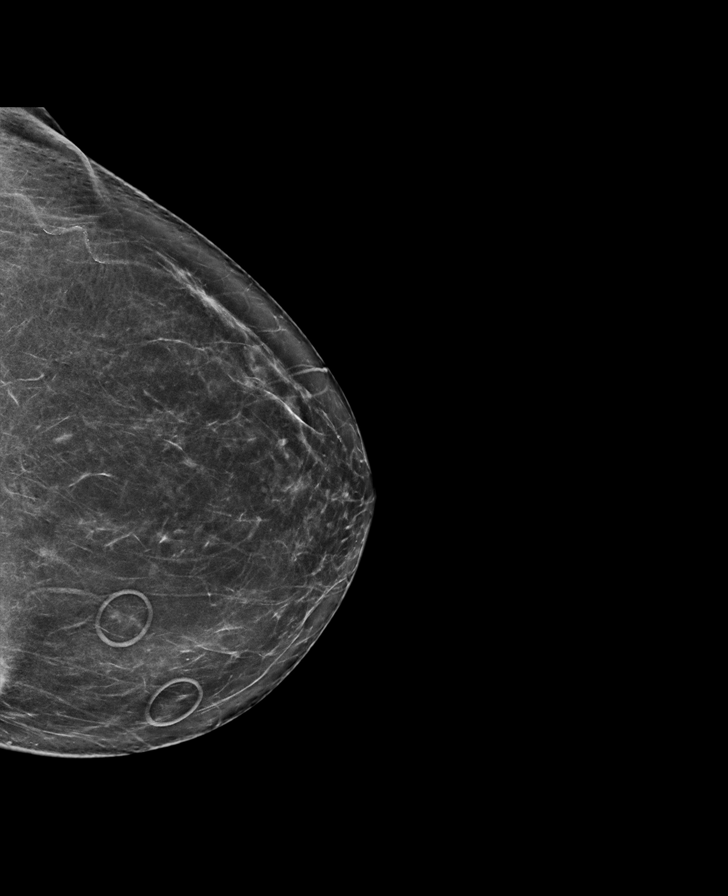

[R CC synth-2D]
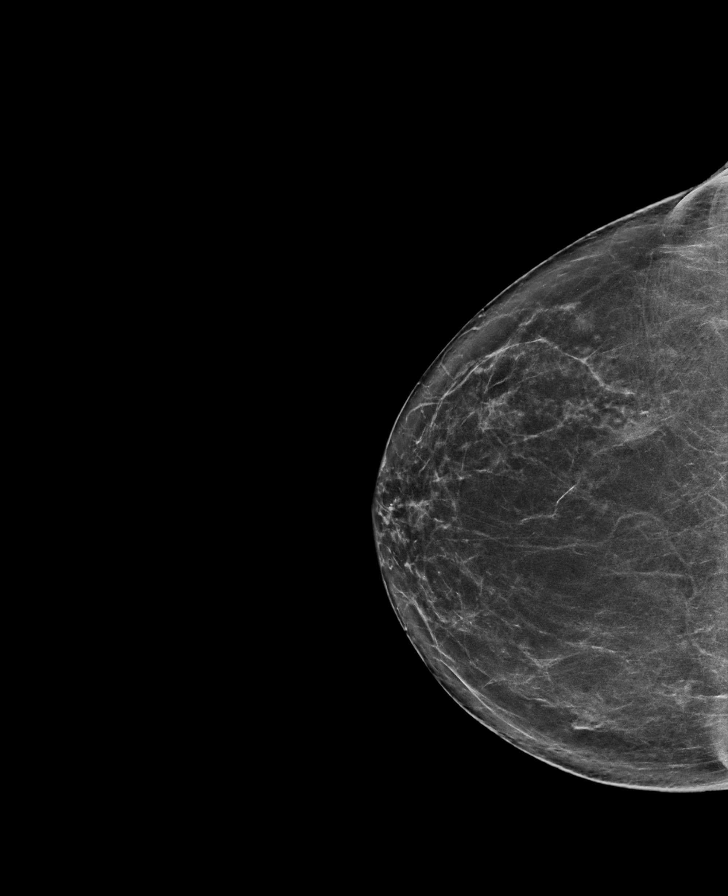

[L MLO tomo · tomo slice 37/73.0]
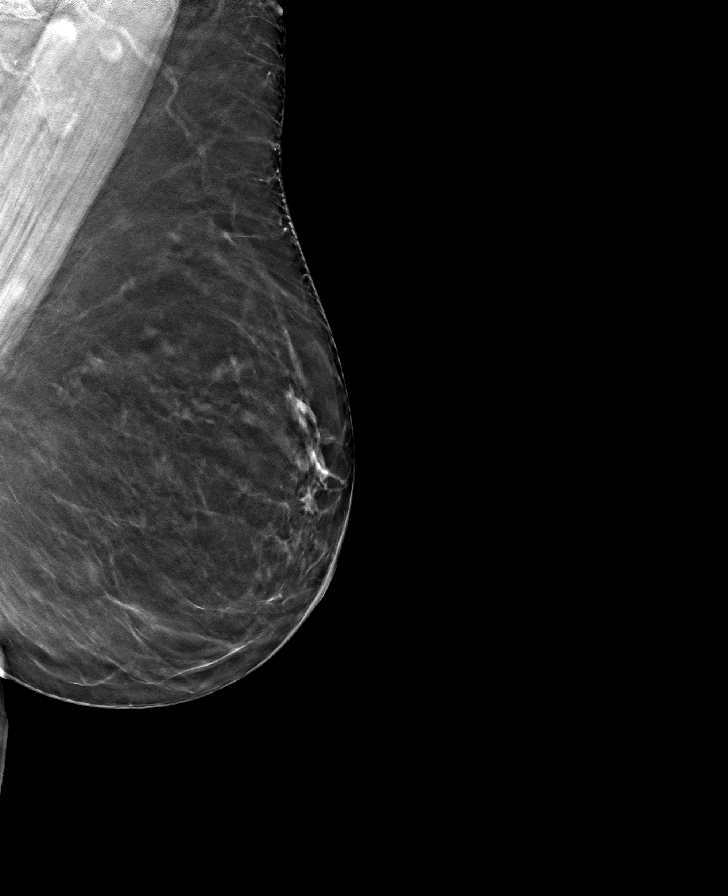

[R CC tomo · tomo slice 37/72.0]
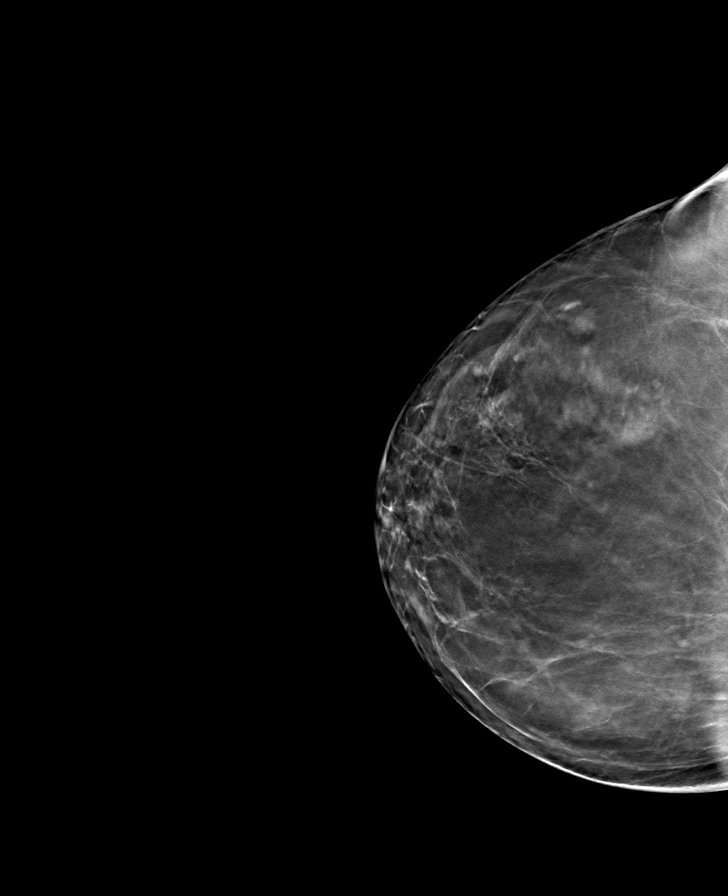

[R MLO tomo · tomo slice 39/78.0]
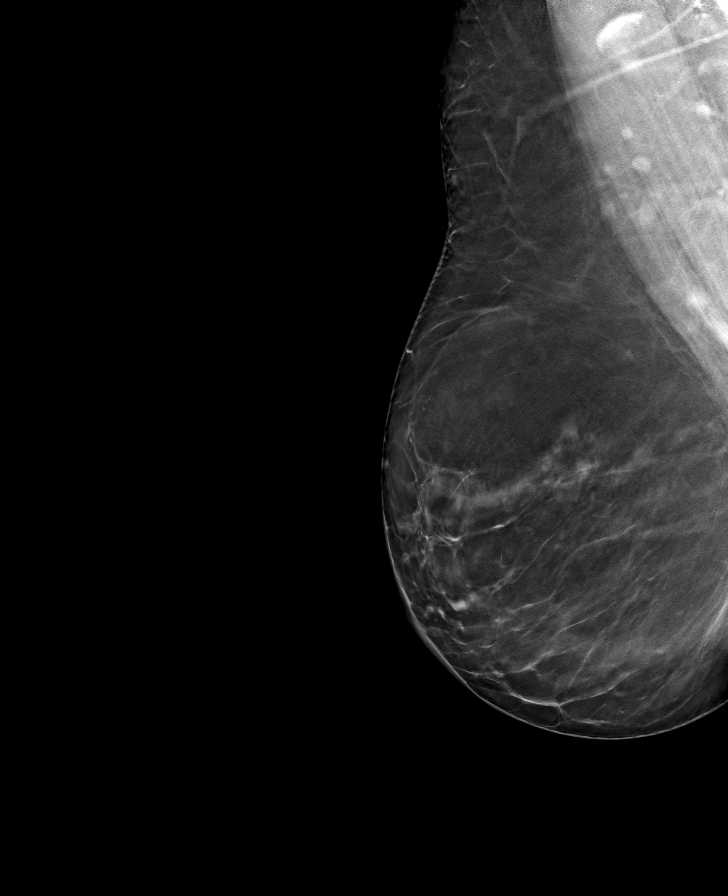

[L CC tomo · tomo slice 37/72.0]
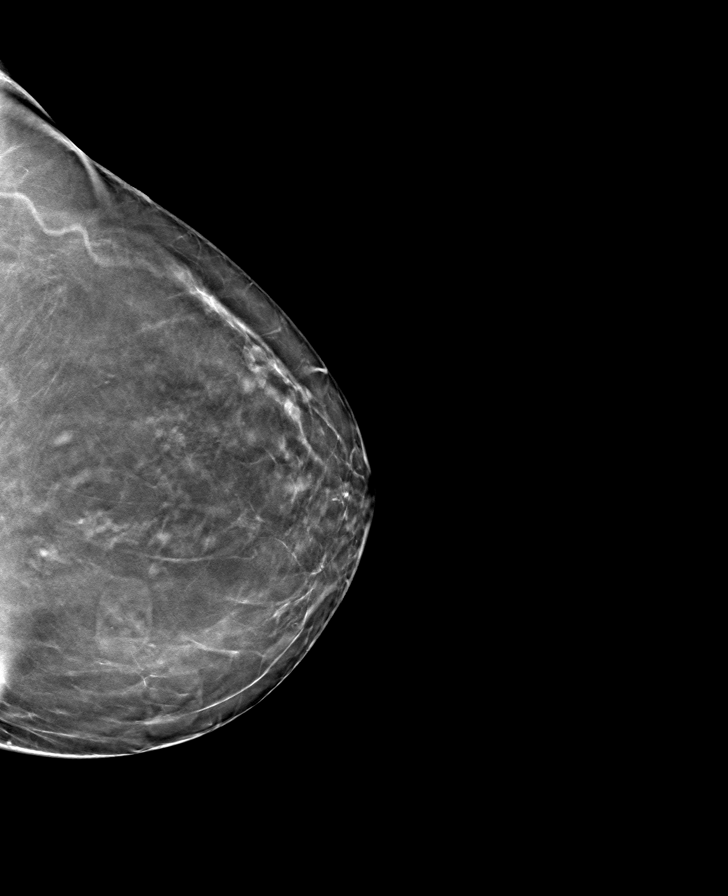

[8 of 24 positions shown; findings below may reference images not displayed]

ACR Breast Density Category b: There are scattered areas of
fibroglandular density.
FINDINGS: There are no findings suspicious for malignancy. Images were
processed with CAD.
IMPRESSION: No mammographic evidence of malignancy. A result letter of this
screening mammogram will be mailed directly to the patient.

RECOMMENDATION:
Screening mammogram in one year. (Code:[id number])

BI-RADS CATEGORY  1: Negative.

## 2020-02-21 ENCOUNTER — Telehealth: Payer: Self-pay | Admitting: Internal Medicine

## 2020-02-21 NOTE — Telephone Encounter (Signed)
    Patient wanting to discuss an at home hemoccult test. Patient declined appointment at this time. She has not verbalized any symptoms.

## 2020-02-24 NOTE — Telephone Encounter (Signed)
Pt called and not answer and no vm to lvm.

## 2020-04-17 ENCOUNTER — Other Ambulatory Visit: Payer: Self-pay | Admitting: Cardiology

## 2020-06-09 ENCOUNTER — Ambulatory Visit: Payer: Medicare Other | Admitting: Internal Medicine

## 2020-06-09 ENCOUNTER — Encounter: Payer: Self-pay | Admitting: Internal Medicine

## 2020-06-09 ENCOUNTER — Other Ambulatory Visit: Payer: Self-pay | Admitting: Internal Medicine

## 2020-06-09 ENCOUNTER — Other Ambulatory Visit: Payer: Self-pay

## 2020-06-09 VITALS — BP 140/68 | HR 72 | Temp 98.1°F | Ht 64.0 in | Wt 140.0 lb

## 2020-06-09 DIAGNOSIS — N1831 Chronic kidney disease, stage 3a: Secondary | ICD-10-CM

## 2020-06-09 DIAGNOSIS — E559 Vitamin D deficiency, unspecified: Secondary | ICD-10-CM | POA: Diagnosis not present

## 2020-06-09 DIAGNOSIS — E538 Deficiency of other specified B group vitamins: Secondary | ICD-10-CM | POA: Diagnosis not present

## 2020-06-09 DIAGNOSIS — E119 Type 2 diabetes mellitus without complications: Secondary | ICD-10-CM

## 2020-06-09 DIAGNOSIS — Z0000 Encounter for general adult medical examination without abnormal findings: Secondary | ICD-10-CM | POA: Diagnosis not present

## 2020-06-09 DIAGNOSIS — E785 Hyperlipidemia, unspecified: Secondary | ICD-10-CM | POA: Diagnosis not present

## 2020-06-09 DIAGNOSIS — I10 Essential (primary) hypertension: Secondary | ICD-10-CM | POA: Diagnosis not present

## 2020-06-09 DIAGNOSIS — N183 Chronic kidney disease, stage 3 unspecified: Secondary | ICD-10-CM | POA: Insufficient documentation

## 2020-06-09 LAB — CBC WITH DIFFERENTIAL/PLATELET
Basophils Absolute: 0 10*3/uL (ref 0.0–0.1)
Basophils Relative: 0.7 % (ref 0.0–3.0)
Eosinophils Absolute: 0.2 10*3/uL (ref 0.0–0.7)
Eosinophils Relative: 3.2 % (ref 0.0–5.0)
HCT: 42.2 % (ref 36.0–46.0)
Hemoglobin: 14.4 g/dL (ref 12.0–15.0)
Lymphocytes Relative: 20.7 % (ref 12.0–46.0)
Lymphs Abs: 1.2 10*3/uL (ref 0.7–4.0)
MCHC: 34 g/dL (ref 30.0–36.0)
MCV: 91.3 fl (ref 78.0–100.0)
Monocytes Absolute: 0.6 10*3/uL (ref 0.1–1.0)
Monocytes Relative: 10.2 % (ref 3.0–12.0)
Neutro Abs: 3.8 10*3/uL (ref 1.4–7.7)
Neutrophils Relative %: 65.2 % (ref 43.0–77.0)
Platelets: 125 10*3/uL — ABNORMAL LOW (ref 150.0–400.0)
RBC: 4.63 Mil/uL (ref 3.87–5.11)
RDW: 13.5 % (ref 11.5–15.5)
WBC: 5.8 10*3/uL (ref 4.0–10.5)

## 2020-06-09 LAB — BASIC METABOLIC PANEL
BUN: 24 mg/dL — ABNORMAL HIGH (ref 6–23)
CO2: 33 mEq/L — ABNORMAL HIGH (ref 19–32)
Calcium: 9.8 mg/dL (ref 8.4–10.5)
Chloride: 100 mEq/L (ref 96–112)
Creatinine, Ser: 1.18 mg/dL (ref 0.40–1.20)
GFR: 43.89 mL/min — ABNORMAL LOW (ref >60.00)
Glucose, Bld: 196 mg/dL — ABNORMAL HIGH (ref 70–99)
Potassium: 3.1 mEq/L — ABNORMAL LOW (ref 3.5–5.1)
Sodium: 139 mEq/L (ref 135–145)

## 2020-06-09 LAB — LIPID PANEL
Cholesterol: 134 mg/dL (ref 0–200)
HDL: 28.8 mg/dL — ABNORMAL LOW (ref >39.00)
NonHDL: 105.68
Total CHOL/HDL Ratio: 5
Triglycerides: 214 mg/dL — ABNORMAL HIGH (ref 0.0–149.0)
VLDL: 42.8 mg/dL — ABNORMAL HIGH (ref 0.0–40.0)

## 2020-06-09 LAB — URINALYSIS, ROUTINE W REFLEX MICROSCOPIC
Bilirubin Urine: NEGATIVE
Hgb urine dipstick: NEGATIVE
Ketones, ur: NEGATIVE
Leukocytes,Ua: NEGATIVE
Nitrite: NEGATIVE
RBC / HPF: NONE SEEN (ref 0–?)
Specific Gravity, Urine: 1.02 (ref 1.000–1.030)
Total Protein, Urine: NEGATIVE
Urine Glucose: 250 — AB
Urobilinogen, UA: 0.2 (ref 0.0–1.0)
pH: 6 (ref 5.0–8.0)

## 2020-06-09 LAB — HEPATIC FUNCTION PANEL
ALT: 19 U/L (ref 0–35)
AST: 16 U/L (ref 0–37)
Albumin: 4.4 g/dL (ref 3.5–5.2)
Alkaline Phosphatase: 51 U/L (ref 39–117)
Bilirubin, Direct: 0.1 mg/dL (ref 0.0–0.3)
Total Bilirubin: 0.6 mg/dL (ref 0.2–1.2)
Total Protein: 7.2 g/dL (ref 6.0–8.3)

## 2020-06-09 LAB — MICROALBUMIN / CREATININE URINE RATIO
Creatinine,U: 74 mg/dL
Microalb Creat Ratio: 1.7 mg/g (ref 0.0–30.0)
Microalb, Ur: 1.3 mg/dL (ref 0.0–1.9)

## 2020-06-09 LAB — VITAMIN D 25 HYDROXY (VIT D DEFICIENCY, FRACTURES): VITD: 51.75 ng/mL (ref 30.00–100.00)

## 2020-06-09 LAB — HEMOGLOBIN A1C: Hgb A1c MFr Bld: 8.2 % — ABNORMAL HIGH (ref 4.6–6.5)

## 2020-06-09 LAB — VITAMIN B12: Vitamin B-12: 276 pg/mL (ref 211–911)

## 2020-06-09 LAB — TSH: TSH: 3.4 u[IU]/mL (ref 0.35–4.50)

## 2020-06-09 LAB — LDL CHOLESTEROL, DIRECT: Direct LDL: 70 mg/dL

## 2020-06-09 MED ORDER — METFORMIN HCL ER 500 MG PO TB24: 500.0000 mg | ORAL_TABLET | Freq: Every day | ORAL | 3 refills | Status: DC

## 2020-06-09 MED ORDER — POTASSIUM CHLORIDE ER 10 MEQ PO TBCR: 10.0000 meq | EXTENDED_RELEASE_TABLET | Freq: Every day | ORAL | 0 refills | Status: DC

## 2020-06-09 NOTE — Assessment & Plan Note (Signed)
stable overall by history and exam, recent data reviewed with pt, and pt to continue medical treatment as before,  to f/u any worsening symptoms or concerns  

## 2020-06-09 NOTE — Assessment & Plan Note (Signed)

## 2020-06-09 NOTE — Patient Instructions (Signed)

## 2020-06-09 NOTE — Progress Notes (Signed)
Subjective:    Patient ID: Carol Lawrence, female    DOB: [birth year], 84 y.o.   MRN: [id number]  HPI  Here for wellness and f/u;  Overall doing ok;  Pt denies Chest pain, worsening SOB, DOE, wheezing, orthopnea, PND, worsening LE edema, palpitations, dizziness or syncope.  Pt denies neurological change such as new headache, facial or extremity weakness.  Pt denies polydipsia, polyuria, or low sugar symptoms. Pt states overall good compliance with treatment and medications, good tolerability, and has been trying to follow appropriate diet.  Pt denies worsening depressive symptoms, suicidal ideation or panic. No fever, night sweats, wt loss, loss of appetite, or other constitutional symptoms.  Pt states good ability with ADL's, has low fall risk, home safety reviewed and adequate, no other significant changes in hearing or vision, and only occasionally active with exercise  No new complaints Past Medical History:  Diagnosis Date  . Allergic rhinitis   . Bell's palsy   . Cancer ([place])    [phone]  . Cardiomyopathy, dilated ([place]) 08/26/2011   15% 2011, 55% 2012  . Carotid stenosis 09/12/2012   Bilateral, mild Lifeline screening  . CHF (congestive heart failure) ([place])   . Clotting disorder ([place])   . Colonic polyp   . DVT (deep venous thrombosis) ([place])   . Glucose intolerance (impaired glucose tolerance)   . HLD (hyperlipidemia)   . HTN (hypertension)   . NICM (nonischemic cardiomyopathy) ([place])    Coronaries; left main essentially was nonexistent or very short with  . PVD (peripheral vascular disease) ([place])    Past Surgical History:  Procedure Laterality Date  . COLONOSCOPY    . hysterectomy - unknown type    . LUMBAR FUSION  1983  . OOPHORECTOMY    . POLYPECTOMY    . TUBAL LIGATION      reports that she quit smoking about 39 years ago. She has never used smokeless tobacco. She reports previous drug use. She reports that she does not drink alcohol. family history includes Breast  cancer (age of onset: 41) in her daughter; COPD in her mother and another family member; Colon polyps in her brother and sister; Coronary artery disease in an other family member; Hyperlipidemia in an other family member; Leukemia in her sister, sister and another family member; Liver cancer in an other family member; Lung cancer in an other family member; Melanoma in an other family member; Osteoporosis in her mother and another family member. Allergies  Allergen Reactions  . Amoxicillin Itching    Curam---from [place]  . Codeine Itching   Current Outpatient Medications on File Prior to Visit  Medication Sig Dispense Refill  . aspirin 81 MG tablet Take 81 mg by mouth daily.      . carvedilol (COREG) 25 MG tablet Take 1 tablet (25 mg total) by mouth 2 (two) times daily. 180 tablet 3  . cholecalciferol (VITAMIN D3) 25 MCG (1000 UT) tablet Take 1,000 Units by mouth daily.    Marland Kitchen lisinopril (ZESTRIL) 20 MG tablet TAKE 1 TABLET BY MOUTH TWICE A DAY 180 tablet 1  . pravastatin (PRAVACHOL) 80 MG tablet Take 1 tablet (80 mg total) by mouth daily. 90 tablet 3  . chlorthalidone (HYGROTON) 25 MG tablet Take 1 tablet (25 mg total) by mouth daily. 90 tablet 3   No current facility-administered medications on file prior to visit.   Review of Systems All otherwise neg per pt    Objective:   Physical Exam BP 140/68 (BP Location:  Left Arm, Patient Position: Sitting, Cuff Size: Large)   Pulse 72   Temp 98.1 F (36.7 C) (Oral)   Ht 5\' 4"  (1.626 m)   Wt 140 lb (63.5 kg)   SpO2 97%   BMI 24.03 kg/m  VS noted,  Constitutional: Pt appears in NAD HENT: Head: NCAT.  Right Ear: External ear normal.  Left Ear: External ear normal.  Eyes: . Pupils are equal, round, and reactive to light. Conjunctivae and EOM are normal Nose: without d/c or deformity Neck: Neck supple. Gross normal ROM Cardiovascular: Normal rate and regular rhythm.   Pulmonary/Chest: Effort normal and breath sounds without rales or  wheezing.  Abd:  Soft, NT, ND, + BS, no organomegaly Neurological: Pt is alert. At baseline orientation, motor grossly intact Skin: Skin is warm. No rashes, other new lesions, no LE edema Psychiatric: Pt behavior is normal without agitation  All otherwise neg per pt Lab Results  Component Value Date   WBC 5.8 08/06/2019   HGB 16.2 (H) 08/06/2019   HCT 48.3 (H) 08/06/2019   PLT 135.0 (L) 08/06/2019   GLUCOSE 145 (H) 12/10/2019   CHOL 137 12/10/2019   TRIG 205.0 (H) 12/10/2019   HDL 33.20 (L) 12/10/2019   LDLDIRECT 76.0 12/10/2019   LDLCALC 65 08/06/2019   ALT 16 12/10/2019   AST 17 12/10/2019   NA 140 12/10/2019   K 3.5 12/10/2019   CL 102 12/10/2019   CREATININE 1.04 12/10/2019   BUN 20 12/10/2019   CO2 31 12/10/2019   TSH 3.91 08/06/2019   INR 1.0 ratio 03/25/2010   HGBA1C 6.9 (H) 12/10/2019       Assessment & Plan:

## 2020-07-14 ENCOUNTER — Other Ambulatory Visit: Payer: Self-pay | Admitting: Obstetrics and Gynecology

## 2020-07-14 DIAGNOSIS — Z1231 Encounter for screening mammogram for malignant neoplasm of breast: Secondary | ICD-10-CM

## 2020-08-03 ENCOUNTER — Other Ambulatory Visit: Payer: Self-pay | Admitting: Cardiology

## 2020-08-04 ENCOUNTER — Other Ambulatory Visit: Payer: Self-pay | Admitting: Cardiology

## 2020-09-07 ENCOUNTER — Ambulatory Visit: Payer: Medicare Other

## 2020-09-07 ENCOUNTER — Other Ambulatory Visit: Payer: Self-pay

## 2020-09-07 ENCOUNTER — Ambulatory Visit
Admission: RE | Admit: 2020-09-07 | Discharge: 2020-09-07 | Disposition: A | Discharge status: Home / Self Care | Payer: Medicare Other | Source: Ambulatory Visit | Attending: Obstetrics and Gynecology | Admitting: Obstetrics and Gynecology

## 2020-09-07 DIAGNOSIS — Z1231 Encounter for screening mammogram for malignant neoplasm of breast: Secondary | ICD-10-CM

## 2020-10-06 ENCOUNTER — Other Ambulatory Visit: Payer: Self-pay

## 2020-10-06 ENCOUNTER — Telehealth (INDEPENDENT_AMBULATORY_CARE_PROVIDER_SITE_OTHER): Payer: Medicare Other | Admitting: Family Medicine

## 2020-10-06 DIAGNOSIS — R059 Cough, unspecified: Secondary | ICD-10-CM

## 2020-10-06 DIAGNOSIS — R0981 Nasal congestion: Secondary | ICD-10-CM

## 2020-10-06 MED ORDER — BENZONATATE 100 MG PO CAPS
100.0000 mg | ORAL_CAPSULE | Freq: Three times a day (TID) | ORAL | 0 refills | Status: DC | PRN
Start: 2020-10-06 — End: 2021-06-11

## 2020-10-06 NOTE — Progress Notes (Signed)
Virtual Visit via Telephone Note  I connected with Carol Lawrence on 10/06/20 at 10:20 AM EDT by telephone and verified that I am speaking with the correct person using two identifiers.   I discussed the limitations, risks, security and privacy concerns of performing an evaluation and management service by telephone and the availability of in person appointments. I also discussed with the patient that there may be a patient responsible charge related to this service. The patient expressed understanding and agreed to proceed.  Location patient: home Location provider: work or home office Participants present for the call: patient, provider Patient did not have a visit in the prior 7 days to address this/these issue(s).   History of Present Illness:  Acute telemedicine visit for : -Onset: 4-5 days ago -Symptoms include: clear nasal congestion initially, cough, low grade fever initially, PND, sore throat, diarrhea mild initially -now improved some but still some congestion and some cough, energy is ok and doing her normal things -Denies: CP, SOB, vomiting, inability to get out of bed/eat/drink -no known sick contacts -Has tried: tylenol, nyquil -Pertinent past medical history: allergic rhinitis, heart dz, DM, HTN -Pertinent medication allergies: amoxicillin, codein -COVID-19 vaccine status: fully vaccinated + the booster 3 weeks ago, [place]   Observations/Objective: Patient sounds cheerful and well on the phone. I do not appreciate any SOB. Speech and thought processing are grossly intact. Patient reported vitals:  Assessment and Plan:  Sinus congestion  Cough  -we discussed possible serious and likely etiologies, options for evaluation and workup, limitations of telemedicine visit vs in person visit, treatment, treatment risks and precautions. Pt prefers to treat via telemedicine empirically rather than in person at this moment.  Suspect likely viral upper respiratory  infection.  Discussed possibility of breakthrough Covid, though seems less likely as she remains fairly isolated and is fully vaccinated with her booster as well.  Did discuss COVID-19 testing options and she does prefer to go ahead and get a test.  Discussed treatment with Tessalon for cough, Rx sent.  Nasal saline, symptomatic care with Tylenol if needed.  Discussed vitamins.  Discussed other potential etiologies.  No signs or symptoms to suggest a bacterial infection at this time.  Discussed risks/benefits of antibiotics as she wondered if this could help.  Opted to hold off on antibiotic at this time.  Did advised to schedule a follow-up visit if worsening, new symptoms or if her symptoms are not continuing to improve over the next several days.   Advised to seek prompt follow up telemedicine visit or in person care if worsening, new symptoms arise, or if is not improving with treatment. Did let the patient know that I only do telemedicine shifts for [facility] [place] [facility] on Tuesdays and Thursdays and advised a follow up visit with PCP or at an [facility] if has further questions or concerns.   Follow Up Instructions:  I did not refer this patient for an OV in the next 24 hours for this/these issue(s).  I discussed the assessment and treatment plan with the patient. The patient was provided an opportunity to ask questions and all were answered. The patient agreed with the plan and demonstrated an understanding of the instructions.   I provided 19 minutes on this encounter.   Lucretia Kern, DO

## 2020-10-06 NOTE — Patient Instructions (Signed)
-  stay home while sick, and if you have [place] please stay home for a full 10 days since the onset of symptoms PLUS one day of no fever and feeling better  -[place] COVID19 testing information: [email or web address] OR [id number]  -call number provided for outpatient COVID19 treatment center [phone]  -can use tylenol if needed for fevers, aches and pains per instructions  -can use nasal saline a few times per day if nasal congestion  -stay hydrated, drink plenty of fluids and eat small healthy meals - avoid dairy  -can take 1000 IU Vit D3 and Vit C 500mg  lozenges per instructions  -follow up with your doctor in 2-3 days unless improving and feeling better  I hope you are feeling better soon! Seek in-person care or a follow up telemedicine visit promptly if your symptoms worsen, new concerns arise or you are not improving as expected. Call 911 if severe symptoms.

## 2020-10-15 ENCOUNTER — Telehealth: Payer: Self-pay | Admitting: Internal Medicine

## 2020-10-15 DIAGNOSIS — R300 Dysuria: Secondary | ICD-10-CM

## 2020-10-15 NOTE — Telephone Encounter (Signed)
Patient calling and states she believes she has a UTI, took a AZO test from the drug store and it said she did so she was wondering if she could get a prescription, or does she need to come in and get a urianlysis. Patients# [phone]

## 2020-10-15 NOTE — Telephone Encounter (Signed)
Can take the azo pills that turns your urine orange for pain along with any prescribed medicine from Dr. Jenny Reichmann  If not there, please leave a message on her phone

## 2020-10-16 ENCOUNTER — Other Ambulatory Visit (INDEPENDENT_AMBULATORY_CARE_PROVIDER_SITE_OTHER): Payer: Medicare Other

## 2020-10-16 ENCOUNTER — Other Ambulatory Visit: Payer: Self-pay | Admitting: Internal Medicine

## 2020-10-16 DIAGNOSIS — R300 Dysuria: Secondary | ICD-10-CM

## 2020-10-16 LAB — URINALYSIS, ROUTINE W REFLEX MICROSCOPIC
Bilirubin Urine: NEGATIVE
Nitrite: POSITIVE — AB
Specific Gravity, Urine: 1.02 (ref 1.000–1.030)
Total Protein, Urine: 100 — AB
Urine Glucose: NEGATIVE
Urobilinogen, UA: 2 — AB (ref 0.0–1.0)
pH: 6 (ref 5.0–8.0)

## 2020-10-16 MED ORDER — CIPROFLOXACIN HCL 500 MG PO TABS
500.0000 mg | ORAL_TABLET | Freq: Two times a day (BID) | ORAL | 0 refills | Status: AC
Start: 2020-10-16 — End: 2020-10-26

## 2020-10-16 NOTE — Telephone Encounter (Signed)
[place] for urine studies - order done

## 2020-10-16 NOTE — Telephone Encounter (Signed)
Sent to Dr. John. 

## 2020-10-16 NOTE — Telephone Encounter (Signed)
Follow up message  Patient has pain urination and frequent urination. Patient states she did a home test, that showed positive for Leukocytes, negative for Nitrite She is requesting antibiotic  Pharmacy [id number] - [place], [place].

## 2020-10-18 ENCOUNTER — Encounter: Payer: Self-pay | Admitting: Internal Medicine

## 2020-10-18 LAB — URINE CULTURE
MICRO NUMBER:: 11136765
SPECIMEN QUALITY:: ADEQUATE

## 2020-12-10 ENCOUNTER — Encounter: Payer: Medicare Other | Admitting: Internal Medicine

## 2020-12-10 ENCOUNTER — Other Ambulatory Visit (INDEPENDENT_AMBULATORY_CARE_PROVIDER_SITE_OTHER): Payer: Medicare Other

## 2020-12-10 DIAGNOSIS — E119 Type 2 diabetes mellitus without complications: Secondary | ICD-10-CM

## 2020-12-10 LAB — HEPATIC FUNCTION PANEL
ALT: 16 U/L (ref 0–35)
AST: 18 U/L (ref 0–37)
Albumin: 4.2 g/dL (ref 3.5–5.2)
Alkaline Phosphatase: 44 U/L (ref 39–117)
Bilirubin, Direct: 0.1 mg/dL (ref 0.0–0.3)
Total Bilirubin: 0.6 mg/dL (ref 0.2–1.2)
Total Protein: 7.1 g/dL (ref 6.0–8.3)

## 2020-12-10 LAB — BASIC METABOLIC PANEL
BUN: 23 mg/dL (ref 6–23)
CO2: 31 mEq/L (ref 19–32)
Calcium: 9.7 mg/dL (ref 8.4–10.5)
Chloride: 101 mEq/L (ref 96–112)
Creatinine, Ser: 1.28 mg/dL — ABNORMAL HIGH (ref 0.40–1.20)
GFR: 39.1 mL/min — ABNORMAL LOW (ref >60.00)
Glucose, Bld: 144 mg/dL — ABNORMAL HIGH (ref 70–99)
Potassium: 3.1 mEq/L — ABNORMAL LOW (ref 3.5–5.1)
Sodium: 139 mEq/L (ref 135–145)

## 2020-12-10 LAB — LIPID PANEL
Cholesterol: 113 mg/dL (ref 0–200)
HDL: 32 mg/dL — ABNORMAL LOW (ref >39.00)
LDL Cholesterol: 50 mg/dL (ref 0–99)
NonHDL: 81.17
Total CHOL/HDL Ratio: 4
Triglycerides: 157 mg/dL — ABNORMAL HIGH (ref 0.0–149.0)
VLDL: 31.4 mg/dL (ref 0.0–40.0)

## 2020-12-10 LAB — HEMOGLOBIN A1C: Hgb A1c MFr Bld: 6.7 % — ABNORMAL HIGH (ref 4.6–6.5)

## 2020-12-22 ENCOUNTER — Encounter: Payer: Self-pay | Admitting: Internal Medicine

## 2020-12-22 ENCOUNTER — Ambulatory Visit (INDEPENDENT_AMBULATORY_CARE_PROVIDER_SITE_OTHER): Payer: Medicare Other | Admitting: Internal Medicine

## 2020-12-22 ENCOUNTER — Other Ambulatory Visit: Payer: Self-pay

## 2020-12-22 VITALS — BP 118/66 | HR 54 | Temp 98.2°F | Ht 64.5 in | Wt 134.6 lb

## 2020-12-22 DIAGNOSIS — E876 Hypokalemia: Secondary | ICD-10-CM | POA: Diagnosis not present

## 2020-12-22 DIAGNOSIS — Z0000 Encounter for general adult medical examination without abnormal findings: Secondary | ICD-10-CM

## 2020-12-22 DIAGNOSIS — I10 Essential (primary) hypertension: Secondary | ICD-10-CM | POA: Diagnosis not present

## 2020-12-22 DIAGNOSIS — E1165 Type 2 diabetes mellitus with hyperglycemia: Secondary | ICD-10-CM | POA: Diagnosis not present

## 2020-12-22 DIAGNOSIS — N1831 Chronic kidney disease, stage 3a: Secondary | ICD-10-CM

## 2020-12-22 DIAGNOSIS — Z0001 Encounter for general adult medical examination with abnormal findings: Secondary | ICD-10-CM

## 2020-12-22 DIAGNOSIS — R202 Paresthesia of skin: Secondary | ICD-10-CM | POA: Diagnosis not present

## 2020-12-22 DIAGNOSIS — G629 Polyneuropathy, unspecified: Secondary | ICD-10-CM | POA: Insufficient documentation

## 2020-12-22 DIAGNOSIS — D696 Thrombocytopenia, unspecified: Secondary | ICD-10-CM

## 2020-12-22 DIAGNOSIS — E7849 Other hyperlipidemia: Secondary | ICD-10-CM | POA: Diagnosis not present

## 2020-12-22 DIAGNOSIS — E559 Vitamin D deficiency, unspecified: Secondary | ICD-10-CM | POA: Diagnosis not present

## 2020-12-22 LAB — HEPATIC FUNCTION PANEL
ALT: 18 U/L (ref 0–35)
AST: 19 U/L (ref 0–37)
Albumin: 4.8 g/dL (ref 3.5–5.2)
Alkaline Phosphatase: 46 U/L (ref 39–117)
Bilirubin, Direct: 0.1 mg/dL (ref 0.0–0.3)
Total Bilirubin: 0.5 mg/dL (ref 0.2–1.2)
Total Protein: 7.8 g/dL (ref 6.0–8.3)

## 2020-12-22 LAB — CBC WITH DIFFERENTIAL/PLATELET
Basophils Absolute: 0 10*3/uL (ref 0.0–0.1)
Basophils Relative: 0.6 % (ref 0.0–3.0)
Eosinophils Absolute: 0.2 10*3/uL (ref 0.0–0.7)
Eosinophils Relative: 3 % (ref 0.0–5.0)
HCT: 45.5 % (ref 36.0–46.0)
Hemoglobin: 15.4 g/dL — ABNORMAL HIGH (ref 12.0–15.0)
Lymphocytes Relative: 23.4 % (ref 12.0–46.0)
Lymphs Abs: 1.5 10*3/uL (ref 0.7–4.0)
MCHC: 33.8 g/dL (ref 30.0–36.0)
MCV: 89.3 fl (ref 78.0–100.0)
Monocytes Absolute: 0.4 10*3/uL (ref 0.1–1.0)
Monocytes Relative: 7 % (ref 3.0–12.0)
Neutro Abs: 4.1 10*3/uL (ref 1.4–7.7)
Neutrophils Relative %: 66 % (ref 43.0–77.0)
Platelets: 145 10*3/uL — ABNORMAL LOW (ref 150.0–400.0)
RBC: 5.1 Mil/uL (ref 3.87–5.11)
RDW: 13.6 % (ref 11.5–15.5)
WBC: 6.2 10*3/uL (ref 4.0–10.5)

## 2020-12-22 LAB — URINALYSIS, ROUTINE W REFLEX MICROSCOPIC
Bilirubin Urine: NEGATIVE
Hgb urine dipstick: NEGATIVE
Ketones, ur: NEGATIVE
Leukocytes,Ua: NEGATIVE
Nitrite: NEGATIVE
Specific Gravity, Urine: 1.025 (ref 1.000–1.030)
Total Protein, Urine: NEGATIVE
Urine Glucose: NEGATIVE
Urobilinogen, UA: 0.2 (ref 0.0–1.0)
pH: 6 (ref 5.0–8.0)

## 2020-12-22 LAB — LDL CHOLESTEROL, DIRECT: Direct LDL: 83 mg/dL

## 2020-12-22 LAB — LIPID PANEL
Cholesterol: 157 mg/dL (ref 0–200)
HDL: 38.3 mg/dL — ABNORMAL LOW (ref >39.00)
NonHDL: 119.06
Total CHOL/HDL Ratio: 4
Triglycerides: 245 mg/dL — ABNORMAL HIGH (ref 0.0–149.0)
VLDL: 49 mg/dL — ABNORMAL HIGH (ref 0.0–40.0)

## 2020-12-22 LAB — VITAMIN B12: Vitamin B-12: 273 pg/mL (ref 211–911)

## 2020-12-22 LAB — BASIC METABOLIC PANEL
BUN: 25 mg/dL — ABNORMAL HIGH (ref 6–23)
CO2: 32 mEq/L (ref 19–32)
Calcium: 10.6 mg/dL — ABNORMAL HIGH (ref 8.4–10.5)
Chloride: 102 mEq/L (ref 96–112)
Creatinine, Ser: 1.17 mg/dL (ref 0.40–1.20)
GFR: 43.54 mL/min — ABNORMAL LOW (ref >60.00)
Glucose, Bld: 110 mg/dL — ABNORMAL HIGH (ref 70–99)
Potassium: 3.7 mEq/L (ref 3.5–5.1)
Sodium: 141 mEq/L (ref 135–145)

## 2020-12-22 LAB — MICROALBUMIN / CREATININE URINE RATIO
Creatinine,U: 75.1 mg/dL
Microalb Creat Ratio: 3.8 mg/g (ref 0.0–30.0)
Microalb, Ur: 2.9 mg/dL — ABNORMAL HIGH (ref 0.0–1.9)

## 2020-12-22 LAB — TSH: TSH: 3.47 u[IU]/mL (ref 0.35–4.50)

## 2020-12-22 LAB — PHOSPHORUS: Phosphorus: 4.6 mg/dL (ref 2.3–4.6)

## 2020-12-22 LAB — VITAMIN D 25 HYDROXY (VIT D DEFICIENCY, FRACTURES): VITD: 51.76 ng/mL (ref 30.00–100.00)

## 2020-12-22 LAB — HEMOGLOBIN A1C: Hgb A1c MFr Bld: 6.6 % — ABNORMAL HIGH (ref 4.6–6.5)

## 2020-12-22 LAB — MAGNESIUM: Magnesium: 2.2 mg/dL (ref 1.5–2.5)

## 2020-12-22 MED ORDER — POTASSIUM CHLORIDE ER 10 MEQ PO TBCR: 10.0000 meq | EXTENDED_RELEASE_TABLET | Freq: Every day | ORAL | 3 refills | Status: DC

## 2020-12-22 NOTE — Assessment & Plan Note (Signed)

## 2020-12-22 NOTE — Progress Notes (Signed)
Established Patient Office Visit  Subjective:  Patient ID: Carol Lawrence, female    DOB: Jul 13, 1936  Age: 85 y.o. MRN: [id number]       Chief Complaint:  Annual Exam (CPE/labs.  Wants to discuss labs no other concerns.  ) and Dm, bilateral toe numbness x 3 mo, CKD, TCP, hypokalemia, and low vit d, hld, htn, dm       HPI:  Carol Lawrence is a 85 y.o. female here  C/o 3 mo non painful but worsening toe numbness new for her, without lbp or other leg pain or weakness. Last B12 approx 1 yr ago normal  No other overt bleeding or leg cramping. Tolerating vit d.  Wondering if she needs K replacement on regular basis given last K 3.1 low twice in the past month on chlorthalidone Also, for wellness overall doing ok;  Pt denies Chest pain, worsening SOB, DOE, wheezing, orthopnea, PND, worsening LE edema, palpitations, dizziness or syncope. .  Pt denies polydipsia, polyuria. Pt states overall good compliance with treatment and medications, good medication tolerability, and has been trying to follow appropriate diet.  Pt denies worsening depressive symptoms, suicidal ideation or panic. Denies fever, night sweats, loss of appetite, or other constitutional symptoms.  Pt states good ability with ADL's, has low fall risk, home safety reviewed and adequate, no other significant changes in hearing or vision, and not active with exercise.      Has lost wt from last visit, while on new metformin. Wt Readings from Last 3 Encounters:  12/22/20 134 lb 9.6 oz (61.1 kg)  06/09/20 140 lb (63.5 kg)  01/02/20 138 lb (62.6 kg)   BP Readings from Last 3 Encounters:  12/22/20 118/66  06/09/20 140/68  01/02/20 (!) 159/65    Past Medical History:  Diagnosis Date  . Allergic rhinitis   . Bell's palsy   . Cancer ([place])    [phone]  . Cardiomyopathy, dilated ([place]) 08/26/2011   15% 2011, 55% 2012  . Carotid stenosis 09/12/2012   Bilateral, mild Lifeline screening  . CHF (congestive heart failure) ([place])   .  Clotting disorder ([place])   . Colonic polyp   . DVT (deep venous thrombosis) ([place])   . Glucose intolerance (impaired glucose tolerance)   . HLD (hyperlipidemia)   . HTN (hypertension)   . NICM (nonischemic cardiomyopathy) ([place])    Coronaries; left main essentially was nonexistent or very short with  . PVD (peripheral vascular disease) ([place])    Past Surgical History:  Procedure Laterality Date  . COLONOSCOPY    . hysterectomy - unknown type    . LUMBAR FUSION  1983  . OOPHORECTOMY    . POLYPECTOMY    . TUBAL LIGATION      reports that she quit smoking about 40 years ago. She has never used smokeless tobacco. She reports previous drug use. She reports that she does not drink alcohol. family history includes Breast cancer (age of onset: 42) in her daughter; COPD in her mother and another family member; Colon polyps in her brother and sister; Coronary artery disease in an other family member; Hyperlipidemia in an other family member; Leukemia in her sister, sister and another family member; Liver cancer in an other family member; Lung cancer in an other family member; Melanoma in an other family member; Osteoporosis in her mother and another family member. Allergies  Allergen Reactions  . Amoxicillin Itching    Curam---from [place]  . Codeine Itching   Current Outpatient Medications  on File Prior to Visit  Medication Sig Dispense Refill  . aspirin 81 MG tablet Take 81 mg by mouth daily.    . carvedilol (COREG) 25 MG tablet TAKE 1 TABLET BY MOUTH TWICE A DAY 180 tablet 2  . chlorthalidone (HYGROTON) 25 MG tablet Take 1 tablet (25 mg total) by mouth daily. 90 tablet 3  . cholecalciferol (VITAMIN D3) 25 MCG (1000 UT) tablet Take 1,000 Units by mouth daily.    Marland Kitchen lisinopril (ZESTRIL) 20 MG tablet TAKE 1 TABLET BY MOUTH TWICE A DAY 180 tablet 1  . metFORMIN (GLUCOPHAGE-XR) 500 MG 24 hr tablet Take 1 tablet (500 mg total) by mouth daily with breakfast. 90 tablet 3  . pravastatin (PRAVACHOL) 80  MG tablet Take 1 tablet (80 mg total) by mouth daily. 90 tablet 3  . benzonatate (TESSALON PERLES) 100 MG capsule Take 1 capsule (100 mg total) by mouth 3 (three) times daily as needed. (Patient not taking: Reported on 12/22/2020) 20 capsule 0   No current facility-administered medications on file prior to visit.        ROS:  All others reviewed and negative.  Objective        PE:  BP 118/66   Pulse (!) 54   Temp 98.2 F (36.8 C) (Oral)   Ht 5' 4.5" (1.638 m)   Wt 134 lb 9.6 oz (61.1 kg)   SpO2 98%   BMI 22.75 kg/m                 Constitutional: Pt appears in NAD               HENT: Head: NCAT.                Right Ear: External ear normal.                 Left Ear: External ear normal.                Eyes: . Pupils are equal, round, and reactive to light. Conjunctivae and EOM are normal               Nose: without d/c or deformity               Neck: Neck supple. Gross normal ROM               Cardiovascular: Normal rate and regular rhythm.                 Pulmonary/Chest: Effort normal and breath sounds without rales or wheezing.                Abd:  Soft, NT, ND, + BS, no organomegaly               Neurological: Pt is alert. At baseline orientation, motor grossly intact               Skin: Skin is warm. No rashes, no other new lesions, LE edema - none               Psychiatric: Pt behavior is normal without agitation   Assessment/Plan:  Carol Lawrence is a 85 y.o. White or Caucasian [1] female with  has a past medical history of Allergic rhinitis, Bell's palsy, Cancer ([place]), Cardiomyopathy, dilated ([place]) (08/26/2011), Carotid stenosis (09/12/2012), CHF (congestive heart failure) ([place]), Clotting disorder ([place]), Colonic polyp, DVT (deep venous thrombosis) ([facility]), Glucose intolerance (impaired glucose tolerance), HLD (hyperlipidemia), HTN (hypertension), NICM (  nonischemic cardiomyopathy) ([place]), and PVD (peripheral vascular disease) ([place]).  Assessment Plan  See notes Labs reviewed  for each problem: Lab Results  Component Value Date   WBC 6.2 12/22/2020   HGB 15.4 (H) 12/22/2020   HCT 45.5 12/22/2020   PLT 145.0 (L) 12/22/2020   GLUCOSE 110 (H) 12/22/2020   CHOL 157 12/22/2020   TRIG 245.0 (H) 12/22/2020   HDL 38.30 (L) 12/22/2020   LDLDIRECT 83.0 12/22/2020   LDLCALC 50 12/10/2020   ALT 18 12/22/2020   AST 19 12/22/2020   NA 141 12/22/2020   K 3.7 12/22/2020   CL 102 12/22/2020   CREATININE 1.17 12/22/2020   BUN 25 (H) 12/22/2020   CO2 32 12/22/2020   TSH 3.47 12/22/2020   INR 1.0 ratio 03/25/2010   HGBA1C 6.6 (H) 12/22/2020   MICROALBUR 2.9 (H) 12/22/2020    Micro: none  Cardiac tracings I have personally interpreted today:  none  Pertinent Radiological findings (summarize): none   I spent total 35 minutes in addition to wellness in caring for the patient for this visit:  1) by communicating with the patient and family/caregiver - husband, during the visit  2) by review of pertinent vital sign data, physical examination and labs as documented in the assessment and plan  3) by review of pertinent imaging - none today  4) by review of pertinent procedures - none today  5) by obtaining and reviewing separately obtained information from family/caretaker and Care Everywhere - none today  6) by ordering medications  7) by ordering tests  8) by documenting all of this clinical information in the EHR including the management of each problem noted today in assessment and plan   There are no preventive care reminders to display for this patient.  There are no preventive care reminders to display for this patient.  Lab Results  Component Value Date   TSH 3.47 12/22/2020   Lab Results  Component Value Date   WBC 6.2 12/22/2020   HGB 15.4 (H) 12/22/2020   HCT 45.5 12/22/2020   MCV 89.3 12/22/2020   PLT 145.0 (L) 12/22/2020   Lab Results  Component Value Date   NA 141 12/22/2020   K 3.7 12/22/2020   CHLORIDE 107 01/06/2016   CO2 32  12/22/2020   GLUCOSE 110 (H) 12/22/2020   BUN 25 (H) 12/22/2020   CREATININE 1.17 12/22/2020   BILITOT 0.5 12/22/2020   ALKPHOS 46 12/22/2020   AST 19 12/22/2020   ALT 18 12/22/2020   PROT 7.8 12/22/2020   ALBUMIN 4.8 12/22/2020   CALCIUM 10.6 (H) 12/22/2020   ANIONGAP 6 07/10/2017   EGFR 54 (L) 01/06/2016   GFR 43.54 (L) 12/22/2020   Lab Results  Component Value Date   CHOL 157 12/22/2020   Lab Results  Component Value Date   HDL 38.30 (L) 12/22/2020   Lab Results  Component Value Date   LDLCALC 50 12/10/2020   Lab Results  Component Value Date   TRIG 245.0 (H) 12/22/2020   Lab Results  Component Value Date   CHOLHDL 4 12/22/2020   Lab Results  Component Value Date   HGBA1C 6.6 (H) 12/22/2020      Assessment & Plan:   Problem List Items Addressed This Visit      Medium   Vitamin D deficiency    Cont oral replacement      Relevant Orders   VITAMIN D 25 Hydroxy (Vit-D Deficiency, Fractures) (Completed)   Thrombocytopenia ([place])  Chronic mild stable, no overt bleeding, continue to monitor      Paresthesia of both feet    Lab Results  Component Value Date   VITAMINB12 273 12/22/2020  b12 normal, but suspect new mild distal toe/feet neuropathy likely age related, cont to monitor      Relevant Orders   Vitamin B12 (Completed)   Hypokalemia    Recent mild persistent low despite 3 days K replacement once, will add K daily supplement      Relevant Orders   Magnesium (Completed)   HLD (hyperlipidemia)    Lab Results  Component Value Date   LDLCALC 50 12/10/2020   Stable, cont pravacholl      Essential hypertension    BP Readings from Last 3 Encounters:  12/22/20 118/66  06/09/20 140/68  01/02/20 (!) 159/65  stable, cont lisinopril      Diabetes ([place])    Lab Results  Component Value Date   HGBA1C 6.6 (H) 12/22/2020  stable, consider d/c metformin for overcontroll or worsening renal fxn      Relevant Orders   Microalbumin /  creatinine urine ratio (Completed)   Hemoglobin A1c (Completed)   Lipid panel (Completed)   Hepatic function panel (Completed)   CBC with Differential/Platelet (Completed)   TSH (Completed)   Urinalysis, Routine w reflex microscopic (Completed)   Basic metabolic panel (Completed)   CKD (chronic kidney disease) stage 3, GFR 30-59 ml/min (HCC)    Chronic stable  Lab Results  Component Value Date   CREATININE 1.17 12/22/2020  consider d/c ace or renal referral      Relevant Orders   PTH, intact and calcium   Phosphorus (Completed)     Unprioritized   Encounter for well adult exam with abnormal findings - Primary    Overall doing well, age appropriate education and counseling updated, referrals for preventative services and immunizations addressed, dietary and smoking counseling addressed, most recent labs reviewed.  I have personally reviewed and have noted:  1) the patient's medical and social history 2) The pt's use of alcohol, tobacco, and illicit drugs 3) The patient's current medications and supplements 4) Functional ability including ADL's, fall risk, home safety risk, hearing and visual impairment 5) Diet and physical activities 6) Evidence for depression or mood disorder 7) The patient's height, weight, and BMI have been recorded in the chart  I have made referrals, and provided counseling and education based on review of the above          Meds ordered this encounter  Medications  . potassium chloride (KLOR-CON 10) 10 MEQ tablet    Sig: Take 1 tablet (10 mEq total) by mouth daily for 3 days.    Dispense:  90 tablet    Refill:  3    Follow-up: Return in about 6 months (around 06/21/2021).    Cathlean Cower, MD 12/22/2020 1:27 PM [facility]

## 2020-12-22 NOTE — Assessment & Plan Note (Signed)
Chronic mild stable, no overt bleeding, continue to monitor

## 2020-12-22 NOTE — Assessment & Plan Note (Signed)
Lab Results  Component Value Date   LDLCALC 50 12/10/2020   Stable, cont pravacholl

## 2020-12-22 NOTE — Assessment & Plan Note (Signed)
Chronic stable  Lab Results  Component Value Date   CREATININE 1.17 12/22/2020  consider d/c ace or renal referral

## 2020-12-22 NOTE — Assessment & Plan Note (Signed)
Lab Results  Component Value Date   VITAMINB12 273 12/22/2020  b12 normal, but suspect new mild distal toe/feet neuropathy likely age related, cont to monitor

## 2020-12-22 NOTE — Assessment & Plan Note (Signed)
BP Readings from Last 3 Encounters:  12/22/20 118/66  06/09/20 140/68  01/02/20 (!) 159/65  stable, cont lisinopril

## 2020-12-22 NOTE — Patient Instructions (Addendum)
Please see you eye doctor once yearly  Please take all new medication as prescribed  - the potassium pill daily  Please continue all other medications as before, and refills have been done if requested.  Please have the pharmacy call with any other refills you may need.  Please continue your efforts at being more active, low cholesterol diet, and weight control.  You are otherwise up to date with prevention measures today.  Please keep your appointments with your specialists as you may have planned  Please go to the LAB at the blood drawing area for the tests to be done  You will be contacted by phone if any changes need to be made immediately.  Otherwise, you will receive a letter about your results with an explanation, but please check with MyChart first.  Please remember to sign up for MyChart if you have not done so, as this will be important to you in the future with finding out test results, communicating by private email, and scheduling acute appointments online when needed.  Please make an Appointment to return in 6 months, or sooner if needed

## 2020-12-22 NOTE — Assessment & Plan Note (Signed)
Recent mild persistent low despite 3 days K replacement once, will add K daily supplement

## 2020-12-22 NOTE — Assessment & Plan Note (Signed)
Cont oral replacement 

## 2020-12-22 NOTE — Assessment & Plan Note (Signed)
Lab Results  Component Value Date   HGBA1C 6.6 (H) 12/22/2020  stable, consider d/c metformin for overcontroll or worsening renal fxn

## 2020-12-24 ENCOUNTER — Telehealth: Payer: Self-pay | Admitting: Internal Medicine

## 2020-12-24 LAB — PTH, INTACT AND CALCIUM
Calcium: 10.4 mg/dL (ref 8.6–10.4)
PTH: 18 pg/mL (ref 14–64)

## 2020-12-24 NOTE — Telephone Encounter (Signed)
potassium chloride (KLOR-CON 10) 10 MEQ tablet Patient calling stating Dr. Jonny Ruiz sent in this for her yesterday and previously when she took it, it was only one pill a day for three days. When she went to pick it up there was a 90 day supply but still with the same instructions so she wanted to clarify what Dr. Jonny Ruiz wanted her to do.

## 2020-12-24 NOTE — Telephone Encounter (Signed)
Sent to Dr. John to advise. 

## 2020-12-24 NOTE — Telephone Encounter (Signed)
I am confused since the emr only shows one rx for 3 days of potassium which is what was needed  I dont see any potassium rx for 90 days that has ever been sent  Perhaps she is confusing 90 days of potassium with another rx?

## 2020-12-25 NOTE — Telephone Encounter (Signed)
Pharmacist has been informed

## 2020-12-25 NOTE — Addendum Note (Signed)
Addended by: Biagio Borg on: 12/25/2020 01:17 PM   Modules accepted: Orders

## 2020-12-25 NOTE — Telephone Encounter (Signed)
Ok to let pharmacy know we can d/c the potassium 90 day rx

## 2020-12-25 NOTE — Telephone Encounter (Signed)
Follow up message  Patient requesting call be made to pharmacy to correct potassium chloride (KLOR-CON 10) 10 MEQ tablet 90 tablet 3

## 2020-12-25 NOTE — Telephone Encounter (Signed)
Sent to Dr. John. 

## 2020-12-29 ENCOUNTER — Telehealth: Payer: Self-pay | Admitting: Internal Medicine

## 2020-12-29 NOTE — Telephone Encounter (Signed)
Patient notified VIA phone and no further questions. Dm/cma

## 2020-12-29 NOTE — Telephone Encounter (Signed)
Please call to discuss lab results 

## 2021-01-04 ENCOUNTER — Other Ambulatory Visit: Payer: Self-pay | Admitting: Cardiology

## 2021-01-06 DIAGNOSIS — R001 Bradycardia, unspecified: Secondary | ICD-10-CM | POA: Insufficient documentation

## 2021-01-06 NOTE — Progress Notes (Signed)
Cardiology Office Note   Date:  01/07/2021   ID:  Carol Lawrence, DOB 09-26-1936, MRN [id number]  PCP:  Biagio Borg, MD  Cardiologist:   Minus Breeding, MD   Chief Complaint  Patient presents with  . Cardiomyopathy      History of Present Illness: Carol Lawrence is a 85 y.o. female who presents for followup of her cardiomyopathy. Her EF improved from a low of 15% to 55%.  However, her EF was slightly lower at 45% in 2016 on follow up echo. This was unchanged in 2020 and she had mild/mod MR.   Since I last saw her she has done well.  She pushes a mower.  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  She denies any cardiovascular symptoms.  She has had no chest pressure, neck or arm discomfort.  She has had no shortness of breath, PND or orthopnea.  She does not feel any palpitations, presyncope or syncope.   Past Medical History:  Diagnosis Date  . Allergic rhinitis   . Bell's palsy   . Cancer ([place])    [phone]  . Cardiomyopathy, dilated ([place]) 08/26/2011   15% 2011, 55% 2012  . Carotid stenosis 09/12/2012   Bilateral, mild Lifeline screening  . CHF (congestive heart failure) ([place])   . Clotting disorder ([place])   . Colonic polyp   . DVT (deep venous thrombosis) ([place])   . Glucose intolerance (impaired glucose tolerance)   . HLD (hyperlipidemia)   . HTN (hypertension)   . NICM (nonischemic cardiomyopathy) ([place])    Coronaries; left main essentially was nonexistent or very short with  . PVD (peripheral vascular disease) ([place])     Past Surgical History:  Procedure Laterality Date  . COLONOSCOPY    . hysterectomy - unknown type    . LUMBAR FUSION  1983  . OOPHORECTOMY    . POLYPECTOMY    . TUBAL LIGATION       Current Outpatient Medications  Medication Sig Dispense Refill  . aspirin 81 MG tablet Take 81 mg by mouth daily.    . benzonatate (TESSALON PERLES) 100 MG capsule Take 1 capsule (100 mg total) by mouth 3 (three)  times daily as needed. 20 capsule 0  . carvedilol (COREG) 25 MG tablet TAKE 1 TABLET BY MOUTH TWICE A DAY 180 tablet 2  . chlorthalidone (HYGROTON) 25 MG tablet TAKE 1 TABLET BY MOUTH EVERY DAY 90 tablet 3  . cholecalciferol (VITAMIN D3) 25 MCG (1000 UT) tablet Take 1,000 Units by mouth daily.    Marland Kitchen lisinopril (ZESTRIL) 20 MG tablet TAKE 1 TABLET BY MOUTH TWICE A DAY 180 tablet 1  . metFORMIN (GLUCOPHAGE-XR) 500 MG 24 hr tablet Take 1 tablet (500 mg total) by mouth daily with breakfast. 90 tablet 3  . pravastatin (PRAVACHOL) 80 MG tablet Take 1 tablet (80 mg total) by mouth daily. 90 tablet 3   No current facility-administered medications for this visit.    Allergies:   Amoxicillin and Codeine   ROS:  Please see the history of present illness.   Otherwise, review of systems are positive for burning in her toes.   All other systems are reviewed and negative.    PHYSICAL EXAM: VS:  BP 122/62   Pulse (!) 51   Ht 5\' 4"  (1.626 m)   Wt 134 lb (60.8 kg)   SpO2 98%   BMI 23.00 kg/m  , BMI Body mass index is  23 kg/m.  GENERAL:  Well appearing NECK:  No jugular venous distention, waveform within normal limits, carotid upstroke brisk and symmetric, no bruits, no thyromegaly LUNGS:  Clear to auscultation bilaterally CHEST:  Unremarkable HEART:  PMI not displaced or sustained,S1 and S2 within normal limits, no S3, no S4, no clicks, no rubs, no murmurs ABD:  Flat, positive bowel sounds normal in frequency in pitch, no bruits, no rebound, no guarding, no midline pulsatile mass, no hepatomegaly, no splenomegaly EXT:  2 plus pulses throughout, no edema, no cyanosis no clubbing   EKG:  EKG is not ordered today. The ekg ordered today demonstrates sinus bradycardia, rate 51 occasional junctional escape beat, questionable sinoatrial exit block.   Recent Labs: 12/22/2020: ALT 18; BUN 25; Creatinine, Ser 1.17; Hemoglobin 15.4; Magnesium 2.2; Platelets 145.0; Potassium 3.7; Sodium 141; TSH 3.47     Lipid Panel    Component Value Date/Time   CHOL 157 12/22/2020 1402   TRIG 245.0 (H) 12/22/2020 1402   HDL 38.30 (L) 12/22/2020 1402   CHOLHDL 4 12/22/2020 1402   VLDL 49.0 (H) 12/22/2020 1402   LDLCALC 50 12/10/2020 1442   LDLDIRECT 83.0 12/22/2020 1402      Wt Readings from Last 3 Encounters:  01/07/21 134 lb (60.8 kg)  12/22/20 134 lb 9.6 oz (61.1 kg)  06/09/20 140 lb (63.5 kg)      Other studies Reviewed: Additional studies/ records that were reviewed today include: Labs. Review of the above records demonstrates:  Please see elsewhere in the note.     ASSESSMENT AND PLAN:   NICM:  EF was mildy reduced at 45 - 50% on echo in June 2020.  I am going to check another echocardiogram in June as this will have been 2 years.  She has no symptoms.  No change in therapy at this point.  Moderate MR:  There was moderate MR on the previous echo.  I will follow this with the echo as above.   Hypertension: Her blood pressure is well controlled.  No change in therapy.  Bradycardia:     She has absolutely no symptoms related to this.  We talked about this again today.  She would let me know if this happens.  No change in therapy.  CKD IIIA: He has been worried because her GFR was mildly reduced.  I reviewed this with her.  The last creatinine was 1.17 and I reassured her.   Current medicines are reviewed at length with the patient today.  The patient does not have concerns regarding medicines.  The following changes have been made:  None  Labs/ tests ordered today include:   Orders Placed This Encounter  Procedures  . EKG 12-Lead  . ECHOCARDIOGRAM COMPLETE     Disposition:   FU with me in 12 months    Signed, Minus Breeding, MD  01/07/2021 12:36 PM    [name]

## 2021-01-07 ENCOUNTER — Other Ambulatory Visit: Payer: Self-pay

## 2021-01-07 ENCOUNTER — Encounter: Payer: Self-pay | Admitting: Cardiology

## 2021-01-07 ENCOUNTER — Ambulatory Visit: Payer: Medicare Other | Admitting: Cardiology

## 2021-01-07 VITALS — BP 122/62 | HR 51 | Ht 64.0 in | Wt 134.0 lb

## 2021-01-07 DIAGNOSIS — I340 Nonrheumatic mitral (valve) insufficiency: Secondary | ICD-10-CM | POA: Diagnosis not present

## 2021-01-07 DIAGNOSIS — I428 Other cardiomyopathies: Secondary | ICD-10-CM

## 2021-01-07 DIAGNOSIS — I10 Essential (primary) hypertension: Secondary | ICD-10-CM | POA: Diagnosis not present

## 2021-01-07 DIAGNOSIS — R001 Bradycardia, unspecified: Secondary | ICD-10-CM

## 2021-01-07 NOTE — Patient Instructions (Signed)
Medication Instructions:  The current medical regimen is effective;  continue present plan and medications.  *If you need a refill on your cardiac medications before your next appointment, please call your pharmacy*   Testing/Procedures: Echocardiogram - Your physician has requested that you have an echocardiogram. Echocardiography is a painless test that uses sound waves to create images of your heart. It provides your doctor with information about the size and shape of your heart and how well your heart's chambers and valves are working. This procedure takes approximately one hour. There are no restrictions for this procedure. This will be performed at our [address] location - [address], [address].    Follow-Up: At [facility], you and your health needs are our priority.  As part of our continuing mission to provide you with exceptional heart care, we have created designated Provider Care Teams.  These Care Teams include your primary Cardiologist (physician) and Advanced Practice Providers (APPs -  Physician Assistants and Nurse Practitioners) who all work together to provide you with the care you need, when you need it.  We recommend signing up for the patient portal called "MyChart".  Sign up information is provided on this After Visit Summary.  MyChart is used to connect with patients for Virtual Visits (Telemedicine).  Patients are able to view lab/test results, encounter notes, upcoming appointments, etc.  Non-urgent messages can be sent to your provider as well.   To learn more about what you can do with MyChart, go to [email or web address].    Your next appointment:   12 month(s)  The format for your next appointment:   In Person  Provider:   James Hochrein, MD   

## 2021-01-26 ENCOUNTER — Other Ambulatory Visit: Payer: Self-pay | Admitting: Cardiology

## 2021-04-09 ENCOUNTER — Other Ambulatory Visit: Payer: Self-pay | Admitting: Cardiology

## 2021-05-12 ENCOUNTER — Other Ambulatory Visit: Payer: Self-pay | Admitting: Cardiology

## 2021-05-12 ENCOUNTER — Other Ambulatory Visit: Payer: Self-pay | Admitting: Internal Medicine

## 2021-05-12 NOTE — Telephone Encounter (Signed)
Please refill as per office routine med refill policy (all routine meds refilled for 3 mo or monthly per pt preference up to one year from last visit, then month to month grace period for 3 mo, then further med refills will have to be denied)  

## 2021-05-13 ENCOUNTER — Other Ambulatory Visit: Payer: Self-pay

## 2021-05-13 MED ORDER — CARVEDILOL 25 MG PO TABS: 25.0000 mg | ORAL_TABLET | Freq: Two times a day (BID) | ORAL | 2 refills | Status: DC

## 2021-05-18 ENCOUNTER — Telehealth: Payer: Self-pay | Admitting: Internal Medicine

## 2021-05-18 NOTE — Telephone Encounter (Signed)
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

## 2021-05-18 NOTE — Telephone Encounter (Signed)
Patient called and was wondering if she needed to get the second [place] booster shot. She said that she got her first one in October. She said that she is going on a cruise soon and was wondering if she needed to get it. She can be reached at [phone]. Please advise

## 2021-05-19 NOTE — Telephone Encounter (Signed)
Tried calling patient, no answer, unable to leave message. 

## 2021-05-21 NOTE — Telephone Encounter (Signed)
Patient has been notified

## 2021-06-02 ENCOUNTER — Other Ambulatory Visit: Payer: Self-pay

## 2021-06-02 ENCOUNTER — Ambulatory Visit (HOSPITAL_COMMUNITY): Payer: Medicare Other | Attending: Internal Medicine

## 2021-06-02 DIAGNOSIS — I340 Nonrheumatic mitral (valve) insufficiency: Secondary | ICD-10-CM | POA: Diagnosis present

## 2021-06-02 LAB — ECHOCARDIOGRAM COMPLETE
Area-P 1/2: 3.89 cm2
S' Lateral: 3.4 cm

## 2021-06-11 ENCOUNTER — Ambulatory Visit: Payer: Medicare Other | Admitting: Internal Medicine

## 2021-06-11 ENCOUNTER — Encounter: Payer: Self-pay | Admitting: Internal Medicine

## 2021-06-11 ENCOUNTER — Other Ambulatory Visit: Payer: Self-pay

## 2021-06-11 VITALS — BP 118/72 | HR 57 | Temp 98.0°F | Resp 18 | Ht 64.0 in | Wt 131.6 lb

## 2021-06-11 DIAGNOSIS — N1831 Chronic kidney disease, stage 3a: Secondary | ICD-10-CM

## 2021-06-11 DIAGNOSIS — K121 Other forms of stomatitis: Secondary | ICD-10-CM | POA: Diagnosis not present

## 2021-06-11 DIAGNOSIS — E1165 Type 2 diabetes mellitus with hyperglycemia: Secondary | ICD-10-CM | POA: Diagnosis not present

## 2021-06-11 DIAGNOSIS — J029 Acute pharyngitis, unspecified: Secondary | ICD-10-CM

## 2021-06-11 DIAGNOSIS — I10 Essential (primary) hypertension: Secondary | ICD-10-CM

## 2021-06-11 MED ORDER — AZITHROMYCIN 250 MG PO TABS: ORAL_TABLET | ORAL | 0 refills | Status: AC

## 2021-06-11 MED ORDER — TRIAMCINOLONE ACETONIDE 0.1 % MT PSTE: 1.0000 "application " | PASTE | Freq: Two times a day (BID) | OROMUCOSAL | 12 refills | Status: DC

## 2021-06-11 NOTE — Patient Instructions (Signed)
Please take all new medication as prescribed - the kenalog in orabase for the ulcer, and the zpack for the throat  Please continue all other medications as before, and refills have been done if requested.  Please have the pharmacy call with any other refills you may need.  Please keep your appointments with your specialists as you may have planned  Have Fun on your Trip!

## 2021-06-11 NOTE — Assessment & Plan Note (Signed)
Small, likley stress related, for kanalog in orabase asd prn,  to f/u any worsening symptoms or concerns

## 2021-06-11 NOTE — Assessment & Plan Note (Signed)
Lab Results  Component Value Date   CREATININE 1.17 12/22/2020   Stable overall, cont to avoid nephrotoxins

## 2021-06-11 NOTE — Assessment & Plan Note (Signed)
With acute on chronic tonsillitis - Mild to mod, for antibx course,  to f/u any worsening symptoms or concerns

## 2021-06-11 NOTE — Assessment & Plan Note (Signed)
BP Readings from Last 3 Encounters:  06/11/21 118/72  01/07/21 122/62  12/22/20 118/66   Stable, pt to continue medical treatment coreg, lisinopril, chlorthalidone

## 2021-06-11 NOTE — Assessment & Plan Note (Signed)
Lab Results  Component Value Date   HGBA1C 6.6 (H) 12/22/2020   Stable, pt to continue current medical treatment metformin

## 2021-06-11 NOTE — Progress Notes (Signed)
Patient ID: Carol Lawrence, female   DOB: 1936/05/01, 85 y.o.   MRN: [id number]        Chief Complaint: mouth ulcer, and ST       HPI:  Carol Lawrence is a 85 y.o. female here levaing for a cruise to [place] tomorrow; with c/o 3 days onset ulcer to the right mid roof of the mouth, shallow with some discomfort, started suddenly, similar to ulcers previous and already slighlty starting to improve.  Also incidentally with 4 day onset ST with increased pain, swelling and increased tonsil stones one or two fallen out and swallowed.  Has felt some warm but no high fever or chills  Pt denies chest pain, increased sob or doe, wheezing, orthopnea, PND, increased LE swelling, palpitations, dizziness or syncope.   Pt denies polydipsia, polyuria, or new focal neuro s/s.         Wt Readings from Last 3 Encounters:  06/11/21 131 lb 9.6 oz (59.7 kg)  01/07/21 134 lb (60.8 kg)  12/22/20 134 lb 9.6 oz (61.1 kg)   BP Readings from Last 3 Encounters:  06/11/21 118/72  01/07/21 122/62  12/22/20 118/66         Past Medical History:  Diagnosis Date   Allergic rhinitis    Bell's palsy    Cancer ([place])    [phone]   Cardiomyopathy, dilated ([place]) 08/26/2011   15% 2011, 55% 2012   Carotid stenosis 09/12/2012   Bilateral, mild Lifeline screening   CHF (congestive heart failure) ([facility])    Clotting disorder ([facility])    Colonic polyp    DVT (deep venous thrombosis) ([facility])    Glucose intolerance (impaired glucose tolerance)    HLD (hyperlipidemia)    HTN (hypertension)    NICM (nonischemic cardiomyopathy) ([place])    Coronaries; left main essentially was nonexistent or very short with   PVD (peripheral vascular disease) ([place])    Past Surgical History:  Procedure Laterality Date   COLONOSCOPY     hysterectomy - unknown type     LUMBAR FUSION  1983   OOPHORECTOMY     POLYPECTOMY     TUBAL LIGATION      reports that she quit smoking about 40 years ago. She has never used smokeless tobacco. She reports  previous drug use. She reports that she does not drink alcohol. family history includes Breast cancer (age of onset: 50) in her daughter; COPD in her mother and another family member; Colon polyps in her brother and sister; Coronary artery disease in an other family member; Hyperlipidemia in an other family member; Leukemia in her sister, sister and another family member; Liver cancer in an other family member; Lung cancer in an other family member; Melanoma in an other family member; Osteoporosis in her mother and another family member. Allergies  Allergen Reactions   Amoxicillin Itching    Curam---from [place]   Codeine Itching   Current Outpatient Medications on File Prior to Visit  Medication Sig Dispense Refill   aspirin 81 MG tablet Take 81 mg by mouth daily.     carvedilol (COREG) 25 MG tablet Take 1 tablet (25 mg total) by mouth 2 (two) times daily. 180 tablet 2   chlorthalidone (HYGROTON) 25 MG tablet TAKE 1 TABLET BY MOUTH EVERY DAY 90 tablet 3   cholecalciferol (VITAMIN D3) 25 MCG (1000 UT) tablet Take 1,000 Units by mouth daily.     lisinopril (ZESTRIL) 20 MG tablet TAKE 1 TABLET BY MOUTH TWICE A DAY 180  tablet 3   metFORMIN (GLUCOPHAGE-XR) 500 MG 24 hr tablet TAKE 1 TABLET BY MOUTH EVERY DAY WITH BREAKFAST 90 tablet 3   pravastatin (PRAVACHOL) 80 MG tablet TAKE 1 TABLET BY MOUTH EVERY DAY 90 tablet 3   No current facility-administered medications on file prior to visit.        ROS:  All others reviewed and negative.  Objective        PE:  BP 118/72   Pulse (!) 57   Temp 98 F (36.7 C) (Oral)   Resp 18   Ht 5\' 4"  (1.626 m)   Wt 131 lb 9.6 oz (59.7 kg)   SpO2 98%   BMI 22.59 kg/m                 Constitutional: Pt appears in NAD               HENT: Head: NCAT.                Right Ear: External ear normal.                 Left Ear: External ear normal.                Eyes: . Pupils are equal, round, and reactive to light. Conjunctivae and EOM are normal                Nose: without d/c or deformity               Neck: Neck supple. Gross normal ROM; mouth with < 5 mm shallow right mid roof of mouth ulcer mild tender; pharynx with large left tonsilalr hypertrophy red swelling and several tonsillar stones               Cardiovascular: Normal rate and regular rhythm.                 Pulmonary/Chest: Effort normal and breath sounds without rales or wheezing.                Abd:  Soft, NT, ND, + BS, no organomegaly               Neurological: Pt is alert. At baseline orientation, motor grossly intact               Skin: Skin is warm. No rashes, no other new lesions, LE edema - none               Psychiatric: Pt behavior is normal without agitation   Micro: none  Cardiac tracings I have personally interpreted today:  none  Pertinent Radiological findings (summarize): none   Lab Results  Component Value Date   WBC 6.2 12/22/2020   HGB 15.4 (H) 12/22/2020   HCT 45.5 12/22/2020   PLT 145.0 (L) 12/22/2020   GLUCOSE 110 (H) 12/22/2020   CHOL 157 12/22/2020   TRIG 245.0 (H) 12/22/2020   HDL 38.30 (L) 12/22/2020   LDLDIRECT 83.0 12/22/2020   LDLCALC 50 12/10/2020   ALT 18 12/22/2020   AST 19 12/22/2020   NA 141 12/22/2020   K 3.7 12/22/2020   CL 102 12/22/2020   CREATININE 1.17 12/22/2020   BUN 25 (H) 12/22/2020   CO2 32 12/22/2020   TSH 3.47 12/22/2020   INR 1.0 ratio 03/25/2010   HGBA1C 6.6 (H) 12/22/2020   MICROALBUR 2.9 (H) 12/22/2020   Assessment/Plan:  Carol Lawrence is a 85 y.o. White or Caucasian [1]  female with  has a past medical history of Allergic rhinitis, Bell's palsy, Cancer ([place]), Cardiomyopathy, dilated ([place]) (08/26/2011), Carotid stenosis (09/12/2012), CHF (congestive heart failure) ([place]), Clotting disorder ([place]), Colonic polyp, DVT (deep venous thrombosis) ([facility]), Glucose intolerance (impaired glucose tolerance), HLD (hyperlipidemia), HTN (hypertension), NICM (nonischemic cardiomyopathy) ([place]), and PVD (peripheral vascular disease)  ([place]).  Mouth ulcer Small, likley stress related, for kanalog in orabase asd prn,  to f/u any worsening symptoms or concerns  Diabetes ([place]) Lab Results  Component Value Date   HGBA1C 6.6 (H) 12/22/2020   Stable, pt to continue current medical treatment metformin   CKD (chronic kidney disease) stage 3, GFR 30-59 ml/min Lab Results  Component Value Date   CREATININE 1.17 12/22/2020   Stable overall, cont to avoid nephrotoxins   Acute pharyngitis With acute on chronic tonsillitis - Mild to mod, for antibx course,  to f/u any worsening symptoms or concerns  Essential hypertension BP Readings from Last 3 Encounters:  06/11/21 118/72  01/07/21 122/62  12/22/20 118/66   Stable, pt to continue medical treatment coreg, lisinopril, chlorthalidone  Followup: Return if symptoms worsen or fail to improve.  Cathlean Cower, MD 06/11/2021 7:49 PM [facility]

## 2021-06-22 ENCOUNTER — Ambulatory Visit: Payer: Medicare Other | Admitting: Internal Medicine

## 2021-06-24 ENCOUNTER — Encounter: Payer: Self-pay | Admitting: Internal Medicine

## 2021-06-24 ENCOUNTER — Other Ambulatory Visit: Payer: Self-pay

## 2021-06-24 ENCOUNTER — Ambulatory Visit: Payer: Medicare Other | Admitting: Internal Medicine

## 2021-06-24 VITALS — BP 100/50 | HR 67 | Temp 98.5°F | Ht 64.0 in | Wt 132.0 lb

## 2021-06-24 DIAGNOSIS — I10 Essential (primary) hypertension: Secondary | ICD-10-CM | POA: Diagnosis not present

## 2021-06-24 DIAGNOSIS — E7849 Other hyperlipidemia: Secondary | ICD-10-CM | POA: Diagnosis not present

## 2021-06-24 DIAGNOSIS — E559 Vitamin D deficiency, unspecified: Secondary | ICD-10-CM | POA: Diagnosis not present

## 2021-06-24 DIAGNOSIS — N1831 Chronic kidney disease, stage 3a: Secondary | ICD-10-CM | POA: Diagnosis not present

## 2021-06-24 DIAGNOSIS — E1165 Type 2 diabetes mellitus with hyperglycemia: Secondary | ICD-10-CM | POA: Diagnosis not present

## 2021-06-24 LAB — LIPID PANEL
Cholesterol: 119 mg/dL (ref 0–200)
HDL: 32 mg/dL — ABNORMAL LOW (ref >39.00)
LDL Cholesterol: 53 mg/dL (ref 0–99)
NonHDL: 86.62
Total CHOL/HDL Ratio: 4
Triglycerides: 166 mg/dL — ABNORMAL HIGH (ref 0.0–149.0)
VLDL: 33.2 mg/dL (ref 0.0–40.0)

## 2021-06-24 LAB — BASIC METABOLIC PANEL
BUN: 26 mg/dL — ABNORMAL HIGH (ref 6–23)
CO2: 28 mEq/L (ref 19–32)
Calcium: 9.9 mg/dL (ref 8.4–10.5)
Chloride: 102 mEq/L (ref 96–112)
Creatinine, Ser: 1.31 mg/dL — ABNORMAL HIGH (ref 0.40–1.20)
GFR: 37.89 mL/min — ABNORMAL LOW (ref >60.00)
Glucose, Bld: 138 mg/dL — ABNORMAL HIGH (ref 70–99)
Potassium: 3.5 mEq/L (ref 3.5–5.1)
Sodium: 140 mEq/L (ref 135–145)

## 2021-06-24 LAB — VITAMIN D 25 HYDROXY (VIT D DEFICIENCY, FRACTURES): VITD: 50.74 ng/mL (ref 30.00–100.00)

## 2021-06-24 LAB — HEPATIC FUNCTION PANEL
ALT: 15 U/L (ref 0–35)
AST: 17 U/L (ref 0–37)
Albumin: 4.1 g/dL (ref 3.5–5.2)
Alkaline Phosphatase: 40 U/L (ref 39–117)
Bilirubin, Direct: 0.1 mg/dL (ref 0.0–0.3)
Total Bilirubin: 0.5 mg/dL (ref 0.2–1.2)
Total Protein: 6.9 g/dL (ref 6.0–8.3)

## 2021-06-24 LAB — HEMOGLOBIN A1C: Hgb A1c MFr Bld: 7.1 % — ABNORMAL HIGH (ref 4.6–6.5)

## 2021-06-24 NOTE — Assessment & Plan Note (Signed)
Last vitamin D Lab Results  Component Value Date   VD25OH 51.76 12/22/2020   Stable, cont oral replacement

## 2021-06-24 NOTE — Progress Notes (Signed)
Patient ID: Carol Lawrence, female   DOB: 1936-01-27, 85 y.o.   MRN: [id number]        Chief Complaint: follow up HTN, HLD and hyperglycemia , ckd, vir d deficiency       HPI:  Carol Lawrence is a 85 y.o. female here overall doing ok; Pt denies chest pain, increased sob or doe, wheezing, orthopnea, PND, increased LE swelling, palpitations, dizziness or syncope.   Pt denies polydipsia, polyuria, or new focal neuro s/s.   Pt denies fever, wt loss, night sweats, loss of appetite, or other constitutional symptoms  She attributes elevated a1c recently due to covid booster  Does not want second agent for HLD, admits to some dietary noncompliance recently.  BP somewhat lower toda but denies orthostasis.   Wt Readings from Last 3 Encounters:  06/24/21 132 lb (59.9 kg)  06/11/21 131 lb 9.6 oz (59.7 kg)  01/07/21 134 lb (60.8 kg)   BP Readings from Last 3 Encounters:  06/24/21 (!) 100/50  06/11/21 118/72  01/07/21 122/62         Past Medical History:  Diagnosis Date   Allergic rhinitis    Bell's palsy    Cancer ([place])    [phone]   Cardiomyopathy, dilated ([place]) 08/26/2011   15% 2011, 55% 2012   Carotid stenosis 09/12/2012   Bilateral, mild Lifeline screening   CHF (congestive heart failure) (HCC)    Clotting disorder (HCC)    Colonic polyp    DVT (deep venous thrombosis) (HCC)    Glucose intolerance (impaired glucose tolerance)    HLD (hyperlipidemia)    HTN (hypertension)    NICM (nonischemic cardiomyopathy) ([place])    Coronaries; left main essentially was nonexistent or very short with   PVD (peripheral vascular disease) ([place])    Past Surgical History:  Procedure Laterality Date   COLONOSCOPY     hysterectomy - unknown type     LUMBAR FUSION  1983   OOPHORECTOMY     POLYPECTOMY     TUBAL LIGATION      reports that she quit smoking about 40 years ago. Her smoking use included cigarettes. She has never used smokeless tobacco. She reports previous drug use. She reports that  she does not drink alcohol. family history includes Breast cancer (age of onset: 44) in her daughter; COPD in her mother and another family member; Colon polyps in her brother and sister; Coronary artery disease in an other family member; Hyperlipidemia in an other family member; Leukemia in her sister, sister and another family member; Liver cancer in an other family member; Lung cancer in an other family member; Melanoma in an other family member; Osteoporosis in her mother and another family member. Allergies  Allergen Reactions   Amoxicillin Itching    Curam---from [facility]   Codeine Itching   Current Outpatient Medications on File Prior to Visit  Medication Sig Dispense Refill   aspirin 81 MG tablet Take 81 mg by mouth daily.     carvedilol (COREG) 25 MG tablet Take 1 tablet (25 mg total) by mouth 2 (two) times daily. 180 tablet 2   chlorthalidone (HYGROTON) 25 MG tablet TAKE 1 TABLET BY MOUTH EVERY DAY 90 tablet 3   cholecalciferol (VITAMIN D3) 25 MCG (1000 UT) tablet Take 1,000 Units by mouth daily.     lisinopril (ZESTRIL) 20 MG tablet TAKE 1 TABLET BY MOUTH TWICE A DAY 180 tablet 3   metFORMIN (GLUCOPHAGE-XR) 500 MG 24 hr tablet TAKE 1 TABLET BY MOUTH  EVERY DAY WITH BREAKFAST 90 tablet 3   pravastatin (PRAVACHOL) 80 MG tablet TAKE 1 TABLET BY MOUTH EVERY DAY 90 tablet 3   triamcinolone (KENALOG) 0.1 % paste Use as directed 1 application in the mouth or throat 2 (two) times daily. 5 g 12   No current facility-administered medications on file prior to visit.        ROS:  All others reviewed and negative.  Objective        PE:  BP (!) 100/50 (BP Location: Right Arm, Patient Position: Sitting, Cuff Size: Normal)   Pulse 67   Temp 98.5 F (36.9 C) (Oral)   Ht 5\' 4"  (1.626 m)   Wt 132 lb (59.9 kg)   SpO2 98%   BMI 22.66 kg/m                 Constitutional: Pt appears in NAD               HENT: Head: NCAT.                Right Ear: External ear normal.                 Left Ear:  External ear normal.                Eyes: . Pupils are equal, round, and reactive to light. Conjunctivae and EOM are normal               Nose: without d/c or deformity               Neck: Neck supple. Gross normal ROM               Cardiovascular: Normal rate and regular rhythm.                 Pulmonary/Chest: Effort normal and breath sounds without rales or wheezing.                Abd:  Soft, NT, ND, + BS, no organomegaly               Neurological: Pt is alert. At baseline orientation, motor grossly intact               Skin: Skin is warm. No rashes, no other new lesions, LE edema - none               Psychiatric: Pt behavior is normal without agitation , 1+ nervous  Micro: none  Cardiac tracings I have personally interpreted today:  none  Pertinent Radiological findings (summarize): none   Lab Results  Component Value Date   WBC 6.2 12/22/2020   HGB 15.4 (H) 12/22/2020   HCT 45.5 12/22/2020   PLT 145.0 (L) 12/22/2020   GLUCOSE 138 (H) 06/24/2021   CHOL 119 06/24/2021   TRIG 166.0 (H) 06/24/2021   HDL 32.00 (L) 06/24/2021   LDLDIRECT 83.0 12/22/2020   LDLCALC 53 06/24/2021   ALT 15 06/24/2021   AST 17 06/24/2021   NA 140 06/24/2021   K 3.5 06/24/2021   CL 102 06/24/2021   CREATININE 1.31 (H) 06/24/2021   BUN 26 (H) 06/24/2021   CO2 28 06/24/2021   TSH 3.47 12/22/2020   INR 1.0 ratio 03/25/2010   HGBA1C 7.1 (H) 06/24/2021   MICROALBUR 2.9 (H) 12/22/2020   Assessment/Plan:  [name] is a 85 y.o. White or Caucasian [1] female with  has a past medical history of Allergic rhinitis,  Bell's palsy, Cancer ([place]), Cardiomyopathy, dilated ([place]) (08/26/2011), Carotid stenosis (09/12/2012), CHF (congestive heart failure) ([place]), Clotting disorder ([place]), Colonic polyp, DVT (deep venous thrombosis) ([place]), Glucose intolerance (impaired glucose tolerance), HLD (hyperlipidemia), HTN (hypertension), NICM (nonischemic cardiomyopathy) ([place]), and PVD (peripheral vascular disease)  ([place]).  Vitamin D deficiency Last vitamin D Lab Results  Component Value Date   VD25OH 51.76 12/22/2020   Stable, cont oral replacement   CKD (chronic kidney disease) stage 3, GFR 30-59 ml/min Lab Results  Component Value Date   CREATININE 1.31 (H) 06/24/2021   Stable overall, cont to avoid nephrotoxins   Diabetes ([place]) Lab Results  Component Value Date   HGBA1C 7.1 (H) 06/24/2021   Mild uncontrolled, pt to continue current medical treatment given age with upper limit a1c of 7.5 as goal ; cont metformin   Essential hypertension BP Readings from Last 3 Encounters:  06/24/21 (!) 100/50  06/11/21 118/72  01/07/21 122/62   ? overcontrolled, pt asympt, pt to continue medical treatment as declines change for now - coreg, lisinopril, hct   HLD (hyperlipidemia) Lab Results  Component Value Date   LDLCALC 53 06/24/2021   Stable, pt to continue current statin pravachol 80 Followup: No follow-ups on file.  Cathlean Cower, MD 06/25/2021 7:57 PM [facility]

## 2021-06-24 NOTE — Patient Instructions (Signed)
Please continue all other medications as before, and refills have been done if requested.  Please have the pharmacy call with any other refills you may need.  Please continue your efforts at being more active, low cholesterol diet, and weight control.  Please keep your appointments with your specialists as you may have planned  Please go to the LAB at the blood drawing area for the tests to be done  You will be contacted by phone if any changes need to be made immediately.  Otherwise, you will receive a letter about your results with an explanation, but please check with MyChart first.  Please remember to sign up for MyChart if you have not done so, as this will be important to you in the future with finding out test results, communicating by private email, and scheduling acute appointments online when needed.  Please make an Appointment to return in 6 months, or sooner if needed, with blood work done at the [facility] lab site a few days ahead

## 2021-06-25 NOTE — Assessment & Plan Note (Signed)
Lab Results  Component Value Date   HGBA1C 7.1 (H) 06/24/2021   Mild uncontrolled, pt to continue current medical treatment given age with upper limit a1c of 7.5 as goal ; cont metformin

## 2021-06-25 NOTE — Assessment & Plan Note (Signed)
Lab Results  Component Value Date   CREATININE 1.31 (H) 06/24/2021   Stable overall, cont to avoid nephrotoxins

## 2021-06-25 NOTE — Addendum Note (Signed)
Addended by: Biagio Borg on: 06/25/2021 07:59 PM   Modules accepted: Orders

## 2021-06-25 NOTE — Assessment & Plan Note (Signed)
BP Readings from Last 3 Encounters:  06/24/21 (!) 100/50  06/11/21 118/72  01/07/21 122/62   ? overcontrolled, pt asympt, pt to continue medical treatment as declines change for now - coreg, lisinopril, hct

## 2021-06-25 NOTE — Assessment & Plan Note (Signed)
Lab Results  Component Value Date   LDLCALC 53 06/24/2021   Stable, pt to continue current statin pravachol 80

## 2021-06-27 ENCOUNTER — Encounter: Payer: Self-pay | Admitting: Internal Medicine

## 2021-06-29 ENCOUNTER — Telehealth: Payer: Self-pay | Admitting: Internal Medicine

## 2021-06-29 NOTE — Telephone Encounter (Signed)
   Patient is requesting results from recent labs be mailed to her

## 2021-06-30 NOTE — Telephone Encounter (Signed)
Results mailed 

## 2021-07-29 ENCOUNTER — Other Ambulatory Visit: Payer: Self-pay | Admitting: Obstetrics and Gynecology

## 2021-07-29 DIAGNOSIS — Z1231 Encounter for screening mammogram for malignant neoplasm of breast: Secondary | ICD-10-CM

## 2021-09-17 ENCOUNTER — Other Ambulatory Visit: Payer: Self-pay

## 2021-09-17 ENCOUNTER — Ambulatory Visit
Admission: RE | Admit: 2021-09-17 | Discharge: 2021-09-17 | Disposition: A | Discharge status: Home / Self Care | Payer: Medicare Other | Source: Ambulatory Visit | Attending: Obstetrics and Gynecology | Admitting: Obstetrics and Gynecology

## 2021-09-17 DIAGNOSIS — Z1231 Encounter for screening mammogram for malignant neoplasm of breast: Secondary | ICD-10-CM

## 2021-10-01 ENCOUNTER — Ambulatory Visit: Payer: Medicare Other

## 2021-12-24 ENCOUNTER — Other Ambulatory Visit (INDEPENDENT_AMBULATORY_CARE_PROVIDER_SITE_OTHER): Payer: Medicare HMO

## 2021-12-24 ENCOUNTER — Ambulatory Visit (INDEPENDENT_AMBULATORY_CARE_PROVIDER_SITE_OTHER): Payer: Medicare HMO | Admitting: Internal Medicine

## 2021-12-24 ENCOUNTER — Other Ambulatory Visit: Payer: Self-pay

## 2021-12-24 ENCOUNTER — Encounter: Payer: Self-pay | Admitting: Internal Medicine

## 2021-12-24 DIAGNOSIS — E559 Vitamin D deficiency, unspecified: Secondary | ICD-10-CM | POA: Diagnosis not present

## 2021-12-24 DIAGNOSIS — E1165 Type 2 diabetes mellitus with hyperglycemia: Secondary | ICD-10-CM | POA: Diagnosis not present

## 2021-12-24 DIAGNOSIS — J069 Acute upper respiratory infection, unspecified: Secondary | ICD-10-CM

## 2021-12-24 LAB — BASIC METABOLIC PANEL
BUN: 25 mg/dL — ABNORMAL HIGH (ref 6–23)
CO2: 33 mEq/L — ABNORMAL HIGH (ref 19–32)
Calcium: 9.6 mg/dL (ref 8.4–10.5)
Chloride: 99 mEq/L (ref 96–112)
Creatinine, Ser: 1.25 mg/dL — ABNORMAL HIGH (ref 0.40–1.20)
GFR: 39.94 mL/min — ABNORMAL LOW (ref >60.00)
Glucose, Bld: 169 mg/dL — ABNORMAL HIGH (ref 70–99)
Potassium: 3.1 mEq/L — ABNORMAL LOW (ref 3.5–5.1)
Sodium: 140 mEq/L (ref 135–145)

## 2021-12-24 LAB — HEMOGLOBIN A1C: Hgb A1c MFr Bld: 7.2 % — ABNORMAL HIGH (ref 4.6–6.5)

## 2021-12-24 LAB — CBC WITH DIFFERENTIAL/PLATELET
Basophils Absolute: 0 10*3/uL (ref 0.0–0.1)
Basophils Relative: 0.5 % (ref 0.0–3.0)
Eosinophils Absolute: 0.2 10*3/uL (ref 0.0–0.7)
Eosinophils Relative: 2.7 % (ref 0.0–5.0)
HCT: 43.6 % (ref 36.0–46.0)
Hemoglobin: 14.4 g/dL (ref 12.0–15.0)
Lymphocytes Relative: 14.6 % (ref 12.0–46.0)
Lymphs Abs: 1 10*3/uL (ref 0.7–4.0)
MCHC: 33.1 g/dL (ref 30.0–36.0)
MCV: 89.2 fl (ref 78.0–100.0)
Monocytes Absolute: 0.6 10*3/uL (ref 0.1–1.0)
Monocytes Relative: 8.8 % (ref 3.0–12.0)
Neutro Abs: 5.2 10*3/uL (ref 1.4–7.7)
Neutrophils Relative %: 73.4 % (ref 43.0–77.0)
Platelets: 150 10*3/uL (ref 150.0–400.0)
RBC: 4.89 Mil/uL (ref 3.87–5.11)
RDW: 13.5 % (ref 11.5–15.5)
WBC: 7.1 10*3/uL (ref 4.0–10.5)

## 2021-12-24 LAB — MICROALBUMIN / CREATININE URINE RATIO
Creatinine,U: 173.8 mg/dL
Microalb Creat Ratio: 1.6 mg/g (ref 0.0–30.0)
Microalb, Ur: 2.9 mg/dL — ABNORMAL HIGH (ref 0.0–1.9)

## 2021-12-24 LAB — URINALYSIS, ROUTINE W REFLEX MICROSCOPIC
Bilirubin Urine: NEGATIVE
Ketones, ur: NEGATIVE
Nitrite: NEGATIVE
Specific Gravity, Urine: 1.025 (ref 1.000–1.030)
Urine Glucose: NEGATIVE
Urobilinogen, UA: 0.2 (ref 0.0–1.0)
pH: 6 (ref 5.0–8.0)

## 2021-12-24 LAB — HEPATIC FUNCTION PANEL
ALT: 12 U/L (ref 0–35)
AST: 13 U/L (ref 0–37)
Albumin: 4.1 g/dL (ref 3.5–5.2)
Alkaline Phosphatase: 48 U/L (ref 39–117)
Bilirubin, Direct: 0.1 mg/dL (ref 0.0–0.3)
Total Bilirubin: 0.6 mg/dL (ref 0.2–1.2)
Total Protein: 7.1 g/dL (ref 6.0–8.3)

## 2021-12-24 LAB — LIPID PANEL
Cholesterol: 121 mg/dL (ref 0–200)
HDL: 31.4 mg/dL — ABNORMAL LOW (ref >39.00)
LDL Cholesterol: 64 mg/dL (ref 0–99)
NonHDL: 90.08
Total CHOL/HDL Ratio: 4
Triglycerides: 132 mg/dL (ref 0.0–149.0)
VLDL: 26.4 mg/dL (ref 0.0–40.0)

## 2021-12-24 LAB — TSH: TSH: 4.7 u[IU]/mL (ref 0.35–5.50)

## 2021-12-24 LAB — VITAMIN D 25 HYDROXY (VIT D DEFICIENCY, FRACTURES): VITD: 54.77 ng/mL (ref 30.00–100.00)

## 2021-12-24 NOTE — Assessment & Plan Note (Signed)
Outside window for tamiflu and covid-19 antiviral. Home covid-19 tests negative twice. Overall is mildly improving, lungs clear. No indication for antibiotics. Advised to start zyrtec. Using otc for cough with relief. Advised if no improvement in 1-2 weeks or worsening call or return.

## 2021-12-24 NOTE — Progress Notes (Signed)
° °  Subjective:   Patient ID: Carol Lawrence, female    DOB: 07-29-1936, 86 y.o.   MRN: [id number]  HPI The patient is an 86 YO female coming in for 7 days of cough and fever with colored mucus. 2 negative home covid-19 tests  Review of Systems  Constitutional:  Positive for activity change and appetite change. Negative for chills, fatigue, fever and unexpected weight change.  HENT:  Positive for congestion. Negative for ear discharge, ear pain, postnasal drip, rhinorrhea, sinus pressure, sinus pain, sneezing, sore throat, tinnitus, trouble swallowing and voice change.   Eyes: Negative.   Respiratory:  Positive for cough. Negative for chest tightness, shortness of breath and wheezing.   Cardiovascular: Negative.   Gastrointestinal: Negative.   Musculoskeletal:  Positive for myalgias.  Neurological: Negative.    Objective:  Physical Exam Constitutional:      Appearance: She is well-developed.  HENT:     Head: Normocephalic and atraumatic.     Comments: Oropharynx with redness and clear drainage, nose with swollen turbinates, TMs normal bilaterally.  Neck:     Thyroid: No thyromegaly.  Cardiovascular:     Rate and Rhythm: Normal rate and regular rhythm.  Pulmonary:     Effort: Pulmonary effort is normal. No respiratory distress.     Breath sounds: Normal breath sounds. No wheezing or rales.  Abdominal:     General: Bowel sounds are normal. There is no distension.     Palpations: Abdomen is soft.     Tenderness: There is no abdominal tenderness. There is no rebound.  Musculoskeletal:     Cervical back: Normal range of motion.  Lymphadenopathy:     Cervical: No cervical adenopathy.  Skin:    General: Skin is warm and dry.  Neurological:     Mental Status: She is alert and oriented to person, place, and time.     Coordination: Coordination normal.    Vitals:   12/24/21 0847  BP: 134/74  Pulse: 63  Resp: 18  Temp: 97.7 F (36.5 C)  TempSrc: Oral  SpO2: 96%  Weight:  130 lb 3.2 oz (59.1 kg)  Height: 5\' 4"  (1.626 m)    This visit occurred during the SARS-CoV-2 public health emergency.  Safety protocols were in place, including screening questions prior to the visit, additional usage of staff PPE, and extensive cleaning of exam room while observing appropriate contact time as indicated for disinfecting solutions.   Assessment & Plan:

## 2021-12-24 NOTE — Patient Instructions (Signed)
I would recommend to take zyrtec over the counter daily for the next 1-2 weeks to clear the drainage.   The cough may last another several weeks. If you notice any problems with breathing or feel you are getting worse instead of better let us know.

## 2021-12-27 ENCOUNTER — Ambulatory Visit: Payer: Medicare Other | Admitting: Internal Medicine

## 2021-12-31 ENCOUNTER — Other Ambulatory Visit: Payer: Self-pay

## 2021-12-31 ENCOUNTER — Ambulatory Visit (INDEPENDENT_AMBULATORY_CARE_PROVIDER_SITE_OTHER): Payer: Medicare HMO | Admitting: Internal Medicine

## 2021-12-31 ENCOUNTER — Encounter: Payer: Self-pay | Admitting: Internal Medicine

## 2021-12-31 VITALS — BP 116/58 | HR 74 | Temp 99.2°F | Ht 64.0 in | Wt 132.0 lb

## 2021-12-31 DIAGNOSIS — E7849 Other hyperlipidemia: Secondary | ICD-10-CM | POA: Diagnosis not present

## 2021-12-31 DIAGNOSIS — E559 Vitamin D deficiency, unspecified: Secondary | ICD-10-CM

## 2021-12-31 DIAGNOSIS — R202 Paresthesia of skin: Secondary | ICD-10-CM

## 2021-12-31 DIAGNOSIS — E1165 Type 2 diabetes mellitus with hyperglycemia: Secondary | ICD-10-CM | POA: Diagnosis not present

## 2021-12-31 DIAGNOSIS — I10 Essential (primary) hypertension: Secondary | ICD-10-CM | POA: Diagnosis not present

## 2021-12-31 DIAGNOSIS — Z0001 Encounter for general adult medical examination with abnormal findings: Secondary | ICD-10-CM | POA: Diagnosis not present

## 2021-12-31 DIAGNOSIS — N1831 Chronic kidney disease, stage 3a: Secondary | ICD-10-CM

## 2021-12-31 DIAGNOSIS — J069 Acute upper respiratory infection, unspecified: Secondary | ICD-10-CM

## 2021-12-31 DIAGNOSIS — H903 Sensorineural hearing loss, bilateral: Secondary | ICD-10-CM

## 2021-12-31 NOTE — Progress Notes (Signed)
Patient ID: Carol Lawrence, female   DOB: 03-11-1936, 86 y.o.   MRN: [id number]         Chief Complaint:: wellness exam and Follow-up  Burning feet at bedtime, hearing loss worsening, recent URI symptoms, DM       HPI:  Carol Lawrence is a 86 y.o. female here for wellness exam ;   Plans to call soon for eye exam - last one was 2 yrs. Recent dxa per GYN c/w iimprovement per pt.  Declines covid booster, shingrix, o/w up to date        Also with c/o 3 mo worsening mild feet burning that only seems to occur at bedtime, nothng seems to me better or worse.  Has also had worsening hearing bilateral in the past yr without wax, HA, fever, ear pain, vertigo, tinnitus or sinus symptoms, except did have mild URI sinus symptoms last wk with congestion and discomfort now essentially resolved today.  Pt denies chest pain, increased sob or doe, wheezing, orthopnea, PND, increased LE swelling, palpitations, dizziness or syncope.   Pt denies polydipsia, polyuria, or new focal neuro s/s    Pt denies fever, wt loss, night sweats, loss of appetite, or other constitutional symptoms  .     Wt Readings from Last 3 Encounters:  12/31/21 132 lb (59.9 kg)  12/24/21 130 lb 3.2 oz (59.1 kg)  06/24/21 132 lb (59.9 kg)   BP Readings from Last 3 Encounters:  12/31/21 (!) 116/58  12/24/21 134/74  06/24/21 (!) 100/50   Immunization History  Administered Date(s) Administered   Fluad Quad(high Dose 65+) 09/13/2019   Influenza Split 09/14/2011, 09/18/2012   Influenza Whole 10/23/2007, 09/23/2008, 09/29/2009, 09/09/2010   Influenza, High Dose Seasonal PF 09/13/2016, 09/07/2017   Influenza,inj,Quad PF,6+ Mos 09/17/2013, 09/09/2014, 09/15/2015   Influenza-Unspecified 09/18/2018, 09/30/2020, 10/05/2021   PFIZER(Purple Top)SARS-COV-2 Vaccination 01/08/2020, 02/04/2020, 09/21/2020   Pneumococcal Conjugate-13 09/17/2013   Pneumococcal Polysaccharide-23 08/19/2005, 08/30/2011   Td 10/19/1998, 01/16/2009   Tdap 08/06/2019    Zoster, Live 10/18/2006   There are no preventive care reminders to display for this patient.     Past Medical History:  Diagnosis Date   Allergic rhinitis    Bell's palsy    Cancer ([place])    [phone]   Cardiomyopathy, dilated ([place]) 08/26/2011   15% 2011, 55% 2012   Carotid stenosis 09/12/2012   Bilateral, mild Lifeline screening   CHF (congestive heart failure) ([facility])    Clotting disorder ([facility])    Colonic polyp    DVT (deep venous thrombosis) ([facility])    Glucose intolerance (impaired glucose tolerance)    HLD (hyperlipidemia)    HTN (hypertension)    NICM (nonischemic cardiomyopathy) ([place])    Coronaries; left main essentially was nonexistent or very short with   PVD (peripheral vascular disease) ([place])    Past Surgical History:  Procedure Laterality Date   COLONOSCOPY     hysterectomy - unknown type     LUMBAR FUSION  1983   OOPHORECTOMY     POLYPECTOMY     TUBAL LIGATION      reports that she quit smoking about 41 years ago. Her smoking use included cigarettes. She has never used smokeless tobacco. She reports that she does not currently use drugs. She reports that she does not drink alcohol. family history includes Breast cancer (age of onset: 43) in her daughter; COPD in her mother and another family member; Colon polyps in her brother and sister; Coronary artery disease in an  other family member; Hyperlipidemia in an other family member; Leukemia in her sister, sister and another family member; Liver cancer in an other family member; Lung cancer in an other family member; Melanoma in an other family member; Osteoporosis in her mother and another family member. Allergies  Allergen Reactions   Amoxicillin Itching    Curam---from [facility]   Codeine Itching   Current Outpatient Medications on File Prior to Visit  Medication Sig Dispense Refill   aspirin 81 MG tablet Take 81 mg by mouth daily.     carvedilol (COREG) 25 MG tablet Take 1 tablet (25 mg total) by mouth 2 (two)  times daily. 180 tablet 2   chlorthalidone (HYGROTON) 25 MG tablet TAKE 1 TABLET BY MOUTH EVERY DAY 90 tablet 3   cholecalciferol (VITAMIN D3) 25 MCG (1000 UT) tablet Take 1,000 Units by mouth daily.     lisinopril (ZESTRIL) 20 MG tablet TAKE 1 TABLET BY MOUTH TWICE A DAY 180 tablet 3   metFORMIN (GLUCOPHAGE-XR) 500 MG 24 hr tablet TAKE 1 TABLET BY MOUTH EVERY DAY WITH BREAKFAST 90 tablet 3   pravastatin (PRAVACHOL) 80 MG tablet TAKE 1 TABLET BY MOUTH EVERY DAY 90 tablet 3   triamcinolone (KENALOG) 0.1 % paste Use as directed 1 application in the mouth or throat 2 (two) times daily. 5 g 12   No current facility-administered medications on file prior to visit.        ROS:  All others reviewed and negative.  Objective        PE:  BP (!) 116/58 (BP Location: Right Arm, Patient Position: Sitting, Cuff Size: Normal)    Pulse 74    Temp 99.2 F (37.3 C) (Oral)    Ht 5\' 4"  (1.626 m)    Wt 132 lb (59.9 kg)    SpO2 97%    BMI 22.66 kg/m                 Constitutional: Pt appears in NAD               HENT: Head: NCAT.                Right Ear: External ear normal.                 Left Ear: External ear normal.                Eyes: . Pupils are equal, round, and reactive to light. Conjunctivae and EOM are normal               Nose: without d/c or deformity               Neck: Neck supple. Gross normal ROM               Cardiovascular: Normal rate and regular rhythm.                 Pulmonary/Chest: Effort normal and breath sounds without rales or wheezing.                Abd:  Soft, NT, ND, + BS, no organomegaly               Neurological: Pt is alert. At baseline orientation, motor grossly intact               Skin: Skin is warm. No rashes, no other new lesions, LE edema - none  Psychiatric: Pt behavior is normal without agitation   Micro: none  Cardiac tracings I have personally interpreted today:  none  Pertinent Radiological findings (summarize): none   Lab Results   Component Value Date   WBC 7.1 12/24/2021   HGB 14.4 12/24/2021   HCT 43.6 12/24/2021   PLT 150.0 12/24/2021   GLUCOSE 169 (H) 12/24/2021   CHOL 121 12/24/2021   TRIG 132.0 12/24/2021   HDL 31.40 (L) 12/24/2021   LDLDIRECT 83.0 12/22/2020   LDLCALC 64 12/24/2021   ALT 12 12/24/2021   AST 13 12/24/2021   NA 140 12/24/2021   K 3.1 (L) 12/24/2021   CL 99 12/24/2021   CREATININE 1.25 (H) 12/24/2021   BUN 25 (H) 12/24/2021   CO2 33 (H) 12/24/2021   TSH 4.70 12/24/2021   INR 1.0 ratio 03/25/2010   HGBA1C 7.2 (H) 12/24/2021   MICROALBUR 2.9 (H) 12/24/2021   Assessment/Plan:  Carol Lawrence is a 86 y.o. White or Caucasian [1] female with  has a past medical history of Allergic rhinitis, Bell's palsy, Cancer ([place]), Cardiomyopathy, dilated ([place]) (08/26/2011), Carotid stenosis (09/12/2012), CHF (congestive heart failure) ([place]), Clotting disorder ([place]), Colonic polyp, DVT (deep venous thrombosis) ([facility]), Glucose intolerance (impaired glucose tolerance), HLD (hyperlipidemia), HTN (hypertension), NICM (nonischemic cardiomyopathy) ([place]), and PVD (peripheral vascular disease) ([place]).  Vitamin D deficiency Last vitamin D Lab Results  Component Value Date   VD25OH 54.77 12/24/2021   Stable, cont oral replacement   Encounter for well adult exam with abnormal findings Age and sex appropriate education and counseling updated with regular exercise and diet Referrals for preventative services - none needed Immunizations addressed - declines covid booster, shingrix Smoking counseling  - none needed Evidence for depression or other mood disorder - none significant Most recent labs reviewed. I have personally reviewed and have noted: 1) the patient's medical and social history 2) The patient's current medications and supplements 3) The patient's height, weight, and BMI have been recorded in the chart   Viral URI essentially resolved, ok for mucinex bid prn  CKD (chronic kidney disease)  stage 3, GFR 30-59 ml/min Lab Results  Component Value Date   CREATININE 1.25 (H) 12/24/2021   Stable overall, cont to avoid nephrotoxins   Diabetes ([place]) Lab Results  Component Value Date   HGBA1C 7.2 (H) 12/24/2021   Mild uncontrolled but ok for age , pt to continue current medical treatment metformin   Essential hypertension BP Readings from Last 3 Encounters:  12/31/21 (!) 116/58  12/24/21 134/74  06/24/21 (!) 100/50   Stable, pt to continue medical treatment coreg, hct   HLD (hyperlipidemia) Lab Results  Component Value Date   LDLCALC 64 12/24/2021   Stable, pt to continue current statin pravachol   Bilateral hearing loss With mild worsening, pt encouraged for OTC hearing aides  Paresthesia of both feet With mild neuropathy - declines elavil qhs for now  Followup: Return in about 6 months (around 06/30/2022).  Cathlean Cower, MD 01/01/2022 2:59 PM [facility]

## 2021-12-31 NOTE — Assessment & Plan Note (Signed)
Last vitamin D Lab Results  Component Value Date   VD25OH 54.77 12/24/2021   Stable, cont oral replacement

## 2021-12-31 NOTE — Patient Instructions (Signed)
Please consider the OTC hearing aides at [facility] or [facility]or [facility])  Please continue all other medications as before, and refills have been done if requested.  Please have the pharmacy call with any other refills you may need.  Please continue your efforts at being more active, low cholesterol diet, and weight control.  You are otherwise up to date with prevention measures today.  Please keep your appointments with your specialists as you may have planned  Please make an Appointment to return in 6 months, or sooner if needed, also with Lab Appointment for testing done 3-5 days before at the [facility] (so this is for TWO appointments - please see the scheduling desk as you leave)  Due to the ongoing Covid 19 pandemic, our lab now requires an appointment for any labs done at our office.  If you need labs done and do not have an appointment, please call our office ahead of time to schedule before presenting to the lab for your testing.

## 2022-01-01 ENCOUNTER — Encounter: Payer: Self-pay | Admitting: Internal Medicine

## 2022-01-01 DIAGNOSIS — H9193 Unspecified hearing loss, bilateral: Secondary | ICD-10-CM | POA: Insufficient documentation

## 2022-01-01 NOTE — Assessment & Plan Note (Signed)
BP Readings from Last 3 Encounters:  12/31/21 (!) 116/58  12/24/21 134/74  06/24/21 (!) 100/50   Stable, pt to continue medical treatment coreg, hct

## 2022-01-01 NOTE — Assessment & Plan Note (Signed)
Lab Results  Component Value Date   LDLCALC 64 12/24/2021   Stable, pt to continue current statin pravachol

## 2022-01-01 NOTE — Assessment & Plan Note (Signed)
Lab Results  Component Value Date   CREATININE 1.25 (H) 12/24/2021   Stable overall, cont to avoid nephrotoxins

## 2022-01-01 NOTE — Assessment & Plan Note (Signed)

## 2022-01-01 NOTE — Assessment & Plan Note (Signed)
With mild worsening, pt encouraged for OTC hearing aides

## 2022-01-01 NOTE — Assessment & Plan Note (Signed)
essentially resolved, ok for mucinex bid prn

## 2022-01-01 NOTE — Assessment & Plan Note (Signed)
Lab Results  Component Value Date   HGBA1C 7.2 (H) 12/24/2021   Mild uncontrolled but ok for age , pt to continue current medical treatment metformin

## 2022-01-01 NOTE — Assessment & Plan Note (Signed)
With mild neuropathy - declines elavil qhs for now

## 2022-01-09 NOTE — Progress Notes (Signed)
Cardiology Office Note   Date:  01/10/2022   ID:  Carol Lawrence, DOB 26-Sep-1936, MRN [id number]  PCP:  Biagio Borg, MD  Cardiologist:   Minus Breeding, MD   Chief Complaint  Patient presents with   Cardiomyopathy      History of Present Illness: Carol Lawrence is a 86 y.o. female who presents for followup of her cardiomyopathy. Her EF improved from a low of 15% to 55%.  However, her EF was slightly lower at 45% in 2016 on follow up echo. This was unchanged in 2020 and she had mild/mod MR.   Since I last saw her she has done okay.  She works at her daughters [employer] and she works in the [employer].  She during the summer mows her own lawn with a push mower. The patient denies any new symptoms such as chest discomfort, neck or arm discomfort. There has been no new shortness of breath, PND or orthopnea. There have been no reported palpitations, presyncope or syncope.    Past Medical History:  Diagnosis Date   Allergic rhinitis    Bell's palsy    Cancer ([place])    [phone]   Cardiomyopathy, dilated ([place]) 08/26/2011   15% 2011, 55% 2012   Carotid stenosis 09/12/2012   Bilateral, mild Lifeline screening   CHF (congestive heart failure) (HCC)    Clotting disorder ([place])    Colonic polyp    DVT (deep venous thrombosis) (HCC)    Glucose intolerance (impaired glucose tolerance)    HLD (hyperlipidemia)    HTN (hypertension)    NICM (nonischemic cardiomyopathy) ([place])    Coronaries; left main essentially was nonexistent or very short with   PVD (peripheral vascular disease) ([place])     Past Surgical History:  Procedure Laterality Date   COLONOSCOPY     hysterectomy - unknown type     LUMBAR FUSION  1983   OOPHORECTOMY     POLYPECTOMY     TUBAL LIGATION       Current Outpatient Medications  Medication Sig Dispense Refill   aspirin 81 MG tablet Take 81 mg by mouth daily.     carvedilol (COREG) 25 MG tablet Take 1 tablet (25 mg total) by mouth 2  (two) times daily. 180 tablet 2   chlorthalidone (HYGROTON) 25 MG tablet TAKE 1 TABLET BY MOUTH EVERY DAY 90 tablet 3   cholecalciferol (VITAMIN D3) 25 MCG (1000 UT) tablet Take 1,000 Units by mouth daily.     lisinopril (ZESTRIL) 20 MG tablet TAKE 1 TABLET BY MOUTH TWICE A DAY 180 tablet 3   metFORMIN (GLUCOPHAGE-XR) 500 MG 24 hr tablet TAKE 1 TABLET BY MOUTH EVERY DAY WITH BREAKFAST 90 tablet 3   pravastatin (PRAVACHOL) 80 MG tablet TAKE 1 TABLET BY MOUTH EVERY DAY 90 tablet 3   No current facility-administered medications for this visit.    Allergies:   Amoxicillin and Codeine   ROS:  Please see the history of present illness.   Otherwise, review of systems are positive for neuropathy.   All other systems are reviewed and negative.    PHYSICAL EXAM: VS:  BP (!) 118/58    Pulse (!) 48    Ht 5\' 5"  (1.651 m)    Wt 132 lb 6.4 oz (60.1 kg)    SpO2 97%    BMI 22.03 kg/m  , BMI Body mass index is 22.03 kg/m.  GENERAL:  Well appearing NECK:  No jugular venous distention, waveform  within normal limits, carotid upstroke brisk and symmetric, no bruits, no thyromegaly LUNGS:  Clear to auscultation bilaterally CHEST:  Unremarkable HEART:  PMI not displaced or sustained,S1 and S2 within normal limits, no S3, no S4, no clicks, no rubs, none murmurs ABD:  Flat, positive bowel sounds normal in frequency in pitch, no bruits, no rebound, no guarding, no midline pulsatile mass, no hepatomegaly, no splenomegaly EXT:  2 plus pulses throughout, no edema, no cyanosis no clubbing  EKG:  EKG is  ordered today. The ekg ordered today demonstrates sinus bradycardia, rate 48 occasional junctional escape beat, questionable sinoatrial exit block.   Recent Labs: 12/24/2021: ALT 12; BUN 25; Creatinine, Ser 1.25; Hemoglobin 14.4; Platelets 150.0; Potassium 3.1; Sodium 140; TSH 4.70    Lipid Panel    Component Value Date/Time   CHOL 121 12/24/2021 0906   TRIG 132.0 12/24/2021 0906   HDL 31.40 (L) 12/24/2021  0906   CHOLHDL 4 12/24/2021 0906   VLDL 26.4 12/24/2021 0906   LDLCALC 64 12/24/2021 0906   LDLDIRECT 83.0 12/22/2020 1402      Wt Readings from Last 3 Encounters:  01/10/22 132 lb 6.4 oz (60.1 kg)  12/31/21 132 lb (59.9 kg)  12/24/21 130 lb 3.2 oz (59.1 kg)      Other studies Reviewed: Additional studies/ records that were reviewed today include: Labs. Review of the above records demonstrates:  Please see elsewhere in the note.     ASSESSMENT AND PLAN:    NICM:  EF was low normal at 50 - 55%.  No change in therapy.    Moderate MR: This was mild on echo last year.  No change in therapy.  Hypertension:    The blood pressure is okay.  No change in therapy.  Bradycardia:   She has no symptoms related to this so I will continue the beta-blocker.  CKD IIIA:   Creatinine is 1.25.  This has been unchanged.  No change in therapy.   Current medicines are reviewed at length with the patient today.  The patient does not have concerns regarding medicines.  The following changes have been made:  None  Labs/ tests ordered today include: None  Orders Placed This Encounter  Procedures   EKG 12-Lead     Disposition:   FU with me in 12 months    Signed, Minus Breeding, MD  01/10/2022 11:48 AM    [place]

## 2022-01-10 ENCOUNTER — Ambulatory Visit: Payer: Medicare HMO | Admitting: Cardiology

## 2022-01-10 ENCOUNTER — Other Ambulatory Visit: Payer: Self-pay

## 2022-01-10 ENCOUNTER — Encounter: Payer: Self-pay | Admitting: Cardiology

## 2022-01-10 VITALS — BP 118/58 | HR 48 | Ht 65.0 in | Wt 132.4 lb

## 2022-01-10 DIAGNOSIS — R001 Bradycardia, unspecified: Secondary | ICD-10-CM | POA: Diagnosis not present

## 2022-01-10 DIAGNOSIS — I10 Essential (primary) hypertension: Secondary | ICD-10-CM | POA: Diagnosis not present

## 2022-01-10 DIAGNOSIS — N1831 Chronic kidney disease, stage 3a: Secondary | ICD-10-CM | POA: Diagnosis not present

## 2022-01-10 DIAGNOSIS — I340 Nonrheumatic mitral (valve) insufficiency: Secondary | ICD-10-CM | POA: Diagnosis not present

## 2022-01-10 DIAGNOSIS — I428 Other cardiomyopathies: Secondary | ICD-10-CM

## 2022-01-10 NOTE — Patient Instructions (Signed)
Medication Instructions:  Your physician recommends that you continue on your current medications as directed. Please refer to the Current Medication list given to you today.  *If you need a refill on your cardiac medications before your next appointment, please call your pharmacy*  Lab Work: NONE  Testing/Procedures: NONE  Follow-Up: At [facility], you and your health needs are our priority.  As part of our continuing mission to provide you with exceptional heart care, we have created designated Provider Care Teams.  These Care Teams include your primary Cardiologist (physician) and Advanced Practice Providers (APPs -  Physician Assistants and Nurse Practitioners) who all work together to provide you with the care you need, when you need it.  We recommend signing up for the patient portal called "MyChart".  Sign up information is provided on this After Visit Summary.  MyChart is used to connect with patients for Virtual Visits (Telemedicine).  Patients are able to view lab/test results, encounter notes, upcoming appointments, etc.  Non-urgent messages can be sent to your provider as well.   To learn more about what you can do with MyChart, go to [email or web address].    Your next appointment:   12 month(s)  The format for your next appointment:   In Person  Provider:   Minus Breeding, MD

## 2022-01-15 ENCOUNTER — Other Ambulatory Visit: Payer: Self-pay | Admitting: Cardiology

## 2022-02-03 ENCOUNTER — Other Ambulatory Visit: Payer: Self-pay | Admitting: Cardiology

## 2022-02-25 DIAGNOSIS — R32 Unspecified urinary incontinence: Secondary | ICD-10-CM | POA: Diagnosis not present

## 2022-02-25 DIAGNOSIS — Z7982 Long term (current) use of aspirin: Secondary | ICD-10-CM | POA: Diagnosis not present

## 2022-02-25 DIAGNOSIS — Z7984 Long term (current) use of oral hypoglycemic drugs: Secondary | ICD-10-CM | POA: Diagnosis not present

## 2022-02-25 DIAGNOSIS — Z8249 Family history of ischemic heart disease and other diseases of the circulatory system: Secondary | ICD-10-CM | POA: Diagnosis not present

## 2022-02-25 DIAGNOSIS — Z008 Encounter for other general examination: Secondary | ICD-10-CM | POA: Diagnosis not present

## 2022-02-25 DIAGNOSIS — Z885 Allergy status to narcotic agent status: Secondary | ICD-10-CM | POA: Diagnosis not present

## 2022-02-25 DIAGNOSIS — E1151 Type 2 diabetes mellitus with diabetic peripheral angiopathy without gangrene: Secondary | ICD-10-CM | POA: Diagnosis not present

## 2022-02-25 DIAGNOSIS — Z825 Family history of asthma and other chronic lower respiratory diseases: Secondary | ICD-10-CM | POA: Diagnosis not present

## 2022-02-25 DIAGNOSIS — Z87891 Personal history of nicotine dependence: Secondary | ICD-10-CM | POA: Diagnosis not present

## 2022-02-25 DIAGNOSIS — I10 Essential (primary) hypertension: Secondary | ICD-10-CM | POA: Diagnosis not present

## 2022-02-25 DIAGNOSIS — E785 Hyperlipidemia, unspecified: Secondary | ICD-10-CM | POA: Diagnosis not present

## 2022-02-25 DIAGNOSIS — Z85831 Personal history of malignant neoplasm of soft tissue: Secondary | ICD-10-CM | POA: Diagnosis not present

## 2022-03-11 ENCOUNTER — Other Ambulatory Visit: Payer: Self-pay | Admitting: Cardiology

## 2022-04-04 ENCOUNTER — Encounter: Payer: Self-pay | Admitting: Internal Medicine

## 2022-04-04 ENCOUNTER — Other Ambulatory Visit: Payer: Self-pay | Admitting: Internal Medicine

## 2022-04-04 ENCOUNTER — Ambulatory Visit (INDEPENDENT_AMBULATORY_CARE_PROVIDER_SITE_OTHER): Payer: Medicare HMO | Admitting: Internal Medicine

## 2022-04-04 VITALS — BP 110/60 | HR 51 | Temp 98.1°F | Ht 65.0 in | Wt 134.0 lb

## 2022-04-04 DIAGNOSIS — I10 Essential (primary) hypertension: Secondary | ICD-10-CM

## 2022-04-04 DIAGNOSIS — N1831 Chronic kidney disease, stage 3a: Secondary | ICD-10-CM | POA: Diagnosis not present

## 2022-04-04 DIAGNOSIS — E1165 Type 2 diabetes mellitus with hyperglycemia: Secondary | ICD-10-CM | POA: Diagnosis not present

## 2022-04-04 DIAGNOSIS — I739 Peripheral vascular disease, unspecified: Secondary | ICD-10-CM | POA: Diagnosis not present

## 2022-04-04 DIAGNOSIS — E7849 Other hyperlipidemia: Secondary | ICD-10-CM | POA: Diagnosis not present

## 2022-04-04 DIAGNOSIS — E559 Vitamin D deficiency, unspecified: Secondary | ICD-10-CM

## 2022-04-04 DIAGNOSIS — E538 Deficiency of other specified B group vitamins: Secondary | ICD-10-CM | POA: Diagnosis not present

## 2022-04-04 LAB — CBC WITH DIFFERENTIAL/PLATELET
Basophils Absolute: 0 10*3/uL (ref 0.0–0.1)
Basophils Relative: 0.5 % (ref 0.0–3.0)
Eosinophils Absolute: 0.1 10*3/uL (ref 0.0–0.7)
Eosinophils Relative: 2.4 % (ref 0.0–5.0)
HCT: 42.1 % (ref 36.0–46.0)
Hemoglobin: 14.1 g/dL (ref 12.0–15.0)
Lymphocytes Relative: 15.3 % (ref 12.0–46.0)
Lymphs Abs: 0.8 10*3/uL (ref 0.7–4.0)
MCHC: 33.5 g/dL (ref 30.0–36.0)
MCV: 88.8 fl (ref 78.0–100.0)
Monocytes Absolute: 0.5 10*3/uL (ref 0.1–1.0)
Monocytes Relative: 9.4 % (ref 3.0–12.0)
Neutro Abs: 3.7 10*3/uL (ref 1.4–7.7)
Neutrophils Relative %: 72.4 % (ref 43.0–77.0)
Platelets: 125 10*3/uL — ABNORMAL LOW (ref 150.0–400.0)
RBC: 4.74 Mil/uL (ref 3.87–5.11)
RDW: 14.1 % (ref 11.5–15.5)
WBC: 5.1 10*3/uL (ref 4.0–10.5)

## 2022-04-04 LAB — LIPID PANEL
Cholesterol: 133 mg/dL (ref 0–200)
HDL: 37 mg/dL — ABNORMAL LOW (ref >39.00)
LDL Cholesterol: 66 mg/dL (ref 0–99)
NonHDL: 96.05
Total CHOL/HDL Ratio: 4
Triglycerides: 150 mg/dL — ABNORMAL HIGH (ref 0.0–149.0)
VLDL: 30 mg/dL (ref 0.0–40.0)

## 2022-04-04 LAB — HEPATIC FUNCTION PANEL
ALT: 15 U/L (ref 0–35)
AST: 16 U/L (ref 0–37)
Albumin: 4.3 g/dL (ref 3.5–5.2)
Alkaline Phosphatase: 43 U/L (ref 39–117)
Bilirubin, Direct: 0.1 mg/dL (ref 0.0–0.3)
Total Bilirubin: 0.5 mg/dL (ref 0.2–1.2)
Total Protein: 7 g/dL (ref 6.0–8.3)

## 2022-04-04 LAB — BASIC METABOLIC PANEL
BUN: 24 mg/dL — ABNORMAL HIGH (ref 6–23)
CO2: 31 mEq/L (ref 19–32)
Calcium: 9.6 mg/dL (ref 8.4–10.5)
Chloride: 102 mEq/L (ref 96–112)
Creatinine, Ser: 1.26 mg/dL — ABNORMAL HIGH (ref 0.40–1.20)
GFR: 39.48 mL/min — ABNORMAL LOW (ref >60.00)
Glucose, Bld: 153 mg/dL — ABNORMAL HIGH (ref 70–99)
Potassium: 3.3 mEq/L — ABNORMAL LOW (ref 3.5–5.1)
Sodium: 140 mEq/L (ref 135–145)

## 2022-04-04 LAB — VITAMIN B12: Vitamin B-12: 224 pg/mL (ref 211–911)

## 2022-04-04 LAB — HEMOGLOBIN A1C: Hgb A1c MFr Bld: 7.6 % — ABNORMAL HIGH (ref 4.6–6.5)

## 2022-04-04 LAB — VITAMIN D 25 HYDROXY (VIT D DEFICIENCY, FRACTURES): VITD: 45.39 ng/mL (ref 30.00–100.00)

## 2022-04-04 MED ORDER — METFORMIN HCL ER 500 MG PO TB24: ORAL_TABLET | ORAL | 3 refills | Status: DC

## 2022-04-04 NOTE — Assessment & Plan Note (Signed)
Last vitamin D ?Lab Results  ?Component Value Date  ? VD25OH 54.77 12/24/2021  ? ?Stable, cont oral replacement ? ?

## 2022-04-04 NOTE — Progress Notes (Signed)
Patient ID: Carol Lawrence, female   DOB: 1936-08-10, 86 y.o.   MRN: [id number] ? ? ? ?    Chief Complaint: follow up DM, ckd, PAD, former smoker ? ?     HPI:  Carol Lawrence is a 86 y.o. female here to f/u - Home screening per insurance nurse with PAD screen 0.85 on the left, 1.00 on the right.  Quit smoking at 86 yo.  Denies worsening recent claudication.  Pt denies chest pain, increased sob or doe, wheezing, orthopnea, PND, increased LE swelling, palpitations, dizziness or syncope.   Pt denies polydipsia, polyuria, or new focal neuro s/s.    Pt denies fever, wt loss, night sweats, loss of appetite, or other constitutional symptoms   ?      ?Wt Readings from Last 3 Encounters:  ?04/04/22 134 lb (60.8 kg)  ?01/10/22 132 lb 6.4 oz (60.1 kg)  ?12/31/21 132 lb (59.9 kg)  ? ?BP Readings from Last 3 Encounters:  ?04/04/22 110/60  ?01/10/22 (!) 118/58  ?12/31/21 (!) 116/58  ? ?      ?Past Medical History:  ?Diagnosis Date  ? Allergic rhinitis   ? Bell's palsy   ? Cancer [facility])   ? [phone]  ? Cardiomyopathy, dilated ([place]) 08/26/2011  ? 15% 2011, 55% 2012  ? Carotid stenosis 09/12/2012  ? Bilateral, mild Lifeline screening  ? CHF (congestive heart failure) ([place])   ? Clotting disorder ([place])   ? Colonic polyp   ? DVT (deep venous thrombosis) ([place])   ? Glucose intolerance (impaired glucose tolerance)   ? HLD (hyperlipidemia)   ? HTN (hypertension)   ? NICM (nonischemic cardiomyopathy) ([place])   ? Coronaries; left main essentially was nonexistent or very short with  ? PVD (peripheral vascular disease) ([place])   ? ?Past Surgical History:  ?Procedure Laterality Date  ? COLONOSCOPY    ? hysterectomy - unknown type    ? [place]  ? OOPHORECTOMY    ? POLYPECTOMY    ? TUBAL LIGATION    ? ? reports that she quit smoking about 41 years ago. Her smoking use included cigarettes. She has never used smokeless tobacco. She reports that she does not currently use drugs. She reports that she does not drink alcohol. ?family  history includes Breast cancer (age of onset: 48) in her daughter; COPD in her mother and another family member; Colon polyps in her brother and sister; Coronary artery disease in an other family member; Hyperlipidemia in an other family member; Leukemia in her sister, sister and another family member; Liver cancer in an other family member; Lung cancer in an other family member; Melanoma in an other family member; Osteoporosis in her mother and another family member. ?Allergies  ?Allergen Reactions  ? Amoxicillin Itching  ?  Curam---from [place]  ? Codeine Itching  ? ?Current Outpatient Medications on File Prior to Visit  ?Medication Sig Dispense Refill  ? aspirin 81 MG tablet Take 81 mg by mouth daily.    ? carvedilol (COREG) 25 MG tablet TAKE 1 TABLET BY MOUTH TWICE A DAY 180 tablet 3  ? chlorthalidone (HYGROTON) 25 MG tablet TAKE 1 TABLET BY MOUTH EVERY DAY 90 tablet 3  ? cholecalciferol (VITAMIN D3) 25 MCG (1000 UT) tablet Take 1,000 Units by mouth daily.    ? lisinopril (ZESTRIL) 20 MG tablet TAKE 1 TABLET BY MOUTH TWICE A DAY 180 tablet 3  ? pravastatin (PRAVACHOL) 80 MG tablet TAKE 1 TABLET BY MOUTH EVERY DAY  90 tablet 3  ? ?No current facility-administered medications on file prior to visit.  ? ?     ROS:  All others reviewed and negative. ? ?Objective  ? ?     PE:  BP 110/60 (BP Location: Right Arm, Patient Position: Sitting, Cuff Size: Normal)   Pulse (!) 51   Temp 98.1 ?F (36.7 ?C) (Oral)   Ht '5\' 5"'$  (1.651 m)   Wt 134 lb (60.8 kg)   SpO2 98%   BMI 22.30 kg/m?  ? ?              Constitutional: Pt appears in NAD ?              HENT: Head: NCAT.  ?              Right Ear: External ear normal.   ?              Left Ear: External ear normal.  ?              Eyes: . Pupils are equal, round, and reactive to light. Conjunctivae and EOM are normal ?              Nose: without d/c or deformity ?              Neck: Neck supple. Gross normal ROM ?              Cardiovascular: Normal rate and regular rhythm.    ?              Pulmonary/Chest: Effort normal and breath sounds without rales or wheezing.  ?              Abd:  Soft, NT, ND, + BS, no organomegaly ?              Neurological: Pt is alert. At baseline orientation, motor grossly intact ?              Skin: Skin is warm. No rashes, no other new lesions, LE edema - none ?              Psychiatric: Pt behavior is normal without agitation  ? ?Micro: none ? ?Cardiac tracings I have personally interpreted today:  none ? ?Pertinent Radiological findings (summarize): none  ? ?Lab Results  ?Component Value Date  ? WBC 5.1 04/04/2022  ? HGB 14.1 04/04/2022  ? HCT 42.1 04/04/2022  ? PLT 125.0 (L) 04/04/2022  ? GLUCOSE 153 (H) 04/04/2022  ? CHOL 133 04/04/2022  ? TRIG 150.0 (H) 04/04/2022  ? HDL 37.00 (L) 04/04/2022  ? LDLDIRECT 83.0 12/22/2020  ? [place] 66 04/04/2022  ? ALT 15 04/04/2022  ? AST 16 04/04/2022  ? NA 140 04/04/2022  ? K 3.3 (L) 04/04/2022  ? CL 102 04/04/2022  ? CREATININE 1.26 (H) 04/04/2022  ? BUN 24 (H) 04/04/2022  ? CO2 31 04/04/2022  ? TSH 4.70 12/24/2021  ? INR 1.0 ratio 03/25/2010  ? HGBA1C 7.6 (H) 04/04/2022  ? MICROALBUR 2.9 (H) 12/24/2021  ? ?Assessment/Plan:  ?Carol Lawrence is a 86 y.o. White or Caucasian [1] female with  has a past medical history of Allergic rhinitis, Bell's palsy, Cancer ([place]), Cardiomyopathy, dilated ([place]) (08/26/2011), Carotid stenosis (09/12/2012), CHF (congestive heart failure) ([place]), Clotting disorder ([place]), Colonic polyp, DVT (deep venous thrombosis) ([facility]), Glucose intolerance (impaired glucose tolerance), HLD (hyperlipidemia), HTN (hypertension), NICM (nonischemic cardiomyopathy) ([place]), and PVD (peripheral vascular disease) ([place]). ? ?Vitamin  D deficiency ?Last vitamin D ?Lab Results  ?Component Value Date  ? VD25OH 54.77 12/24/2021  ? ?Stable, cont oral replacement ? ? ?PERIPHERAL VASCULAR DISEASE ?With recent finding at least mild disease on the LLE - today for referral for LE arterial study, cont asa 81, and  statin ? ?HLD (hyperlipidemia) ?Lab Results  ?Component Value Date  ? [place] 66 04/04/2022  ? ?Stable, goal ldl < 70, pt to continue current statin pravachol ? ? ?Essential hypertension ?BP Readings from Last 3 Encounters:  ?04/04/22 110/60  ?01/10/22 (!) 118/58  ?12/31/21 (!) 116/58  ? ?Stable, pt to continue medical treatment coreg, hygroton, lisinopril ? ? ?Diabetes ([place]) ?Lab Results  ?Component Value Date  ? HGBA1C 7.6 (H) 04/04/2022  ? ?Mild uncontrolled, goal a1 < 7.5 for age, pt to continue current medical treatment metformin at increased 2 tabs qam ? ? ?CKD (chronic kidney disease) stage 3, GFR 30-59 ml/min ?Lab Results  ?Component Value Date  ? CREATININE 1.26 (H) 04/04/2022  ? ?Stable overall, cont to avoid nephrotoxins ? ?Followup: Return in about 6 months (around 10/04/2022). ? ?Cathlean Cower, MD 04/09/2022 2:59 PM ?[place] ?[place] ?Internal Medicine ?

## 2022-04-04 NOTE — Patient Instructions (Signed)
Please continue all other medications as before, and refills have been done if requested. ? ?Please have the pharmacy call with any other refills you may need. ? ?Please continue your efforts at being more active, low cholesterol diet, and weight control. ? ?Please keep your appointments with your specialists as you may have planned ? ?You will be contacted regarding the referral for: leg circulation testing ? ?Please go to the LAB at the blood drawing area for the tests to be done ? ?You will be contacted by phone if any changes need to be made immediately.  Otherwise, you will receive a letter about your results with an explanation, but please check with MyChart first. ? ?This message exchange with [place] may be billed to your insurance if the message requires medical expertise and takes more than a few minutes of your provider's time. Your copay and deductible will apply to this charge. Do you want to get advice via this message? Reply yes to get advice. Another option is to schedule a visit with our office to address this message.   ? ?Please make an Appointment to return in 6 months, or sooner if needed ?

## 2022-04-09 ENCOUNTER — Encounter: Payer: Self-pay | Admitting: Internal Medicine

## 2022-04-09 NOTE — Assessment & Plan Note (Signed)
Lab Results  ?Component Value Date  ? [place] 66 04/04/2022  ? ?Stable, goal ldl < 70, pt to continue current statin pravachol ? ?

## 2022-04-09 NOTE — Assessment & Plan Note (Addendum)
With recent finding at least mild disease on the LLE - today for referral for LE arterial study, cont asa 81, and statin ?

## 2022-04-09 NOTE — Assessment & Plan Note (Signed)
BP Readings from Last 3 Encounters:  ?04/04/22 110/60  ?01/10/22 (!) 118/58  ?12/31/21 (!) 116/58  ? ?Stable, pt to continue medical treatment coreg, hygroton, lisinopril ? ?

## 2022-04-09 NOTE — Assessment & Plan Note (Addendum)
Lab Results  ?Component Value Date  ? HGBA1C 7.6 (H) 04/04/2022  ? ?Mild uncontrolled, goal a1 < 7.5 for age, pt to continue current medical treatment metformin at increased 2 tabs qam ? ?

## 2022-04-09 NOTE — Assessment & Plan Note (Signed)
Lab Results  ?Component Value Date  ? CREATININE 1.26 (H) 04/04/2022  ? ?Stable overall, cont to avoid nephrotoxins ? ?

## 2022-04-11 ENCOUNTER — Ambulatory Visit (HOSPITAL_COMMUNITY)
Admission: RE | Admit: 2022-04-11 | Discharge: 2022-04-11 | Disposition: A | Discharge status: Home / Self Care | Payer: Medicare HMO | Source: Ambulatory Visit | Attending: Cardiology | Admitting: Cardiology

## 2022-04-11 DIAGNOSIS — I739 Peripheral vascular disease, unspecified: Secondary | ICD-10-CM | POA: Insufficient documentation

## 2022-04-12 ENCOUNTER — Encounter: Payer: Self-pay | Admitting: Internal Medicine

## 2022-05-18 DIAGNOSIS — Z0100 Encounter for examination of eyes and vision without abnormal findings: Secondary | ICD-10-CM | POA: Diagnosis not present

## 2022-05-28 ENCOUNTER — Other Ambulatory Visit: Payer: Self-pay | Admitting: Internal Medicine

## 2022-05-28 NOTE — Telephone Encounter (Signed)
Please refill as per office routine med refill policy (all routine meds to be refilled for 3 mo or monthly (per pt preference) up to one year from last visit, then month to month grace period for 3 mo, then further med refills will have to be denied) ? ?

## 2022-06-07 DIAGNOSIS — L570 Actinic keratosis: Secondary | ICD-10-CM | POA: Diagnosis not present

## 2022-06-07 DIAGNOSIS — Z85828 Personal history of other malignant neoplasm of skin: Secondary | ICD-10-CM | POA: Diagnosis not present

## 2022-06-07 DIAGNOSIS — Z08 Encounter for follow-up examination after completed treatment for malignant neoplasm: Secondary | ICD-10-CM | POA: Diagnosis not present

## 2022-06-07 DIAGNOSIS — L820 Inflamed seborrheic keratosis: Secondary | ICD-10-CM | POA: Diagnosis not present

## 2022-08-06 ENCOUNTER — Emergency Department (HOSPITAL_COMMUNITY): Payer: Medicare HMO

## 2022-08-06 ENCOUNTER — Encounter (HOSPITAL_COMMUNITY): Payer: Self-pay

## 2022-08-06 ENCOUNTER — Emergency Department (HOSPITAL_COMMUNITY)
Admission: EM | Admit: 2022-08-06 | Discharge: 2022-08-06 | Disposition: A | Discharge status: Home / Self Care | Payer: Medicare HMO | Attending: Emergency Medicine | Admitting: Emergency Medicine

## 2022-08-06 ENCOUNTER — Other Ambulatory Visit: Payer: Self-pay

## 2022-08-06 DIAGNOSIS — Z7982 Long term (current) use of aspirin: Secondary | ICD-10-CM | POA: Insufficient documentation

## 2022-08-06 DIAGNOSIS — S0083XA Contusion of other part of head, initial encounter: Secondary | ICD-10-CM | POA: Diagnosis not present

## 2022-08-06 DIAGNOSIS — Z79899 Other long term (current) drug therapy: Secondary | ICD-10-CM | POA: Insufficient documentation

## 2022-08-06 DIAGNOSIS — W19XXXA Unspecified fall, initial encounter: Secondary | ICD-10-CM | POA: Diagnosis not present

## 2022-08-06 DIAGNOSIS — S61327A Laceration with foreign body of left little finger with damage to nail, initial encounter: Secondary | ICD-10-CM | POA: Insufficient documentation

## 2022-08-06 DIAGNOSIS — S6992XA Unspecified injury of left wrist, hand and finger(s), initial encounter: Secondary | ICD-10-CM | POA: Diagnosis not present

## 2022-08-06 DIAGNOSIS — S61217A Laceration without foreign body of left little finger without damage to nail, initial encounter: Secondary | ICD-10-CM | POA: Diagnosis not present

## 2022-08-06 DIAGNOSIS — S0993XA Unspecified injury of face, initial encounter: Secondary | ICD-10-CM | POA: Diagnosis not present

## 2022-08-06 DIAGNOSIS — S199XXA Unspecified injury of neck, initial encounter: Secondary | ICD-10-CM | POA: Diagnosis not present

## 2022-08-06 DIAGNOSIS — S8001XA Contusion of right knee, initial encounter: Secondary | ICD-10-CM | POA: Diagnosis not present

## 2022-08-06 MED ORDER — LIDOCAINE HCL (PF) 1 % IJ SOLN
5.0000 mL | Freq: Once | INTRAMUSCULAR | Status: AC
Start: 2022-08-06 — End: 2022-08-06
  Administered 2022-08-06: 5 mL
  Filled 2022-08-06: qty 30

## 2022-08-06 NOTE — Discharge Instructions (Addendum)
Sutures out in 7 to 10 days.  See your doctor for this or return here

## 2022-08-06 NOTE — ED Provider Notes (Addendum)
Carol Lawrence DEPT Provider Note   CSN: [id number] Arrival date & time: 08/06/22  1947     History  Chief Complaint  Patient presents with   Carol Lawrence is a 86 y.o. female.  86 year old female presents at mechanical fall just prior to arrival.  Did strike her head but no loss of consciousness.  Patient does not use any blood thinners.  She has not had any emesis.  No neck pain.  Complains of pain to her left hand mostly at her left fifth digit.  Does have a laceration on the dorsal aspect at the PIP.  No distal numbness or tingling.  Denies any chest or abdominal discomfort.  No hip pain.  Does have a contusion to her right knee.       Home Medications Prior to Admission medications   Medication Sig Start Date End Date Taking? Authorizing Provider  aspirin 81 MG tablet Take 81 mg by mouth daily.    [provider]  carvedilol (COREG) 25 MG tablet TAKE 1 TABLET BY MOUTH TWICE A DAY 02/03/22   Minus Breeding, MD  chlorthalidone (HYGROTON) 25 MG tablet TAKE 1 TABLET BY MOUTH EVERY DAY 01/17/22   Minus Breeding, MD  cholecalciferol (VITAMIN D3) 25 MCG (1000 UT) tablet Take 1,000 Units by mouth daily.    [provider]  lisinopril (ZESTRIL) 20 MG tablet TAKE 1 TABLET BY MOUTH TWICE A DAY 05/12/21   Minus Breeding, MD  metFORMIN (GLUCOPHAGE-XR) 500 MG 24 hr tablet TAKE 1 TABLET BY MOUTH EVERY DAY WITH BREAKFAST 05/30/22   Biagio Borg, MD  pravastatin (PRAVACHOL) 80 MG tablet TAKE 1 TABLET BY MOUTH EVERY DAY 03/11/22   Minus Breeding, MD      Allergies    Amoxicillin and Codeine    Review of Systems   Review of Systems  All other systems reviewed and are negative.   Physical Exam Updated Vital Signs BP (!) 146/64 (BP Location: Right Arm)   Pulse 72   Temp 98.2 F (36.8 C) (Oral)   Resp 18   Ht 1.626 m ('5\' 4"'$ )   Wt 58.5 kg   SpO2 96%   BMI 22.14 kg/m  Physical Exam Vitals and nursing note reviewed.   Constitutional:      General: She is not in acute distress.    Appearance: Normal appearance. She is well-developed. She is not toxic-appearing.  HENT:     Head: Normocephalic and atraumatic.  Eyes:     General: Lids are normal.     Conjunctiva/sclera: Conjunctivae normal.     Pupils: Pupils are equal, round, and reactive to light.  Neck:     Thyroid: No thyroid mass.     Trachea: No tracheal deviation.  Cardiovascular:     Rate and Rhythm: Normal rate and regular rhythm.     Heart sounds: Normal heart sounds. No murmur heard.    No gallop.  Pulmonary:     Effort: Pulmonary effort is normal. No respiratory distress.     Breath sounds: Normal breath sounds. No stridor. No decreased breath sounds, wheezing, rhonchi or rales.  Abdominal:     General: There is no distension.     Palpations: Abdomen is soft.     Tenderness: There is no abdominal tenderness. There is no rebound.  Musculoskeletal:        General: No tenderness. Normal range of motion.     Cervical back: Normal range of motion and neck  supple.     Right knee: Ecchymosis present. No effusion. Normal range of motion.       Legs:     Comments: Approximately 2 cm laceration noted at left pinky finger.  Neurovascular intact at distal tip of finger  Skin:    General: Skin is warm and dry.     Findings: No abrasion or rash.  Neurological:     Mental Status: She is alert and oriented to person, place, and time. Mental status is at baseline.     GCS: GCS eye subscore is 4. GCS verbal subscore is 5. GCS motor subscore is 6.     Cranial Nerves: No cranial nerve deficit.     Sensory: No sensory deficit.     Motor: Motor function is intact.  Psychiatric:        Attention and Perception: Attention normal.        Speech: Speech normal.        Behavior: Behavior normal.     ED Results / Procedures / Treatments   Labs (all labs ordered are listed, but only abnormal results are displayed) Labs Reviewed - No data to  display  EKG None  Radiology No results found.  Procedures Procedures    Medications Ordered in ED Medications - No data to display  ED Course/ Medical Decision Making/ A&P                           Medical Decision Making Amount and/or Complexity of Data Reviewed Radiology: ordered.  Risk Prescription drug management.   LACERATION REPAIR Performed by: Leota Jacobsen Authorized by: Leota Jacobsen Consent: Verbal consent obtained. Risks and benefits: risks, benefits and alternatives were discussed Consent given by: patient Patient identity confirmed: provided demographic data Prepped and Draped in normal sterile fashion Wound explored  Laceration Location: Left fifth digit  Laceration Length: 1.5 cm  No Foreign Bodies seen or palpated  Anesthesia: local infiltration  Local anesthetic: lidocaine 1% % 0 epinephrine  Anesthetic total: 3.5 ml  Irrigation method: syringe Amount of cleaning: standard  Skin closure: Simple interrupted  Number of sutures: 5  Technique: Simple  Patient tolerance: Patient tolerated the procedure well with no immediate complications.  Patient here after mechanical fall.  Laceration repaired as above.  X-ray of left hand per my interpretation showed no evidence of acute fracture.  She had CTs of maxillofacial, head, cervical spine which were negative per my interpretation review.  Will discharge home       Final Clinical Impression(s) / ED Diagnoses Final diagnoses:  None    Rx / DC Orders ED Discharge Orders     None         Carol Leigh, MD 08/06/22 2254    Carol Leigh, MD 08/06/22 2303

## 2022-08-06 NOTE — ED Provider Triage Note (Signed)
Emergency Medicine Provider Triage Evaluation Note  Carol Lawrence , a 86 y.o. female  was evaluated in triage.  Pt complains of mechanical fall onset PTA. She notes that she was carrying tomatoes when she tripped and fell. She hit her head on the ground. No LOC or vomiting. No meds tried. Tetanus within the past couple of years. No neck pain, back pain, nausea, vomiting.   Review of Systems  Positive: As per HPI Negative:   Physical Exam  BP (!) 146/64 (BP Location: Right Arm)   Pulse 72   Temp 98.2 F (36.8 C) (Oral)   Resp 18   Ht '5\' 4"'$  (1.626 m)   Wt 58.5 kg   SpO2 96%   BMI 22.14 kg/m  Gen:   Awake, no distress   Resp:  Normal effort  MSK:   Moves extremities without difficulty  Other:  Laceration noted to left 5th PIP joint. Full flexion and extension of left 5th finger against resistance. Bruising noted to left palm. No spinal TTP. No facial TTP. Abrasions noted to face.  Medical Decision Making  Medically screening exam initiated at 8:57 PM.  Appropriate orders placed.  Addison Naegeli Laster was informed that the remainder of the evaluation will be completed by another provider, this initial triage assessment does not replace that evaluation, and the importance of remaining in the ED until their evaluation is complete.  Work-up initiated.   Abdallah Hern A, PA-C 08/06/22 2100

## 2022-08-06 NOTE — ED Triage Notes (Addendum)
Fall at home after tripping on the rug while carrying tomatoes ~6PM tonight.   Abrasions above left eye, and on lip.   Pt has ~3 cm laceration to left pinky finger. Good rom.   Denies anticoagulants.

## 2022-08-15 ENCOUNTER — Other Ambulatory Visit: Payer: Self-pay | Admitting: Obstetrics and Gynecology

## 2022-08-15 DIAGNOSIS — Z1231 Encounter for screening mammogram for malignant neoplasm of breast: Secondary | ICD-10-CM

## 2022-08-16 ENCOUNTER — Ambulatory Visit (INDEPENDENT_AMBULATORY_CARE_PROVIDER_SITE_OTHER): Payer: Medicare HMO | Admitting: Internal Medicine

## 2022-08-16 ENCOUNTER — Encounter: Payer: Self-pay | Admitting: Internal Medicine

## 2022-08-16 VITALS — BP 132/70 | HR 69 | Temp 97.9°F | Ht 64.0 in | Wt 131.0 lb

## 2022-08-16 DIAGNOSIS — E559 Vitamin D deficiency, unspecified: Secondary | ICD-10-CM

## 2022-08-16 DIAGNOSIS — S61217D Laceration without foreign body of left little finger without damage to nail, subsequent encounter: Secondary | ICD-10-CM

## 2022-08-16 DIAGNOSIS — I10 Essential (primary) hypertension: Secondary | ICD-10-CM | POA: Diagnosis not present

## 2022-08-16 NOTE — Progress Notes (Unsigned)
Patient ID: Carol Lawrence, female   DOB: May 22, 1936, 86 y.o.   MRN: [id number]        Chief Complaint: follow up HTN, HLD and hyperglycemia, left 5th finger laceration       HPI:  Carol Lawrence is a 86 y.o. female here 10 days after unfortunate accident with a fall on concrete with hands holding tomatoes, leading to laceration 5th finger left hand, had 4 stitches in ED, Tdap updated, and now doing well without fever, chills, redness, swelling or other s/s infection.  Pt denies chest pain, increased sob or doe, wheezing, orthopnea, PND, increased LE swelling, palpitations, dizziness or syncope.   Pt denies polydipsia, polyuria, or new focal neuro s/s.          Wt Readings from Last 3 Encounters:  08/16/22 131 lb (59.4 kg)  08/06/22 129 lb (58.5 kg)  04/04/22 134 lb (60.8 kg)   BP Readings from Last 3 Encounters:  08/16/22 132/70  08/06/22 (!) 146/64  04/04/22 110/60         Past Medical History:  Diagnosis Date   Allergic rhinitis    Bell's palsy    Cancer ([place])    [phone]   Cardiomyopathy, dilated ([place]) 08/26/2011   15% 2011, 55% 2012   Carotid stenosis 09/12/2012   Bilateral, mild Lifeline screening   CHF (congestive heart failure) (HCC)    Clotting disorder (HCC)    Colonic polyp    DVT (deep venous thrombosis) (HCC)    Glucose intolerance (impaired glucose tolerance)    HLD (hyperlipidemia)    HTN (hypertension)    NICM (nonischemic cardiomyopathy) ([place])    Coronaries; left main essentially was nonexistent or very short with   PVD (peripheral vascular disease) ([place])    Past Surgical History:  Procedure Laterality Date   COLONOSCOPY     hysterectomy - unknown type     LUMBAR FUSION  1983   OOPHORECTOMY     POLYPECTOMY     TUBAL LIGATION      reports that she quit smoking about 41 years ago. Her smoking use included cigarettes. She has never used smokeless tobacco. She reports that she does not currently use drugs. She reports that she does not drink  alcohol. family history includes Breast cancer (age of onset: 42) in her daughter; COPD in her mother and another family member; Colon polyps in her brother and sister; Coronary artery disease in an other family member; Hyperlipidemia in an other family member; Leukemia in her sister, sister and another family member; Liver cancer in an other family member; Lung cancer in an other family member; Melanoma in an other family member; Osteoporosis in her mother and another family member. Allergies  Allergen Reactions   Amoxicillin Itching    Curam---from [place]   Codeine Itching   Current Outpatient Medications on File Prior to Visit  Medication Sig Dispense Refill   aspirin 81 MG tablet Take 81 mg by mouth daily.     carvedilol (COREG) 25 MG tablet TAKE 1 TABLET BY MOUTH TWICE A DAY 180 tablet 3   chlorthalidone (HYGROTON) 25 MG tablet TAKE 1 TABLET BY MOUTH EVERY DAY 90 tablet 3   cholecalciferol (VITAMIN D3) 25 MCG (1000 UT) tablet Take 1,000 Units by mouth daily.     lisinopril (ZESTRIL) 20 MG tablet TAKE 1 TABLET BY MOUTH TWICE A DAY 180 tablet 3   metFORMIN (GLUCOPHAGE-XR) 500 MG 24 hr tablet TAKE 1 TABLET BY MOUTH EVERY DAY WITH BREAKFAST 90  tablet 3   pravastatin (PRAVACHOL) 80 MG tablet TAKE 1 TABLET BY MOUTH EVERY DAY 90 tablet 3   No current facility-administered medications on file prior to visit.        ROS:  All others reviewed and negative.  Objective        PE:  BP 132/70 (BP Location: Left Arm, Patient Position: Sitting, Cuff Size: Large)   Pulse 69   Temp 97.9 F (36.6 C) (Oral)   Ht '5\' 4"'$  (1.626 m)   Wt 131 lb (59.4 kg)   SpO2 94%   BMI 22.49 kg/m                 Constitutional: Pt appears in NAD               HENT: Head: NCAT.                Right Ear: External ear normal.                 Left Ear: External ear normal.                Eyes: . Pupils are equal, round, and reactive to light. Conjunctivae and EOM are normal               Nose: without d/c or  deformity               Neck: Neck supple. Gross normal ROM               Cardiovascular: Normal rate and regular rhythm.                 Pulmonary/Chest: Effort normal and breath sounds without rales or wheezing.                              Neurological: Pt is alert. At baseline orientation, motor grossly intact               Skin: Skin is warm. No rashes, no other new lesions, LE edema - none; left 5th finger with 4 stitches removed               Psychiatric: Pt behavior is normal without agitation   Micro: none  Cardiac tracings I have personally interpreted today:  none  Pertinent Radiological findings (summarize): none   Lab Results  Component Value Date   WBC 5.1 04/04/2022   HGB 14.1 04/04/2022   HCT 42.1 04/04/2022   PLT 125.0 (L) 04/04/2022   GLUCOSE 153 (H) 04/04/2022   CHOL 133 04/04/2022   TRIG 150.0 (H) 04/04/2022   HDL 37.00 (L) 04/04/2022   LDLDIRECT 83.0 12/22/2020   LDLCALC 66 04/04/2022   ALT 15 04/04/2022   AST 16 04/04/2022   NA 140 04/04/2022   K 3.3 (L) 04/04/2022   CL 102 04/04/2022   CREATININE 1.26 (H) 04/04/2022   BUN 24 (H) 04/04/2022   CO2 31 04/04/2022   TSH 4.70 12/24/2021   INR 1.0 ratio 03/25/2010   HGBA1C 7.6 (H) 04/04/2022   MICROALBUR 2.9 (H) 12/24/2021   Assessment/Plan:  Carol Lawrence is a 86 y.o. White or Caucasian [1] female with  has a past medical history of Allergic rhinitis, Bell's palsy, Cancer ([place]), Cardiomyopathy, dilated ([place]) (08/26/2011), Carotid stenosis (09/12/2012), CHF (congestive heart failure) ([place]), Clotting disorder ([place]), Colonic polyp, DVT (deep venous thrombosis) (HCC), Glucose intolerance (impaired glucose tolerance), HLD (hyperlipidemia), HTN (  hypertension), NICM (nonischemic cardiomyopathy) ([place]), and PVD (peripheral vascular disease) ([place]).  Finger laceration Laceration intact, stitches removed  Essential hypertension BP Readings from Last 3 Encounters:  08/16/22 132/70  08/06/22 (!) 146/64  04/04/22  110/60   Stable, pt to continue medical treatment coreg 25 bid, chlorthalidone 25 qd     Vitamin D deficiency Last vitamin D Lab Results  Component Value Date   VD25OH 45.39 04/04/2022   Stable, cont oral replacement  Followup: Return if symptoms worsen or fail to improve.  Cathlean Cower, MD 08/17/2022 3:33 PM [facility]

## 2022-08-16 NOTE — Patient Instructions (Signed)
Your stitches were removed today  There is no sign of infection  Please continue all other medications as before, and refills have been done if requested.  Please have the pharmacy call with any other refills you may need.  Please continue your efforts at being more active, low cholesterol diet, and weight control.  Please keep your appointments with your specialists as you may have planned  We'll see you soon in October.

## 2022-08-17 ENCOUNTER — Encounter: Payer: Self-pay | Admitting: Internal Medicine

## 2022-08-17 DIAGNOSIS — S61219A Laceration without foreign body of unspecified finger without damage to nail, initial encounter: Secondary | ICD-10-CM | POA: Insufficient documentation

## 2022-08-17 NOTE — Assessment & Plan Note (Signed)
Laceration intact, stitches removed

## 2022-08-17 NOTE — Assessment & Plan Note (Signed)
Last vitamin D Lab Results  Component Value Date   VD25OH 45.39 04/04/2022   Stable, cont oral replacement

## 2022-08-17 NOTE — Assessment & Plan Note (Signed)
BP Readings from Last 3 Encounters:  08/16/22 132/70  08/06/22 (!) 146/64  04/04/22 110/60   Stable, pt to continue medical treatment coreg 25 bid, chlorthalidone 25 qd

## 2022-08-23 ENCOUNTER — Telehealth: Payer: Self-pay | Admitting: Cardiology

## 2022-08-23 MED ORDER — LISINOPRIL 20 MG PO TABS: 20.0000 mg | ORAL_TABLET | Freq: Two times a day (BID) | ORAL | 2 refills | Status: DC

## 2022-08-23 NOTE — Telephone Encounter (Signed)
*  STAT* If patient is at the pharmacy, call can be transferred to refill team.   1. Which medications need to be refilled? (please list name of each medication and dose if known) lisinopril (ZESTRIL) 20 MG tablet  2. Which pharmacy/location (including street and city if local pharmacy) is medication to be sent to? [id number] [place], [place] - [address]  3. Do they need a 30 day or 90 day supply? 97 Pt has been out of meds since last Thursday

## 2022-09-19 ENCOUNTER — Ambulatory Visit: Payer: Medicare HMO

## 2022-10-03 ENCOUNTER — Ambulatory Visit (INDEPENDENT_AMBULATORY_CARE_PROVIDER_SITE_OTHER): Payer: Medicare HMO

## 2022-10-03 VITALS — Ht 64.0 in | Wt 129.0 lb

## 2022-10-03 DIAGNOSIS — Z0000 Encounter for general adult medical examination without abnormal findings: Secondary | ICD-10-CM

## 2022-10-03 NOTE — Progress Notes (Signed)
Subjective:   Carol Lawrence is a 86 y.o. female who presents for Medicare Annual (Subsequent) preventive examination.   I connected with  Addison Naegeli Lubinski on 10/03/22 by a audio enabled telemedicine application and verified that I am speaking with the correct person using two identifiers.  Patient Location: Home  Provider Location: Home Office  I discussed the limitations of evaluation and management by telemedicine. The patient expressed understanding and agreed to proceed.  Review of Systems     Cardiac Risk Factors include: advanced age (>35mn, >>82women);hypertension     Objective:    Today's Vitals   10/03/22 1339  Weight: 129 lb (58.5 kg)  Height: '5\' 4"'$  (1.626 m)   Body mass index is 22.14 kg/m.     10/03/2022    1:43 PM 08/06/2022    8:44 PM 07/10/2017   11:52 AM 01/06/2016   10:52 AM  Advanced Directives  Does Patient Have a Medical Advance Directive? No No No No  Would patient like information on creating a medical advance directive? No - Patient declined No - Patient declined  No - patient declined information    Current Medications (verified) Outpatient Encounter Medications as of 10/03/2022  Medication Sig   aspirin 81 MG tablet Take 81 mg by mouth daily.   carvedilol (COREG) 25 MG tablet TAKE 1 TABLET BY MOUTH TWICE A DAY   chlorthalidone (HYGROTON) 25 MG tablet TAKE 1 TABLET BY MOUTH EVERY DAY   cholecalciferol (VITAMIN D3) 25 MCG (1000 UT) tablet Take 1,000 Units by mouth daily.   lisinopril (ZESTRIL) 20 MG tablet Take 1 tablet (20 mg total) by mouth 2 (two) times daily.   metFORMIN (GLUCOPHAGE-XR) 500 MG 24 hr tablet TAKE 1 TABLET BY MOUTH EVERY DAY WITH BREAKFAST   pravastatin (PRAVACHOL) 80 MG tablet TAKE 1 TABLET BY MOUTH EVERY DAY   No facility-administered encounter medications on file as of 10/03/2022.    Allergies (verified) Amoxicillin and Codeine   History: Past Medical History:  Diagnosis Date   Allergic rhinitis    Bell's  palsy    Cancer ([facility]    [phone]  Cardiomyopathy, dilated ([facility] 08/26/2011   15% 2011, 55% 2012   Carotid stenosis 09/12/2012   Bilateral, mild Lifeline screening   CHF (congestive heart failure) (HCC)    Clotting disorder (HCC)    Colonic polyp    DVT (deep venous thrombosis) (HCC)    Glucose intolerance (impaired glucose tolerance)    HLD (hyperlipidemia)    HTN (hypertension)    NICM (nonischemic cardiomyopathy) ([facility]    Coronaries; left main essentially was nonexistent or very short with   PVD (peripheral vascular disease) ([facility]    Past Surgical History:  Procedure Laterality Date   COLONOSCOPY     hysterectomy - unknown type     LUMBAR FUSION  1983   OOPHORECTOMY     POLYPECTOMY     TUBAL LIGATION     Family History  Problem Relation Age of Onset   COPD Mother    Osteoporosis Mother    Leukemia Sister    Colon polyps Sister    Colon polyps Brother    Leukemia Sister    Leukemia Other    Coronary artery disease Other    Hyperlipidemia Other    Lung cancer Other    Osteoporosis Other    COPD Other    Melanoma Other    Liver cancer Other    Breast cancer Daughter [phone]  Social History  Socioeconomic History   Marital status: Married    Spouse name: Not on file   Number of children: 2   Years of education: Not on file   Highest education level: Not on file  Occupational History   Occupation: retired    Comment: Manufacturing engineer  Tobacco Use   Smoking status: Former    Types: Cigarettes    Quit date: 12/19/1980    Years since quitting: 41.8   Smokeless tobacco: Never  Vaping Use   Vaping Use: Never used  Substance and Sexual Activity   Alcohol use: No   Drug use: Not Currently   Sexual activity: Yes  Other Topics Concern   Not on file  Social History Narrative   Not on file   Social Determinants of Health   Financial Resource Strain: Low Risk  (10/03/2022)   Overall Financial Resource Strain (CARDIA)    Difficulty of Paying Living  Expenses: Not hard at all  Food Insecurity: No Food Insecurity (10/03/2022)   Hunger Vital Sign    Worried About Running Out of Food in the Last Year: Never true    [place] in the Last Year: Never true  Transportation Needs: No Transportation Needs (10/03/2022)   PRAPARE - Hydrologist (Medical): No    Lack of Transportation (Non-Medical): No  Physical Activity: Sufficiently Active (10/03/2022)   Exercise Vital Sign    Days of Exercise per Week: 5 days    Minutes of Exercise per Session: 30 min  Stress: No Stress Concern Present (10/03/2022)   [place]    Feeling of Stress : Not at all  Social Connections: Moderately Integrated (10/03/2022)   Social Connection and Isolation Panel [NHANES]    Frequency of Communication with Friends and Family: More than three times a week    Frequency of Social Gatherings with Friends and Family: More than three times a week    Attends Religious Services: More than 4 times per year    Active Member of [employer] or Organizations: No    Attends Music therapist: Never    Marital Status: Married    Tobacco Counseling Counseling given: Not Answered   Clinical Intake:  Pre-visit preparation completed: Yes  Pain : No/denies pain     Nutritional Risks: None Diabetes: No  How often do you need to have someone help you when you read instructions, pamphlets, or other written materials from your doctor or pharmacy?: 1 - Never  Diabetic?no   Interpreter Needed?: No  Information entered by :: Jadene Pierini, LPN   Activities of Daily Living    10/03/2022    1:43 PM  In your present state of health, do you have any difficulty performing the following activities:  Hearing? 0  Vision? 0  Difficulty concentrating or making decisions? 0  Walking or climbing stairs? 0  Dressing or bathing? 0  Doing errands, shopping? 0  Preparing  Food and eating ? N  Using the Toilet? N  In the past six months, have you accidently leaked urine? N  Do you have problems with loss of bowel control? N  Managing your Medications? N  Managing your Finances? N  Housekeeping or managing your Housekeeping? N    Patient Care Team: Biagio Borg, MD as PCP - Cheral Bay, MD as PCP - Cardiology (Cardiology)  Indicate any recent Medical Services you may have received from other than [facility] providers  in the past year (date may be approximate).     Assessment:   This is a routine wellness examination for Cor[place].  Hearing/Vision screen Vision Screening - Comments:: Annual eye exams wears glasses  Dietary issues and exercise activities discussed: Current Exercise Habits: Home exercise routine, Type of exercise: walking, Time (Minutes): 30, Frequency (Times/Week): 5, Weekly Exercise (Minutes/Week): 150, Intensity: Mild, Exercise limited by: None identified   Goals Addressed             This Visit's Progress    Exercise 3x per week (30 min per time)         Depression Screen    10/03/2022    1:42 PM 04/04/2022   10:27 AM 04/04/2022    9:50 AM 12/31/2021    4:02 PM 06/24/2021   11:20 AM 12/22/2020    1:31 PM 06/09/2020   11:18 AM  PHQ 2/9 Scores  PHQ - 2 Score 0 0 0 0 0 0 0    Fall Risk    10/03/2022    1:40 PM 04/04/2022   10:26 AM 04/04/2022    9:50 AM 12/31/2021    4:02 PM 06/24/2021   11:20 AM  [place] in the past year? 0 0 0 0 0  Number falls in past yr: 0 0 0 0 0  Injury with Fall? 0 0 0 0 0  Risk for fall due to : No Fall Risks      Follow up Falls prevention discussed        FALL RISK PREVENTION PERTAINING TO THE HOME:  Any stairs in or around the home? No  If so, are there any without handrails? No  Home free of loose throw rugs in walkways, pet beds, electrical cords, etc? Yes  Adequate lighting in your home to reduce risk of falls? Yes   ASSISTIVE DEVICES UTILIZED TO PREVENT  FALLS:  Life alert? No  Use of a cane, walker or w/c? No  Grab bars in the bathroom? No  Shower chair or bench in shower? No  Elevated toilet seat or a handicapped toilet? No        10/03/2022    1:44 PM  6CIT Screen  What Year? 0 points  What month? 0 points  What time? 0 points  Count back from 20 0 points  Months in reverse 0 points  Repeat phrase 0 points  Total Score 0 points    Immunizations Immunization History  Administered Date(s) Administered   Fluad Quad(high Dose 65+) 09/13/2019   Influenza Split 09/14/2011, 09/18/2012   Influenza Whole 10/23/2007, 09/23/2008, 09/29/2009, 09/09/2010   Influenza, High Dose Seasonal PF 09/13/2016, 09/07/2017, 10/11/2018, 09/28/2021   Influenza,inj,Quad PF,6+ Mos 09/17/2013, 09/09/2014, 09/15/2015   Influenza-Unspecified 09/18/2018, 09/30/2020, 10/05/2021   PFIZER Comirnaty(Gray Top)Covid-19 Tri-Sucrose Vaccine 05/25/2021   PFIZER(Purple Top)SARS-COV-2 Vaccination 01/08/2020, 02/04/2020, 09/21/2020   Pneumococcal Conjugate-13 09/17/2013   Pneumococcal Polysaccharide-23 08/19/2005, 08/30/2011   Td 10/19/1998, 01/16/2009   Tdap 08/06/2019   Zoster Recombinat (Shingrix) 02/05/2018, 05/31/2018   Zoster, Live 10/18/2006    TDAP status: Up to date  Flu Vaccine status: Up to date  Pneumococcal vaccine status: Up to date  Covid-19 vaccine status: Completed vaccines  Qualifies for Shingles Vaccine? Yes   Zostavax completed Yes   Shingrix Completed?: Yes  Screening Tests Health Maintenance  Topic Date Due   OPHTHALMOLOGY EXAM  Never done   INFLUENZA VACCINE  03/19/2023 (Originally 07/19/2022)   HEMOGLOBIN A1C  10/04/2022   Diabetic kidney evaluation -  Urine ACR  12/24/2022   FOOT EXAM  12/31/2022   Diabetic kidney evaluation - GFR measurement  04/05/2023   TETANUS/TDAP  08/05/2029   Pneumonia Vaccine 58+ Years old  Completed   DEXA SCAN  Completed   Zoster Vaccines- Shingrix  Completed   HPV VACCINES  Aged Out    COVID-19 Vaccine  Discontinued    Health Maintenance  Health Maintenance Due  Topic Date Due   OPHTHALMOLOGY EXAM  Never done    Colorectal cancer screening: No longer required.   Mammogram status: No longer required due to age.  Bone Density status: Completed 05/19/2016. Results reflect: Bone density results: OSTEOPENIA. Repeat every 5 years.  Lung Cancer Screening: (Low Dose CT Chest recommended if Age 44-80 years, 30 pack-year currently smoking OR have quit w/in 15years.) does not qualify.   Lung Cancer Screening Referral: n/a  Additional Screening:  Hepatitis C Screening: does not qualify;   Vision Screening: Recommended annual ophthalmology exams for early detection of glaucoma and other disorders of the eye. Is the patient up to date with their annual eye exam?  Yes  Who is the provider or what is the name of the office in which the patient attends annual eye exams? [facility]  If pt is not established with a provider, would they like to be referred to a provider to establish care? No .   Dental Screening: Recommended annual dental exams for proper oral hygiene  Community Resource Referral / Chronic Care Management: CRR required this visit?  No   CCM required this visit?  No      Plan:     I have personally reviewed and noted the following in the patient's chart:   Medical and social history Use of alcohol, tobacco or illicit drugs  Current medications and supplements including opioid prescriptions. Patient is not currently taking opioid prescriptions. Functional ability and status Nutritional status Physical activity Advanced directives List of other physicians Hospitalizations, surgeries, and ER visits in previous 12 months Vitals Screenings to include cognitive, depression, and falls Referrals and appointments  In addition, I have reviewed and discussed with patient certain preventive protocols, quality metrics, and best practice recommendations. A  written personalized care plan for preventive services as well as general preventive health recommendations were provided to patient.     Daphane Shepherd, LPN   [id number]   Nurse Notes: None

## 2022-10-03 NOTE — Patient Instructions (Signed)
Carol Lawrence , Thank you for taking time to come for your Medicare Wellness Visit. I appreciate your ongoing commitment to your health goals. Please review the following plan we discussed and let me know if I can assist you in the future.   These are the goals we discussed:  Goals      Exercise 3x per week (30 min per time)        This is a list of the screening recommended for you and due dates:  Health Maintenance  Topic Date Due   Eye exam for diabetics  Never done   Flu Shot  03/19/2023*   Hemoglobin A1C  10/04/2022   Yearly kidney health urinalysis for diabetes  12/24/2022   Complete foot exam   12/31/2022   Yearly kidney function blood test for diabetes  04/05/2023   Tetanus Vaccine  08/05/2029   Pneumonia Vaccine  Completed   DEXA scan (bone density measurement)  Completed   Zoster (Shingles) Vaccine  Completed   HPV Vaccine  Aged Out   COVID-19 Vaccine  Discontinued  *Topic was postponed. The date shown is not the original due date.    Advanced directives: Advance directive discussed with you today. I have provided a copy for you to complete at home and have notarized. Once this is complete please bring a copy in to our office so we can scan it into your chart.   Conditions/risks identified: Aim for 30 minutes of exercise or brisk walking, 6-8 glasses of water, and 5 servings of fruits and vegetables each day.   Next appointment: Follow up in one year for your annual wellness visit    Preventive Care 65 Years and Older, Female Preventive care refers to lifestyle choices and visits with your health care provider that can promote health and wellness. What does preventive care include? A yearly physical exam. This is also called an annual well check. Dental exams once or twice a year. Routine eye exams. Ask your health care provider how often you should have your eyes checked. Personal lifestyle choices, including: Daily care of your teeth and gums. Regular physical  activity. Eating a healthy diet. Avoiding tobacco and drug use. Limiting alcohol use. Practicing safe sex. Taking low-dose aspirin every day. Taking vitamin and mineral supplements as recommended by your health care provider. What happens during an annual well check? The services and screenings done by your health care provider during your annual well check will depend on your age, overall health, lifestyle risk factors, and family history of disease. Counseling  Your health care provider may ask you questions about your: Alcohol use. Tobacco use. Drug use. Emotional well-being. Home and relationship well-being. Sexual activity. Eating habits. History of falls. Memory and ability to understand (cognition). Work and work Statistician. Reproductive health. Screening  You may have the following tests or measurements: Height, weight, and BMI. Blood pressure. Lipid and cholesterol levels. These may be checked every 5 years, or more frequently if you are over 82 years old. Skin check. Lung cancer screening. You may have this screening every year starting at age 43 if you have a 30-pack-year history of smoking and currently smoke or have quit within the past 15 years. Fecal occult blood test (FOBT) of the stool. You may have this test every year starting at age 7. Flexible sigmoidoscopy or colonoscopy. You may have a sigmoidoscopy every 5 years or a colonoscopy every 10 years starting at age 77. Hepatitis C blood test. Hepatitis B blood test. Sexually  transmitted disease (STD) testing. Diabetes screening. This is done by checking your blood sugar (glucose) after you have not eaten for a while (fasting). You may have this done every 1-3 years. Bone density scan. This is done to screen for osteoporosis. You may have this done starting at age 76. Mammogram. This may be done every 1-2 years. Talk to your health care provider about how often you should have regular mammograms. Talk with your  health care provider about your test results, treatment options, and if necessary, the need for more tests. Vaccines  Your health care provider may recommend certain vaccines, such as: Influenza vaccine. This is recommended every year. Tetanus, diphtheria, and acellular pertussis (Tdap, Td) vaccine. You may need a Td booster every 10 years. Zoster vaccine. You may need this after age 40. Pneumococcal 13-valent conjugate (PCV13) vaccine. One dose is recommended after age 68. Pneumococcal polysaccharide (PPSV23) vaccine. One dose is recommended after age 63. Talk to your health care provider about which screenings and vaccines you need and how often you need them. This information is not intended to replace advice given to you by your health care provider. Make sure you discuss any questions you have with your health care provider. Document Released: 01/01/2016 Document Revised: 08/24/2016 Document Reviewed: 10/06/2015 Elsevier Interactive Patient Education  2017 [employer] Prevention in the Home Falls can cause injuries. They can happen to people of all ages. There are many things you can do to make your home safe and to help prevent falls. What can I do on the outside of my home? Regularly fix the edges of walkways and driveways and fix any cracks. Remove anything that might make you trip as you walk through a door, such as a raised step or threshold. Trim any bushes or trees on the path to your home. Use bright outdoor lighting. Clear any walking paths of anything that might make someone trip, such as rocks or tools. Regularly check to see if handrails are loose or broken. Make sure that both sides of any steps have handrails. Any raised decks and porches should have guardrails on the edges. Have any leaves, snow, or ice cleared regularly. Use sand or salt on walking paths during winter. Clean up any spills in your garage right away. This includes oil or grease spills. What can I  do in the bathroom? Use night lights. Install grab bars by the toilet and in the tub and shower. Do not use towel bars as grab bars. Use non-skid mats or decals in the tub or shower. If you need to sit down in the shower, use a plastic, non-slip stool. Keep the floor dry. Clean up any water that spills on the floor as soon as it happens. Remove soap buildup in the tub or shower regularly. Attach bath mats securely with double-sided non-slip rug tape. Do not have throw rugs and other things on the floor that can make you trip. What can I do in the bedroom? Use night lights. Make sure that you have a light by your bed that is easy to reach. Do not use any sheets or blankets that are too big for your bed. They should not hang down onto the floor. Have a firm chair that has side arms. You can use this for support while you get dressed. Do not have throw rugs and other things on the floor that can make you trip. What can I do in the kitchen? Clean up any spills right away. Avoid  walking on wet floors. Keep items that you use a lot in easy-to-reach places. If you need to reach something above you, use a strong step stool that has a grab bar. Keep electrical cords out of the way. Do not use floor polish or wax that makes floors slippery. If you must use wax, use non-skid floor wax. Do not have throw rugs and other things on the floor that can make you trip. What can I do with my stairs? Do not leave any items on the stairs. Make sure that there are handrails on both sides of the stairs and use them. Fix handrails that are broken or loose. Make sure that handrails are as long as the stairways. Check any carpeting to make sure that it is firmly attached to the stairs. Fix any carpet that is loose or worn. Avoid having throw rugs at the top or bottom of the stairs. If you do have throw rugs, attach them to the floor with carpet tape. Make sure that you have a light switch at the top of the stairs  and the bottom of the stairs. If you do not have them, ask someone to add them for you. What else can I do to help prevent falls? Wear shoes that: Do not have high heels. Have rubber bottoms. Are comfortable and fit you well. Are closed at the toe. Do not wear sandals. If you use a stepladder: Make sure that it is fully opened. Do not climb a closed stepladder. Make sure that both sides of the stepladder are locked into place. Ask someone to hold it for you, if possible. Clearly mark and make sure that you can see: Any grab bars or handrails. First and last steps. Where the edge of each step is. Use tools that help you move around (mobility aids) if they are needed. These include: Canes. Walkers. Scooters. Crutches. Turn on the lights when you go into a dark area. Replace any light bulbs as soon as they burn out. Set up your furniture so you have a clear path. Avoid moving your furniture around. If any of your floors are uneven, fix them. If there are any pets around you, be aware of where they are. Review your medicines with your doctor. Some medicines can make you feel dizzy. This can increase your chance of falling. Ask your doctor what other things that you can do to help prevent falls. This information is not intended to replace advice given to you by your health care provider. Make sure you discuss any questions you have with your health care provider. Document Released: 10/01/2009 Document Revised: 05/12/2016 Document Reviewed: 01/09/2015 Elsevier Interactive Patient Education  2017 [employer].

## 2022-10-05 ENCOUNTER — Ambulatory Visit (INDEPENDENT_AMBULATORY_CARE_PROVIDER_SITE_OTHER): Payer: Medicare HMO | Admitting: Internal Medicine

## 2022-10-05 ENCOUNTER — Encounter: Payer: Self-pay | Admitting: Internal Medicine

## 2022-10-05 ENCOUNTER — Ambulatory Visit: Payer: Medicare HMO | Admitting: Internal Medicine

## 2022-10-05 VITALS — BP 120/62 | HR 62 | Temp 97.9°F | Ht 64.0 in | Wt 131.0 lb

## 2022-10-05 DIAGNOSIS — N1831 Chronic kidney disease, stage 3a: Secondary | ICD-10-CM

## 2022-10-05 DIAGNOSIS — Z23 Encounter for immunization: Secondary | ICD-10-CM

## 2022-10-05 DIAGNOSIS — E1165 Type 2 diabetes mellitus with hyperglycemia: Secondary | ICD-10-CM | POA: Diagnosis not present

## 2022-10-05 DIAGNOSIS — R9431 Abnormal electrocardiogram [ECG] [EKG]: Secondary | ICD-10-CM | POA: Diagnosis not present

## 2022-10-05 DIAGNOSIS — E7800 Pure hypercholesterolemia, unspecified: Secondary | ICD-10-CM

## 2022-10-05 DIAGNOSIS — I10 Essential (primary) hypertension: Secondary | ICD-10-CM | POA: Diagnosis not present

## 2022-10-05 LAB — HEPATIC FUNCTION PANEL
ALT: 16 U/L (ref 0–35)
AST: 16 U/L (ref 0–37)
Albumin: 4.4 g/dL (ref 3.5–5.2)
Alkaline Phosphatase: 40 U/L (ref 39–117)
Bilirubin, Direct: 0.1 mg/dL (ref 0.0–0.3)
Total Bilirubin: 0.6 mg/dL (ref 0.2–1.2)
Total Protein: 7 g/dL (ref 6.0–8.3)

## 2022-10-05 LAB — LIPID PANEL
Cholesterol: 135 mg/dL (ref 0–200)
HDL: 37.2 mg/dL — ABNORMAL LOW (ref >39.00)
LDL Cholesterol: 63 mg/dL (ref 0–99)
NonHDL: 98.14
Total CHOL/HDL Ratio: 4
Triglycerides: 177 mg/dL — ABNORMAL HIGH (ref 0.0–149.0)
VLDL: 35.4 mg/dL (ref 0.0–40.0)

## 2022-10-05 LAB — BASIC METABOLIC PANEL
BUN: 25 mg/dL — ABNORMAL HIGH (ref 6–23)
CO2: 32 mEq/L (ref 19–32)
Calcium: 9.9 mg/dL (ref 8.4–10.5)
Chloride: 99 mEq/L (ref 96–112)
Creatinine, Ser: 1.12 mg/dL (ref 0.40–1.20)
GFR: 45.31 mL/min — ABNORMAL LOW (ref >60.00)
Glucose, Bld: 121 mg/dL — ABNORMAL HIGH (ref 70–99)
Potassium: 3.3 mEq/L — ABNORMAL LOW (ref 3.5–5.1)
Sodium: 138 mEq/L (ref 135–145)

## 2022-10-05 LAB — HEMOGLOBIN A1C: Hgb A1c MFr Bld: 7 % — ABNORMAL HIGH (ref 4.6–6.5)

## 2022-10-05 MED ORDER — DAPAGLIFLOZIN PROPANEDIOL 10 MG PO TABS: 10.0000 mg | ORAL_TABLET | Freq: Every day | ORAL | 3 refills | Status: AC

## 2022-10-05 NOTE — Progress Notes (Signed)
Patient ID: Carol Lawrence, female   DOB: 01-08-1936, 86 y.o.   MRN: [id number]        Chief Complaint: follow up HTN, HLD and DM, CKD 3a       HPI:  Carol Lawrence is a 86 y.o. female here overall doing ok, Pt denies chest pain, increased sob or doe, wheezing, orthopnea, PND, increased LE swelling, palpitations, dizziness or syncope.   Pt denies polydipsia, polyuria, or new focal neuro s/s.   Tolerating increased metformin ER 500 to 2 d well since last visit, but willing to change to farxiga 10 mg   Trying to follow DM diet, willing for cardiac CT score.    Due for fllu shot       Wt Readings from Last 3 Encounters:  10/05/22 131 lb (59.4 kg)  10/03/22 129 lb (58.5 kg)  08/16/22 131 lb (59.4 kg)   BP Readings from Last 3 Encounters:  10/05/22 120/62  08/16/22 132/70  08/06/22 (!) 146/64         Past Medical History:  Diagnosis Date   Allergic rhinitis    Bell's palsy    Cancer ([place])    [id number]   Cardiomyopathy, dilated ([place]) 08/26/2011   15% 2011, 55% 2012   Carotid stenosis 09/12/2012   Bilateral, mild [facility] screening   CHF (congestive heart failure) (HCC)    Clotting disorder (HCC)    Colonic polyp    DVT (deep venous thrombosis) (HCC)    Glucose intolerance (impaired glucose tolerance)    HLD (hyperlipidemia)    HTN (hypertension)    NICM (nonischemic cardiomyopathy) ([place])    Coronaries; left main essentially was nonexistent or very short with   PVD (peripheral vascular disease) ([place])    Past Surgical History:  Procedure Laterality Date   COLONOSCOPY     hysterectomy - unknown type     LUMBAR FUSION  1983   OOPHORECTOMY     POLYPECTOMY     TUBAL LIGATION      reports that she quit smoking about 41 years ago. Her smoking use included cigarettes. She has never used smokeless tobacco. She reports that she does not currently use drugs. She reports that she does not drink alcohol. family history includes Breast cancer (age of onset: 45) in her daughter;  COPD in her mother and another family member; Colon polyps in her brother and sister; Coronary artery disease in an other family member; Hyperlipidemia in an other family member; Leukemia in her sister, sister and another family member; Liver cancer in an other family member; Lung cancer in an other family member; Melanoma in an other family member; Osteoporosis in her mother and another family member. Allergies  Allergen Reactions   Amoxicillin Itching    Curam---from [place]   Codeine Itching   Current Outpatient Medications on File Prior to Visit  Medication Sig Dispense Refill   aspirin 81 MG tablet Take 81 mg by mouth daily.     carvedilol (COREG) 25 MG tablet TAKE 1 TABLET BY MOUTH TWICE A DAY 180 tablet 3   chlorthalidone (HYGROTON) 25 MG tablet TAKE 1 TABLET BY MOUTH EVERY DAY 90 tablet 3   cholecalciferol (VITAMIN D3) 25 MCG (1000 UT) tablet Take 1,000 Units by mouth daily.     lisinopril (ZESTRIL) 20 MG tablet Take 1 tablet (20 mg total) by mouth 2 (two) times daily. 180 tablet 2   pravastatin (PRAVACHOL) 80 MG tablet TAKE 1 TABLET BY MOUTH EVERY DAY 90 tablet 3  No current facility-administered medications on file prior to visit.        ROS:  All others reviewed and negative.  Objective        PE:  BP 120/62 (BP Location: Left Arm, Patient Position: Sitting, Cuff Size: Large)   Pulse 62   Temp 97.9 F (36.6 C) (Oral)   Ht '5\' 4"'$  (1.626 m)   Wt 131 lb (59.4 kg)   SpO2 98%   BMI 22.49 kg/m                 Constitutional: Pt appears in NAD               HENT: Head: NCAT.                Right Ear: External ear normal.                 Left Ear: External ear normal.                Eyes: . Pupils are equal, round, and reactive to light. Conjunctivae and EOM are normal               Nose: without d/c or deformity               Neck: Neck supple. Gross normal ROM               Cardiovascular: Normal rate and regular rhythm.                 Pulmonary/Chest: Effort normal and  breath sounds without rales or wheezing.                Abd:  Soft, NT, ND, + BS, no organomegaly               Neurological: Pt is alert. At baseline orientation, motor grossly intact               Skin: Skin is warm. No rashes, no other new lesions, LE edema - none               Psychiatric: Pt behavior is normal without agitation   Micro: none  Cardiac tracings I have personally interpreted today:  none  Pertinent Radiological findings (summarize): none   Lab Results  Component Value Date   WBC 5.1 04/04/2022   HGB 14.1 04/04/2022   HCT 42.1 04/04/2022   PLT 125.0 (L) 04/04/2022   GLUCOSE 121 (H) 10/05/2022   CHOL 135 10/05/2022   TRIG 177.0 (H) 10/05/2022   HDL 37.20 (L) 10/05/2022   LDLDIRECT 83.0 12/22/2020   LDLCALC 63 10/05/2022   ALT 16 10/05/2022   AST 16 10/05/2022   NA 138 10/05/2022   K 3.3 (L) 10/05/2022   CL 99 10/05/2022   CREATININE 1.12 10/05/2022   BUN 25 (H) 10/05/2022   CO2 32 10/05/2022   TSH 4.70 12/24/2021   INR 1.0 ratio 03/25/2010   HGBA1C 7.0 (H) 10/05/2022   MICROALBUR 2.9 (H) 12/24/2021   Assessment/Plan:  Carol Lawrence is a 86 y.o. White or Caucasian [1] female with  has a past medical history of Allergic rhinitis, Bell's palsy, Cancer ([place]), Cardiomyopathy, dilated ([place]) (08/26/2011), Carotid stenosis (09/12/2012), CHF (congestive heart failure) ([place]), Clotting disorder ([place]), Colonic polyp, DVT (deep venous thrombosis) (HCC), Glucose intolerance (impaired glucose tolerance), HLD (hyperlipidemia), HTN (hypertension), NICM (nonischemic cardiomyopathy) ([place]), and PVD (peripheral vascular disease) ([place]).  HLD (hyperlipidemia) Lab Results  Component Value Date  [place] 63 10/05/2022   Stable, pt to continue current statin pravachol 80 mg qd, also for Cardiac CT score   Essential hypertension BP Readings from Last 3 Encounters:  10/05/22 120/62  08/16/22 132/70  08/06/22 (!) 146/64   Stable, pt to continue medical treatment coreg 25  mg qd, chlorthalidone 25 mg qd, lisinopril 20 mg qd   Diabetes (HCC) Lab Results  Component Value Date   HGBA1C 7.0 (H) 10/05/2022   Mild uncontrolled, pt to change the metformin ER to farxiga 10 mg qd   CKD (chronic kidney disease) stage 3, GFR 30-59 ml/min Lab Results  Component Value Date   CREATININE 1.12 10/05/2022   Stable overall, cont to avoid nephrotoxins  Followup: No follow-ups on file.  Cathlean Cower, MD 10/08/2022 7:44 PM [facility]

## 2022-10-05 NOTE — Patient Instructions (Addendum)
You had the flu shot today  Ok to STOP and change the metformin to [place] 10 mg per day if the cost is ok  Please continue all other medications as before, and refills have been done if requested.  Please have the pharmacy call with any other refills you may need.  Please continue your efforts at being more active, low cholesterol diet, and weight control.  Please keep your appointments with your specialists as you may have planned  You will be contacted regarding the referral for: Cardiac CT score  Please go to the LAB at the blood drawing area for the tests to be done  You will be contacted by phone if any changes need to be made immediately.  Otherwise, you will receive a letter about your results with an explanation, but please check with MyChart first.  Please remember to sign up for MyChart if you have not done so, as this will be important to you in the future with finding out test results, communicating by private email, and scheduling acute appointments online when needed.  Please make an Appointment to return in 6 months, or sooner if needed

## 2022-10-07 ENCOUNTER — Telehealth: Payer: Self-pay | Admitting: Internal Medicine

## 2022-10-07 ENCOUNTER — Ambulatory Visit
Admission: RE | Admit: 2022-10-07 | Discharge: 2022-10-07 | Disposition: A | Discharge status: Home / Self Care | Payer: Medicare HMO | Source: Ambulatory Visit | Attending: Obstetrics and Gynecology | Admitting: Obstetrics and Gynecology

## 2022-10-07 DIAGNOSIS — Z1231 Encounter for screening mammogram for malignant neoplasm of breast: Secondary | ICD-10-CM

## 2022-10-07 NOTE — Telephone Encounter (Signed)
PT calls today in regards to onset of UTI symptoms. PT has been dealing with pain while urinating since yesterday morning, progressively worse this morning. States dark coloration of urine but has been able to continue urinating (albeit with pain).   PT is requesting a order for urinalysis be put in and a possible RX to treat this.  CB: [phone]  She stated she took an at home test that was showing "purple in the Leukocytes"

## 2022-10-08 NOTE — Assessment & Plan Note (Signed)
BP Readings from Last 3 Encounters:  10/05/22 120/62  08/16/22 132/70  08/06/22 (!) 146/64   Stable, pt to continue medical treatment coreg 25 mg qd, chlorthalidone 25 mg qd, lisinopril 20 mg qd

## 2022-10-08 NOTE — Assessment & Plan Note (Signed)
Lab Results  Component Value Date   CREATININE 1.12 10/05/2022   Stable overall, cont to avoid nephrotoxins

## 2022-10-08 NOTE — Assessment & Plan Note (Signed)
Lab Results  Component Value Date   LDLCALC 63 10/05/2022   Stable, pt to continue current statin pravachol 80 mg qd, also for Cardiac CT score

## 2022-10-08 NOTE — Assessment & Plan Note (Signed)
Lab Results  Component Value Date   HGBA1C 7.0 (H) 10/05/2022   Mild uncontrolled, pt to change the metformin ER to farxiga 10 mg qd

## 2022-10-11 ENCOUNTER — Other Ambulatory Visit: Payer: Self-pay | Admitting: Internal Medicine

## 2022-10-11 ENCOUNTER — Ambulatory Visit: Payer: Medicare HMO | Admitting: Internal Medicine

## 2022-10-11 DIAGNOSIS — R300 Dysuria: Secondary | ICD-10-CM

## 2022-10-11 NOTE — Telephone Encounter (Signed)
We cant really do antibiotic without an exam, but we can order the lab testing - done

## 2022-10-11 NOTE — Telephone Encounter (Signed)
Patient declines office visit but is requesting abx preferably cipro to clear up symptoms and if that does not help then she will come in.

## 2022-10-11 NOTE — Telephone Encounter (Signed)
Left message for a call back.

## 2022-10-17 ENCOUNTER — Ambulatory Visit (HOSPITAL_COMMUNITY)
Admission: RE | Admit: 2022-10-17 | Discharge: 2022-10-17 | Disposition: A | Discharge status: Home / Self Care | Payer: Medicare HMO | Source: Ambulatory Visit | Attending: Internal Medicine | Admitting: Internal Medicine

## 2022-10-17 DIAGNOSIS — R9431 Abnormal electrocardiogram [ECG] [EKG]: Secondary | ICD-10-CM | POA: Insufficient documentation

## 2022-10-19 ENCOUNTER — Other Ambulatory Visit: Payer: Self-pay | Admitting: Internal Medicine

## 2022-10-19 ENCOUNTER — Encounter: Payer: Self-pay | Admitting: Internal Medicine

## 2022-10-19 ENCOUNTER — Telehealth: Payer: Self-pay | Admitting: Internal Medicine

## 2022-10-19 DIAGNOSIS — R931 Abnormal findings on diagnostic imaging of heart and coronary circulation: Secondary | ICD-10-CM

## 2022-10-19 NOTE — Telephone Encounter (Signed)
Patient called about message below:  Biagio Borg, MD  10/19/2022  3:15 PM EDT     The test results show that your current treatment is OK, except the Cardiac CT score is moderately high, even for your age.  We should continue your current medications, but also refer to Cardiology., and hopefully you will hear soon.    There is no other need for change of treatment or further evaluation based on these results, at this time.  thanks   She wanted to know what moderately high meant, call back is [phone]

## 2022-10-20 DIAGNOSIS — Z1272 Encounter for screening for malignant neoplasm of vagina: Secondary | ICD-10-CM | POA: Diagnosis not present

## 2022-10-20 DIAGNOSIS — Z6822 Body mass index (BMI) 22.0-22.9, adult: Secondary | ICD-10-CM | POA: Diagnosis not present

## 2022-10-20 DIAGNOSIS — Z01419 Encounter for gynecological examination (general) (routine) without abnormal findings: Secondary | ICD-10-CM | POA: Diagnosis not present

## 2022-10-20 NOTE — Telephone Encounter (Signed)
I dont know how to explain more simply than that

## 2022-10-20 NOTE — Telephone Encounter (Signed)
Patient would like clarification of moderately high, please advise.

## 2022-10-31 NOTE — Progress Notes (Unsigned)
Cardiology Office Note   Date:  11/02/2022   ID:  Carol Lawrence, DOB [birth year], MRN [id number]  PCP:  Biagio Borg, MD  Cardiologist:   Minus Breeding, MD   Chief Complaint  Patient presents with   Carol Lawrence      History of Present Illness: Carol Lawrence is a 86 y.o. female who presents for followup of her cardiomyopathy. Her EF improved from a low of 15% to 55%.  However, her EF was slightly lower at 45% in 2016 on follow up echo. This was 50 - 55% in 2022.  She had mild MR.  Since I last saw her she has done okay.  Has no new cardiovascular complaints.  She works at the [employer].  The patient denies any new symptoms such as chest discomfort, neck or arm discomfort. There has been no new shortness of breath, PND or orthopnea. There have been no reported palpitations, presyncope or syncope.     Past Medical History:  Diagnosis Date   Allergic rhinitis    Bell's palsy    Cancer ([place])    [phone]   Cardiomyopathy, dilated ([place]) 08/26/2011   15% 2011, 55% 2012   Carotid stenosis 09/12/2012   Bilateral, mild Lifeline screening   CHF (congestive heart failure) (HCC)    Clotting disorder ([place])    Colonic polyp    DVT (deep venous thrombosis) (HCC)    Glucose intolerance (impaired glucose tolerance)    HLD (hyperlipidemia)    HTN (hypertension)    NICM (nonischemic cardiomyopathy) ([place])    Coronaries; left main essentially was nonexistent or very short with   PVD (peripheral vascular disease) ([place])     Past Surgical History:  Procedure Laterality Date   COLONOSCOPY     hysterectomy - unknown type     LUMBAR FUSION  1983   OOPHORECTOMY     POLYPECTOMY     TUBAL LIGATION       Current Outpatient Medications  Medication Sig Dispense Refill   aspirin 81 MG tablet Take 81 mg by mouth daily.     carvedilol (COREG) 25 MG tablet TAKE 1 TABLET BY MOUTH TWICE A DAY 180 tablet 3   chlorthalidone (HYGROTON) 25 MG tablet TAKE 1 TABLET BY MOUTH EVERY DAY 90  tablet 3   cholecalciferol (VITAMIN D3) 25 MCG (1000 UT) tablet Take 1,000 Units by mouth daily.     dapagliflozin propanediol (FARXIGA) 10 MG TABS tablet Take 1 tablet (10 mg total) by mouth daily before breakfast. 90 tablet 3   lisinopril (ZESTRIL) 20 MG tablet Take 1 tablet (20 mg total) by mouth 2 (two) times daily. 180 tablet 2   metFORMIN (GLUCOPHAGE-XR) 500 MG 24 hr tablet TAKE 2 TABLETS BY MOUTH EVERY DAY WITH BREAKFAST     pravastatin (PRAVACHOL) 80 MG tablet TAKE 1 TABLET BY MOUTH EVERY DAY 90 tablet 3   No current facility-administered medications for this visit.    Allergies:   Amoxicillin and Codeine   ROS:  Please see the history of present illness.   Otherwise, review of systems are positive for none.   All other systems are reviewed and negative.    PHYSICAL EXAM: VS:  BP (!) 106/42   Ht '5\' 4"'$  (1.626 m)   Wt 128 lb (58.1 kg)   BMI 21.97 kg/m  , BMI Body mass index is 21.97 kg/m.  GENERAL:  Well appearing NECK:  No jugular venous distention, waveform within normal limits, carotid upstroke brisk and  symmetric, no bruits, no thyromegaly LUNGS:  Clear to auscultation bilaterally CHEST:  Unremarkable HEART:  PMI not displaced or sustained,S1 and S2 within normal limits, no S3, no S4, no clicks, no rubs, no murmurs ABD:  Flat, positive bowel sounds normal in frequency in pitch, no bruits, no rebound, no guarding, no midline pulsatile mass, no hepatomegaly, no splenomegaly EXT:  2 plus pulses throughout, no edema, no cyanosis no clubbing  EKG:  EKG is  ordered today. The ekg ordered today demonstrates sinus bradycardia, rate 56 she could feel the PA in 3 months occasional junctional escape beat, questionable sinoatrial exit block.   Recent Labs: 12/24/2021: TSH 4.70 04/04/2022: Hemoglobin 14.1; Platelets 125.0 10/05/2022: ALT 16; BUN 25; Creatinine, Ser 1.12; Potassium 3.3; Sodium 138    Lipid Panel    Component Value Date/Time   CHOL 135 10/05/2022 1134   TRIG 177.0  (H) 10/05/2022 1134   HDL 37.20 (L) 10/05/2022 1134   CHOLHDL 4 10/05/2022 1134   VLDL 35.4 10/05/2022 1134   LDLCALC 63 10/05/2022 1134   LDLDIRECT 83.0 12/22/2020 1402      Wt Readings from Last 3 Encounters:  11/02/22 128 lb (58.1 kg)  10/05/22 131 lb (59.4 kg)  10/03/22 129 lb (58.5 kg)      Other studies Reviewed: Additional studies/ records that were reviewed today include: Labs. Review of the above records demonstrates:  Please see elsewhere in the note.     ASSESSMENT AND PLAN:    NICM:  EF was 50 to 55% 2 years ago.  I will follow-up with an echo as below.   Moderate MR: This was mild on echo 2 years ago.  I would like to repeat an echocardiogram given her past history and this MR.   Hypertension:    The blood pressure is managed in the context of treating her previous cardiomyopathy.   CKD IIIA:   Creatinine is 1.12 in October.  No change in therapy.  1.25.  This has been unchanged.  No change in therapy.   Current medicines are reviewed at length with the patient today.  The patient does not have concerns regarding medicines.  The following changes have been made:  None  Labs/ tests ordered today include: None  Orders Placed This Encounter  Procedures   EKG 12-Lead     Disposition:   FU with me in 12 months    Signed, Minus Breeding, MD  11/02/2022 2:54 PM    [facility]

## 2022-11-02 ENCOUNTER — Encounter: Payer: Self-pay | Admitting: Cardiology

## 2022-11-02 ENCOUNTER — Ambulatory Visit: Payer: Medicare HMO | Attending: Cardiology | Admitting: Cardiology

## 2022-11-02 VITALS — BP 106/42 | Ht 64.0 in | Wt 128.0 lb

## 2022-11-02 DIAGNOSIS — N1831 Chronic kidney disease, stage 3a: Secondary | ICD-10-CM | POA: Diagnosis not present

## 2022-11-02 DIAGNOSIS — I340 Nonrheumatic mitral (valve) insufficiency: Secondary | ICD-10-CM | POA: Diagnosis not present

## 2022-11-02 DIAGNOSIS — I428 Other cardiomyopathies: Secondary | ICD-10-CM | POA: Diagnosis not present

## 2022-11-02 DIAGNOSIS — R001 Bradycardia, unspecified: Secondary | ICD-10-CM

## 2022-11-02 NOTE — Patient Instructions (Signed)
Medication Instructions:   No changes   *If you need a refill on your cardiac medications before your next appointment, please call your pharmacy*   Lab Work:  Not needed     Testing/Procedures:  Not needed  Follow-Up: At [facility], you and your health needs are our priority.  As part of our continuing mission to provide you with exceptional heart care, we have created designated Provider Care Teams.  These Care Teams include your primary Cardiologist (physician) and Advanced Practice Providers (APPs -  Physician Assistants and Nurse Practitioners) who all work together to provide you with the care you need, when you need it.  We recommend signing up for the patient portal called "MyChart".  Sign up information is provided on this After Visit Summary.  MyChart is used to connect with patients for Virtual Visits (Telemedicine).  Patients are able to view lab/test results, encounter notes, upcoming appointments, etc.  Non-urgent messages can be sent to your provider as well.   To learn more about what you can do with MyChart, go to [email or web address].    Your next appointment:   12 month(s)  The format for your next appointment:   In Person  Provider:   Minus Breeding, MD

## 2023-01-11 ENCOUNTER — Other Ambulatory Visit: Payer: Self-pay | Admitting: Cardiology

## 2023-01-26 ENCOUNTER — Other Ambulatory Visit: Payer: Self-pay | Admitting: Cardiology

## 2023-02-13 ENCOUNTER — Other Ambulatory Visit: Payer: Self-pay | Admitting: Cardiology

## 2023-02-21 ENCOUNTER — Telehealth: Payer: Self-pay | Admitting: Internal Medicine

## 2023-02-21 MED ORDER — NIRMATRELVIR/RITONAVIR (PAXLOVID) TABLET (RENAL DOSING): 2.0000 | ORAL_TABLET | Freq: Two times a day (BID) | ORAL | 0 refills | Status: AC

## 2023-02-21 MED ORDER — GUAIFENESIN-DM 100-10 MG/5ML PO SYRP: 5.0000 mL | ORAL_SOLUTION | ORAL | 0 refills | Status: DC | PRN

## 2023-02-21 NOTE — Telephone Encounter (Signed)
Please call pt back with advice.

## 2023-02-21 NOTE — Telephone Encounter (Signed)
Pt called in just tested for positive for COVID just about 30 mins ago. Pt inquiring about medication and wanted to know what she all need to do. Pt is also worried having a really bad case of RSV.

## 2023-02-21 NOTE — Telephone Encounter (Signed)
Notified pt w/MD response.../lmb 

## 2023-02-21 NOTE — Addendum Note (Signed)
Addended by: Biagio Borg on: 02/21/2023 04:41 PM   Modules accepted: Orders

## 2023-02-21 NOTE — Telephone Encounter (Signed)
Ok for paxlovid and cough med prn- I will send erx

## 2023-02-23 ENCOUNTER — Other Ambulatory Visit: Payer: Self-pay | Admitting: Internal Medicine

## 2023-03-27 ENCOUNTER — Other Ambulatory Visit: Payer: Self-pay | Admitting: Cardiology

## 2023-03-29 ENCOUNTER — Telehealth: Payer: Self-pay | Admitting: Internal Medicine

## 2023-03-29 MED ORDER — METFORMIN HCL ER 500 MG PO TB24: 500.0000 mg | ORAL_TABLET | Freq: Two times a day (BID) | ORAL | 1 refills | Status: DC

## 2023-03-29 NOTE — Telephone Encounter (Signed)
Patient needs a refill on  her metformin - She has 0 pills left.  Dr. Jonny Ruiz had upped the dosage so she has run out already.  Please call this in to [facility] on [address]  Patient is requesting a call back concerning this rx  Patient's number:  [phone]

## 2023-03-29 NOTE — Telephone Encounter (Signed)
Called pt to verify dosage. Pt states she take 500 mg MD increased to twice a day. Inform pt will send [facility] to [facility],.../lmb

## 2023-04-18 ENCOUNTER — Encounter: Payer: Medicare HMO | Admitting: Internal Medicine

## 2023-04-19 ENCOUNTER — Encounter: Payer: Medicare HMO | Admitting: Internal Medicine

## 2023-05-03 ENCOUNTER — Ambulatory Visit (INDEPENDENT_AMBULATORY_CARE_PROVIDER_SITE_OTHER): Payer: Medicare HMO | Admitting: Internal Medicine

## 2023-05-03 ENCOUNTER — Encounter: Payer: Self-pay | Admitting: Internal Medicine

## 2023-05-03 VITALS — BP 128/72 | HR 68 | Temp 98.0°F | Ht 64.0 in | Wt 130.4 lb

## 2023-05-03 DIAGNOSIS — E7800 Pure hypercholesterolemia, unspecified: Secondary | ICD-10-CM | POA: Diagnosis not present

## 2023-05-03 DIAGNOSIS — I10 Essential (primary) hypertension: Secondary | ICD-10-CM

## 2023-05-03 DIAGNOSIS — E559 Vitamin D deficiency, unspecified: Secondary | ICD-10-CM

## 2023-05-03 DIAGNOSIS — Z7984 Long term (current) use of oral hypoglycemic drugs: Secondary | ICD-10-CM | POA: Diagnosis not present

## 2023-05-03 DIAGNOSIS — E538 Deficiency of other specified B group vitamins: Secondary | ICD-10-CM

## 2023-05-03 DIAGNOSIS — N1831 Chronic kidney disease, stage 3a: Secondary | ICD-10-CM

## 2023-05-03 DIAGNOSIS — R7989 Other specified abnormal findings of blood chemistry: Secondary | ICD-10-CM | POA: Diagnosis not present

## 2023-05-03 DIAGNOSIS — G6289 Other specified polyneuropathies: Secondary | ICD-10-CM | POA: Diagnosis not present

## 2023-05-03 DIAGNOSIS — E1165 Type 2 diabetes mellitus with hyperglycemia: Secondary | ICD-10-CM

## 2023-05-03 DIAGNOSIS — Z0001 Encounter for general adult medical examination with abnormal findings: Secondary | ICD-10-CM

## 2023-05-03 LAB — CBC WITH DIFFERENTIAL/PLATELET
Basophils Absolute: 0 10*3/uL (ref 0.0–0.1)
Basophils Relative: 0.6 % (ref 0.0–3.0)
Eosinophils Absolute: 0.1 10*3/uL (ref 0.0–0.7)
Eosinophils Relative: 2.7 % (ref 0.0–5.0)
HCT: 43 % (ref 36.0–46.0)
Hemoglobin: 14.4 g/dL (ref 12.0–15.0)
Lymphocytes Relative: 20.6 % (ref 12.0–46.0)
Lymphs Abs: 1 10*3/uL (ref 0.7–4.0)
MCHC: 33.5 g/dL (ref 30.0–36.0)
MCV: 90.2 fl (ref 78.0–100.0)
Monocytes Absolute: 0.4 10*3/uL (ref 0.1–1.0)
Monocytes Relative: 8.6 % (ref 3.0–12.0)
Neutro Abs: 3.4 10*3/uL (ref 1.4–7.7)
Neutrophils Relative %: 67.5 % (ref 43.0–77.0)
Platelets: 138 10*3/uL — ABNORMAL LOW (ref 150.0–400.0)
RBC: 4.76 Mil/uL (ref 3.87–5.11)
RDW: 14 % (ref 11.5–15.5)
WBC: 5.1 10*3/uL (ref 4.0–10.5)

## 2023-05-03 LAB — VITAMIN B12: Vitamin B-12: 337 pg/mL (ref 211–911)

## 2023-05-03 LAB — URINALYSIS, ROUTINE W REFLEX MICROSCOPIC
Bilirubin Urine: NEGATIVE
Hgb urine dipstick: NEGATIVE
Ketones, ur: NEGATIVE
Leukocytes,Ua: NEGATIVE
Nitrite: NEGATIVE
RBC / HPF: NONE SEEN (ref 0–?)
Specific Gravity, Urine: 1.02 (ref 1.000–1.030)
Total Protein, Urine: NEGATIVE
Urine Glucose: 100 — AB
Urobilinogen, UA: 0.2 (ref 0.0–1.0)
pH: 6.5 (ref 5.0–8.0)

## 2023-05-03 LAB — LIPID PANEL
Cholesterol: 135 mg/dL (ref 0–200)
HDL: 36.7 mg/dL — ABNORMAL LOW (ref >39.00)
LDL Cholesterol: 62 mg/dL (ref 0–99)
NonHDL: 98.34
Total CHOL/HDL Ratio: 4
Triglycerides: 180 mg/dL — ABNORMAL HIGH (ref 0.0–149.0)
VLDL: 36 mg/dL (ref 0.0–40.0)

## 2023-05-03 LAB — VITAMIN D 25 HYDROXY (VIT D DEFICIENCY, FRACTURES): VITD: 50.83 ng/mL (ref 30.00–100.00)

## 2023-05-03 LAB — TSH: TSH: 5.6 u[IU]/mL — ABNORMAL HIGH (ref 0.35–5.50)

## 2023-05-03 LAB — MICROALBUMIN / CREATININE URINE RATIO
Creatinine,U: 65.5 mg/dL
Microalb Creat Ratio: 2.6 mg/g (ref 0.0–30.0)
Microalb, Ur: 1.7 mg/dL (ref 0.0–1.9)

## 2023-05-03 LAB — BASIC METABOLIC PANEL
BUN: 24 mg/dL — ABNORMAL HIGH (ref 6–23)
CO2: 32 mEq/L (ref 19–32)
Calcium: 9.8 mg/dL (ref 8.4–10.5)
Chloride: 101 mEq/L (ref 96–112)
Creatinine, Ser: 1.18 mg/dL (ref 0.40–1.20)
GFR: 42.39 mL/min — ABNORMAL LOW (ref >60.00)
Glucose, Bld: 128 mg/dL — ABNORMAL HIGH (ref 70–99)
Potassium: 3.8 mEq/L (ref 3.5–5.1)
Sodium: 141 mEq/L (ref 135–145)

## 2023-05-03 LAB — HEPATIC FUNCTION PANEL
ALT: 16 U/L (ref 0–35)
AST: 18 U/L (ref 0–37)
Albumin: 4.2 g/dL (ref 3.5–5.2)
Alkaline Phosphatase: 38 U/L — ABNORMAL LOW (ref 39–117)
Bilirubin, Direct: 0.1 mg/dL (ref 0.0–0.3)
Total Bilirubin: 0.5 mg/dL (ref 0.2–1.2)
Total Protein: 7 g/dL (ref 6.0–8.3)

## 2023-05-03 LAB — HEMOGLOBIN A1C: Hgb A1c MFr Bld: 6.9 % — ABNORMAL HIGH (ref 4.6–6.5)

## 2023-05-03 NOTE — Progress Notes (Signed)
Patient ID: Carol Lawrence, female   DOB: 09-Jul-1936, 87 y.o.   MRN: [id number]         Chief Complaint:: wellness exam and Foot  (Having tingling sensation and numbness about feet )  , dm, hld, htn, ckd, low vit d       HPI:  Carol Lawrence is a 87 y.o. female here for wellness exam; up to date                        Also Pt denies chest pain, increased sob or doe, wheezing, orthopnea, PND, increased LE swelling, palpitations, dizziness or syncope.   Pt denies polydipsia, polyuria, or new focal neuro s/s, except for mild to mod worsening numbness both feet in past 6 mo, no pain or sweling or ulcer.   Pt denies polydipsia, polyuria, or new focal neuro s/s.    Pt denies fever, wt loss, night sweats, loss of appetite, or other constitutional symptoms     Wt Readings from Last 3 Encounters:  05/03/23 130 lb 6 oz (59.1 kg)  11/02/22 128 lb (58.1 kg)  10/05/22 131 lb (59.4 kg)   BP Readings from Last 3 Encounters:  05/03/23 128/72  11/02/22 (!) 106/42  10/05/22 120/62   Immunization History  Administered Date(s) Administered   Fluad Quad(high Dose 65+) 09/13/2019, 10/05/2022   Influenza Split 09/14/2011, 09/18/2012   Influenza Whole 10/23/2007, 09/23/2008, 09/29/2009, 09/09/2010   Influenza, High Dose Seasonal PF 09/13/2016, 09/07/2017, 10/11/2018, 09/28/2021   Influenza,inj,Quad PF,6+ Mos 09/17/2013, 09/09/2014, 09/15/2015   Influenza-Unspecified 09/18/2018, 09/30/2020, 10/05/2021   PFIZER Comirnaty(Gray Top)Covid-19 Tri-Sucrose Vaccine 05/25/2021   PFIZER(Purple Top)SARS-COV-2 Vaccination 01/08/2020, 02/04/2020, 09/21/2020   Pneumococcal Conjugate-13 09/17/2013   Pneumococcal Polysaccharide-23 08/19/2005, 08/30/2011   Td 10/19/1998, 01/16/2009   Tdap 08/06/2019   Zoster Recombinat (Shingrix) 02/05/2018, 05/31/2018   Zoster, Live 10/18/2006   There are no preventive care reminders to display for this patient.     Past Medical History:  Diagnosis Date   Allergic rhinitis     Bell's palsy    Cancer (HCC)    [phone]   Cardiomyopathy, dilated (HCC) 08/26/2011   15% 2011, 55% 2012   Carotid stenosis 09/12/2012   Bilateral, mild Lifeline screening   CHF (congestive heart failure) (HCC)    Clotting disorder (HCC)    Colonic polyp    DVT (deep venous thrombosis) (HCC)    Glucose intolerance (impaired glucose tolerance)    HLD (hyperlipidemia)    HTN (hypertension)    NICM (nonischemic cardiomyopathy) (HCC)    Coronaries; left main essentially was nonexistent or very short with   PVD (peripheral vascular disease) (HCC)    Past Surgical History:  Procedure Laterality Date   COLONOSCOPY     hysterectomy - unknown type     LUMBAR FUSION  1983   OOPHORECTOMY     POLYPECTOMY     TUBAL LIGATION      reports that she quit smoking about 42 years ago. Her smoking use included cigarettes. She has never used smokeless tobacco. She reports that she does not currently use drugs. She reports that she does not drink alcohol. family history includes Breast cancer (age of onset: 71) in her daughter; COPD in her mother and another family member; Colon polyps in her brother and sister; Coronary artery disease in an other family member; Hyperlipidemia in an other family member; Leukemia in her sister, sister and another family member; Liver cancer in an other family member;  Lung cancer in an other family member; Melanoma in an other family member; Osteoporosis in her mother and another family member. Allergies  Allergen Reactions   Amoxicillin Itching    Curam---from [place]   Codeine Itching   Current Outpatient Medications on File Prior to Visit  Medication Sig Dispense Refill   aspirin 81 MG tablet Take 81 mg by mouth daily.     carvedilol (COREG) 25 MG tablet Take 1 tablet (25 mg total) by mouth 2 (two) times daily. 180 tablet 3   chlorthalidone (HYGROTON) 25 MG tablet Take 1 tablet (25 mg total) by mouth daily. 90 tablet 3   cholecalciferol (VITAMIN D3) 25 MCG  (1000 UT) tablet Take 1,000 Units by mouth daily.     dapagliflozin propanediol (FARXIGA) 10 MG TABS tablet Take 1 tablet (10 mg total) by mouth daily before breakfast. 90 tablet 3   lisinopril (ZESTRIL) 20 MG tablet TAKE 1 TABLET BY MOUTH TWICE A DAY 180 tablet 2   metFORMIN (GLUCOPHAGE-XR) 500 MG 24 hr tablet Take 1 tablet (500 mg total) by mouth 2 (two) times daily with a meal. 180 tablet 1   pravastatin (PRAVACHOL) 80 MG tablet TAKE 1 TABLET BY MOUTH EVERY DAY 90 tablet 3   promethazine-dextromethorphan (PROMETHAZINE-DM) 6.25-15 MG/5ML syrup Take 5 mLs by mouth 4 (four) times daily as needed for cough. 118 mL 0   No current facility-administered medications on file prior to visit.        ROS:  All others reviewed and negative.  Objective        PE:  BP 128/72   Pulse 68   Temp 98 F (36.7 C) (Temporal)   Ht 5\' 4"  (1.626 m)   Wt 130 lb 6 oz (59.1 kg)   SpO2 96%   BMI 22.38 kg/m                 Constitutional: Pt appears in NAD               HENT: Head: NCAT.                Right Ear: External ear normal.                 Left Ear: External ear normal.                Eyes: . Pupils are equal, round, and reactive to light. Conjunctivae and EOM are normal               Nose: without d/c or deformity               Neck: Neck supple. Gross normal ROM               Cardiovascular: Normal rate and regular rhythm.                 Pulmonary/Chest: Effort normal and breath sounds without rales or wheezing.                Abd:  Soft, NT, ND, + BS, no organomegaly               Neurological: Pt is alert. At baseline orientation, motor grossly intact               Skin: Skin is warm. No rashes, no other new lesions, LE edema - none               Psychiatric: Pt behavior is normal without  agitation   Micro: none  Cardiac tracings I have personally interpreted today:  none  Pertinent Radiological findings (summarize): none   Lab Results  Component Value Date   WBC 5.1 05/03/2023    HGB 14.4 05/03/2023   HCT 43.0 05/03/2023   PLT 138.0 (L) 05/03/2023   GLUCOSE 128 (H) 05/03/2023   CHOL 135 05/03/2023   TRIG 180.0 (H) 05/03/2023   HDL 36.70 (L) 05/03/2023   LDLDIRECT 83.0 12/22/2020   LDLCALC 62 05/03/2023   ALT 16 05/03/2023   AST 18 05/03/2023   NA 141 05/03/2023   K 3.8 05/03/2023   CL 101 05/03/2023   CREATININE 1.18 05/03/2023   BUN 24 (H) 05/03/2023   CO2 32 05/03/2023   TSH 5.60 (H) 05/03/2023   INR 1.0 ratio 03/25/2010   HGBA1C 6.9 (H) 05/03/2023   MICROALBUR 1.7 05/03/2023   Assessment/Plan:  Carol Lawrence is a 87 y.o. White or Caucasian [1] female with  has a past medical history of Allergic rhinitis, Bell's palsy, Cancer (HCC), Cardiomyopathy, dilated (HCC) (08/26/2011), Carotid stenosis (09/12/2012), CHF (congestive heart failure) (HCC), Clotting disorder (HCC), Colonic polyp, DVT (deep venous thrombosis) (HCC), Glucose intolerance (impaired glucose tolerance), HLD (hyperlipidemia), HTN (hypertension), NICM (nonischemic cardiomyopathy) (HCC), and PVD (peripheral vascular disease) (HCC).  Encounter for well adult exam with abnormal findings Age and sex appropriate education and counseling updated with regular exercise and diet Referrals for preventative services - none needed Immunizations addressed - none needed Smoking counseling  - none needed Evidence for depression or other mood disorder - none significant Most recent labs reviewed. I have personally reviewed and have noted: 1) the patient's medical and social history 2) The patient's current medications and supplements 3) The patient's height, weight, and BMI have been recorded in the chart   HLD (hyperlipidemia) Lab Results  Component Value Date   LDLCALC 62 05/03/2023   Stable, pt to continue current statin pravachol 80 qd   Essential hypertension BP Readings from Last 3 Encounters:  05/03/23 128/72  11/02/22 (!) 106/42  10/05/22 120/62   Stable, pt to continue medical  treatment lisinopril 20 qd, chlorthalidone 25 d, coreg 25 bid   Diabetes (HCC) Lab Results  Component Value Date   HGBA1C 6.9 (H) 05/03/2023   Stable, pt to continue current medical treatment farxiga 10 qd, metformin ER 500 bid  CKD (chronic kidney disease) stage 3, GFR 30-59 ml/min Lab Results  Component Value Date   CREATININE 1.18 05/03/2023   Stable overall, cont to avoid nephrotoxins   Vitamin D deficiency Last vitamin D Lab Results  Component Value Date   VD25OH 50.83 05/03/2023   Stable, cont oral replacement   Peripheral neuropathy New mild worsening, for B12 with lab, declines need for pain control  Abnormal TSH Lab Results  Component Value Date   TSH 5.60 (H) 05/03/2023   Mild elevated, cont current tx for now, asympt, for f/u lab next visit  Followup: Return in about 6 months (around 11/03/2023).  Oliver Barre, MD 05/06/2023 8:34 PM [facility]

## 2023-05-03 NOTE — Patient Instructions (Signed)

## 2023-05-06 ENCOUNTER — Encounter: Payer: Self-pay | Admitting: Internal Medicine

## 2023-05-06 DIAGNOSIS — R7989 Other specified abnormal findings of blood chemistry: Secondary | ICD-10-CM | POA: Insufficient documentation

## 2023-05-06 NOTE — Assessment & Plan Note (Signed)
Lab Results  Component Value Date   LDLCALC 62 05/03/2023   Stable, pt to continue current statin pravachol 80 qd

## 2023-05-06 NOTE — Assessment & Plan Note (Signed)
New mild worsening, for B12 with lab, declines need for pain control

## 2023-05-06 NOTE — Assessment & Plan Note (Signed)
Lab Results  Component Value Date   TSH 5.60 (H) 05/03/2023   Mild elevated, cont current tx for now, asympt, for f/u lab next visit

## 2023-05-06 NOTE — Assessment & Plan Note (Signed)
Last vitamin D Lab Results  Component Value Date   VD25OH 50.83 05/03/2023   Stable, cont oral replacement

## 2023-05-06 NOTE — Assessment & Plan Note (Signed)
Lab Results  Component Value Date   HGBA1C 6.9 (H) 05/03/2023   Stable, pt to continue current medical treatment farxiga 10 qd, metformin ER 500 bid

## 2023-05-06 NOTE — Assessment & Plan Note (Signed)
Lab Results  Component Value Date   CREATININE 1.18 05/03/2023   Stable overall, cont to avoid nephrotoxins

## 2023-05-06 NOTE — Assessment & Plan Note (Signed)
BP Readings from Last 3 Encounters:  05/03/23 128/72  11/02/22 (!) 106/42  10/05/22 120/62   Stable, pt to continue medical treatment lisinopril 20 qd, chlorthalidone 25 d, coreg 25 bid

## 2023-05-06 NOTE — Assessment & Plan Note (Signed)

## 2023-05-16 ENCOUNTER — Telehealth: Payer: Self-pay | Admitting: Internal Medicine

## 2023-05-16 NOTE — Telephone Encounter (Signed)
Pt want to know if someone can go over or mail his blood work results.

## 2023-05-16 NOTE — Telephone Encounter (Signed)
Ok to resend letter done May 03 2023   thanks

## 2023-06-01 NOTE — Telephone Encounter (Signed)
Patient called and said she has not received the results that were mailed to her. She would like for them to be re-sent to the address on file. Best callback is [phone].

## 2023-06-02 NOTE — Telephone Encounter (Signed)
Labs printed and placed to be sent

## 2023-06-27 DIAGNOSIS — E1122 Type 2 diabetes mellitus with diabetic chronic kidney disease: Secondary | ICD-10-CM | POA: Diagnosis not present

## 2023-06-27 DIAGNOSIS — N189 Chronic kidney disease, unspecified: Secondary | ICD-10-CM | POA: Diagnosis not present

## 2023-06-27 DIAGNOSIS — Z8249 Family history of ischemic heart disease and other diseases of the circulatory system: Secondary | ICD-10-CM | POA: Diagnosis not present

## 2023-06-27 DIAGNOSIS — I429 Cardiomyopathy, unspecified: Secondary | ICD-10-CM | POA: Diagnosis not present

## 2023-06-27 DIAGNOSIS — E785 Hyperlipidemia, unspecified: Secondary | ICD-10-CM | POA: Diagnosis not present

## 2023-06-27 DIAGNOSIS — Z7984 Long term (current) use of oral hypoglycemic drugs: Secondary | ICD-10-CM | POA: Diagnosis not present

## 2023-06-27 DIAGNOSIS — Z87891 Personal history of nicotine dependence: Secondary | ICD-10-CM | POA: Diagnosis not present

## 2023-06-27 DIAGNOSIS — I509 Heart failure, unspecified: Secondary | ICD-10-CM | POA: Diagnosis not present

## 2023-06-27 DIAGNOSIS — Z008 Encounter for other general examination: Secondary | ICD-10-CM | POA: Diagnosis not present

## 2023-06-27 DIAGNOSIS — I130 Hypertensive heart and chronic kidney disease with heart failure and stage 1 through stage 4 chronic kidney disease, or unspecified chronic kidney disease: Secondary | ICD-10-CM | POA: Diagnosis not present

## 2023-06-27 DIAGNOSIS — Z7982 Long term (current) use of aspirin: Secondary | ICD-10-CM | POA: Diagnosis not present

## 2023-07-25 ENCOUNTER — Encounter: Payer: Self-pay | Admitting: Internal Medicine

## 2023-08-31 ENCOUNTER — Other Ambulatory Visit: Payer: Self-pay | Admitting: Obstetrics and Gynecology

## 2023-08-31 DIAGNOSIS — Z1231 Encounter for screening mammogram for malignant neoplasm of breast: Secondary | ICD-10-CM

## 2023-09-15 ENCOUNTER — Other Ambulatory Visit: Payer: Self-pay | Admitting: Internal Medicine

## 2023-09-15 ENCOUNTER — Other Ambulatory Visit: Payer: Self-pay

## 2023-09-26 ENCOUNTER — Ambulatory Visit (INDEPENDENT_AMBULATORY_CARE_PROVIDER_SITE_OTHER): Payer: Medicare HMO

## 2023-09-26 DIAGNOSIS — Z23 Encounter for immunization: Secondary | ICD-10-CM

## 2023-09-26 NOTE — Progress Notes (Signed)
Patient presented in office today for Her HD Flu Vaccine. HD Flu Vaccine was administered into her Left Deltoid Muscle. Patient tolerated injection well and the injection site looked fine. Patient was advised to report to the office immediately if she notices's any adverse reactions.

## 2023-10-06 ENCOUNTER — Other Ambulatory Visit: Payer: Self-pay | Admitting: Cardiology

## 2023-10-10 ENCOUNTER — Ambulatory Visit
Admission: RE | Admit: 2023-10-10 | Discharge: 2023-10-10 | Disposition: A | Discharge status: Home / Self Care | Payer: Medicare HMO | Source: Ambulatory Visit | Attending: Obstetrics and Gynecology | Admitting: Obstetrics and Gynecology

## 2023-10-10 DIAGNOSIS — Z1231 Encounter for screening mammogram for malignant neoplasm of breast: Secondary | ICD-10-CM

## 2023-10-19 DIAGNOSIS — D485 Neoplasm of uncertain behavior of skin: Secondary | ICD-10-CM | POA: Diagnosis not present

## 2023-10-19 DIAGNOSIS — L821 Other seborrheic keratosis: Secondary | ICD-10-CM | POA: Diagnosis not present

## 2023-10-19 DIAGNOSIS — L814 Other melanin hyperpigmentation: Secondary | ICD-10-CM | POA: Diagnosis not present

## 2023-10-19 DIAGNOSIS — D225 Melanocytic nevi of trunk: Secondary | ICD-10-CM | POA: Diagnosis not present

## 2023-10-19 DIAGNOSIS — L820 Inflamed seborrheic keratosis: Secondary | ICD-10-CM | POA: Diagnosis not present

## 2023-10-31 ENCOUNTER — Other Ambulatory Visit: Payer: Self-pay | Admitting: Cardiology

## 2023-11-02 DIAGNOSIS — R931 Abnormal findings on diagnostic imaging of heart and coronary circulation: Secondary | ICD-10-CM | POA: Insufficient documentation

## 2023-11-02 NOTE — Progress Notes (Signed)
Cardiology Office Note:   Date:  11/03/2023  ID:  Carol Lawrence, DOB June 14, 1936, MRN [id number] PCP: Corwin Levins, MD  [facility] Providers Cardiologist:  Rollene Rotunda, MD {  History of Present Illness:   Carol Lawrence is a 87 y.o. female who presents for followup of her cardiomyopathy. Her EF improved from a low of 15% to 55%.  However, her EF was slightly lower at 45% in 2016 on follow up echo. This was 50 - 55% in 2022.  She had mild MR.  She had a calcium score in 10/31 that was 776 and listed as 83rd percentile for her age and gender.      Since I last saw her she has had no new cardiovascular problems except she has been having a cough.  This has been going on for about a month.  She feels like there is some phlegm in her throat.  She has paroxysms of coughing.  She is not describing new shortness of breath, PND or orthopnea.  She is not having any new palpitations, presyncope or syncope.  She has had no weight gain or edema.  There was not anything overt on the lung portion of her recent CT.  She denies any fevers or chills.   ROS: As stated in the HPI and negative for all other systems.  Studies Reviewed:    EKG:   EKG Interpretation Date/Time:  Friday November 03 2023 11:10:51 EST Ventricular Rate:  67 PR Interval:  162 QRS Duration:  96 QT Interval:  406 QTC Calculation: 429 R Axis:   -4  Text Interpretation: Normal sinus rhythm Nonspecific ST abnormality When compared with ECG of 15-Aug-2004 10:43, No significant change was found Confirmed by Rollene Rotunda ([id number]) on 11/03/2023 11:54:14 AM     Risk Assessment/Calculations:           Physical Exam:   VS:  BP 122/88   Pulse 67   Ht 5\' 5"  (1.651 m)   Wt 130 lb 9.6 oz (59.2 kg)   SpO2 97%   BMI 21.73 kg/m    Wt Readings from Last 3 Encounters:  11/03/23 130 lb 9.6 oz (59.2 kg)  05/03/23 130 lb 6 oz (59.1 kg)  11/02/22 128 lb (58.1 kg)     GEN: Well nourished, well developed in no  acute distress NECK: No JVD; No carotid bruits CARDIAC: RRR, no murmurs, rubs, gallops RESPIRATORY:  Clear to auscultation without rales, wheezing or rhonchi  ABDOMEN: Soft, non-tender, non-distended EXTREMITIES:  No edema; No deformity   ASSESSMENT AND PLAN:   NICM:  EF was 50 to 55%  2 years ago.  She has no new symptoms.  No change in therapy.   MR: I would not suspect that this was clinically any different.  No change in therapy.   Hypertension:    The blood pressure is managed and can be addressed as below.  For now she wants to continue the meds as listed.   Cough: She has a cough but no fevers or chills.  This could be related to lisinopril.  We discussed this but she would like to address other possibilities with her primary provider before considering coming off the lisinopril.  If she does not get better and there is no other etiology I would stop the lisinopril and switch her to amlodipine.    Elevated coronary calcium: She is not having any symptoms related to this.  The clinical utility of coronary calcium scoring at this  stage is not really known.  No change in therapy or further testing.     Follow up with me in one year or sooner for the cough if it does not resolve.   Signed, Rollene Rotunda, MD

## 2023-11-03 ENCOUNTER — Ambulatory Visit: Payer: Medicare HMO | Attending: Cardiology | Admitting: Cardiology

## 2023-11-03 ENCOUNTER — Encounter: Payer: Self-pay | Admitting: Cardiology

## 2023-11-03 VITALS — BP 122/88 | HR 67 | Ht 65.0 in | Wt 130.6 lb

## 2023-11-03 DIAGNOSIS — R931 Abnormal findings on diagnostic imaging of heart and coronary circulation: Secondary | ICD-10-CM | POA: Diagnosis not present

## 2023-11-03 DIAGNOSIS — I10 Essential (primary) hypertension: Secondary | ICD-10-CM

## 2023-11-03 DIAGNOSIS — N1831 Chronic kidney disease, stage 3a: Secondary | ICD-10-CM

## 2023-11-03 DIAGNOSIS — I340 Nonrheumatic mitral (valve) insufficiency: Secondary | ICD-10-CM | POA: Diagnosis not present

## 2023-11-03 DIAGNOSIS — I428 Other cardiomyopathies: Secondary | ICD-10-CM | POA: Diagnosis not present

## 2023-11-03 NOTE — Patient Instructions (Signed)
Medication Instructions:  Your physician recommends that you continue on your current medications as directed. Please refer to the Current Medication list given to you today.  *If you need a refill on your cardiac medications before your next appointment, please call your pharmacy*  Lab Work: If you have labs (blood work) drawn today and your tests are completely normal, you will receive your results only by: MyChart Message (if you have MyChart) OR A paper copy in the mail If you have any lab test that is abnormal or we need to change your treatment, we will call you to review the results.  Testing/Procedures: None ordered today.  Follow-Up: At [facility], you and your health needs are our priority.  As part of our continuing mission to provide you with exceptional heart care, we have created designated Provider Care Teams.  These Care Teams include your primary Cardiologist (physician) and Advanced Practice Providers (APPs -  Physician Assistants and Nurse Practitioners) who all work together to provide you with the care you need, when you need it.  We recommend signing up for the patient portal called "MyChart".  Sign up information is provided on this After Visit Summary.  MyChart is used to connect with patients for Virtual Visits (Telemedicine).  Patients are able to view lab/test results, encounter notes, upcoming appointments, etc.  Non-urgent messages can be sent to your provider as well.   To learn more about what you can do with MyChart, go to [email or web address].    Your next appointment:   1 year(s)  Provider:   Rollene Rotunda, MD

## 2024-01-07 ENCOUNTER — Other Ambulatory Visit: Payer: Self-pay | Admitting: Cardiology

## 2024-01-25 ENCOUNTER — Other Ambulatory Visit: Payer: Self-pay | Admitting: Cardiology

## 2024-02-06 ENCOUNTER — Other Ambulatory Visit: Payer: Self-pay | Admitting: Cardiology

## 2024-02-18 ENCOUNTER — Other Ambulatory Visit: Payer: Self-pay | Admitting: Cardiology

## 2024-02-28 ENCOUNTER — Other Ambulatory Visit: Payer: Self-pay | Admitting: Internal Medicine

## 2024-02-28 DIAGNOSIS — G629 Polyneuropathy, unspecified: Secondary | ICD-10-CM

## 2024-05-03 ENCOUNTER — Encounter: Admitting: Internal Medicine

## 2024-05-07 ENCOUNTER — Ambulatory Visit (INDEPENDENT_AMBULATORY_CARE_PROVIDER_SITE_OTHER): Admitting: Internal Medicine

## 2024-05-07 VITALS — BP 118/72 | HR 65 | Temp 98.7°F | Ht 65.0 in | Wt 128.0 lb

## 2024-05-07 DIAGNOSIS — C539 Malignant neoplasm of cervix uteri, unspecified: Secondary | ICD-10-CM | POA: Insufficient documentation

## 2024-05-07 DIAGNOSIS — I10 Essential (primary) hypertension: Secondary | ICD-10-CM

## 2024-05-07 DIAGNOSIS — E7800 Pure hypercholesterolemia, unspecified: Secondary | ICD-10-CM

## 2024-05-07 DIAGNOSIS — G6289 Other specified polyneuropathies: Secondary | ICD-10-CM | POA: Diagnosis not present

## 2024-05-07 DIAGNOSIS — Z0000 Encounter for general adult medical examination without abnormal findings: Secondary | ICD-10-CM

## 2024-05-07 DIAGNOSIS — N1831 Chronic kidney disease, stage 3a: Secondary | ICD-10-CM

## 2024-05-07 DIAGNOSIS — E538 Deficiency of other specified B group vitamins: Secondary | ICD-10-CM | POA: Diagnosis not present

## 2024-05-07 DIAGNOSIS — E1165 Type 2 diabetes mellitus with hyperglycemia: Secondary | ICD-10-CM | POA: Diagnosis not present

## 2024-05-07 DIAGNOSIS — E611 Iron deficiency: Secondary | ICD-10-CM

## 2024-05-07 DIAGNOSIS — E559 Vitamin D deficiency, unspecified: Secondary | ICD-10-CM | POA: Diagnosis not present

## 2024-05-07 DIAGNOSIS — M1812 Unilateral primary osteoarthritis of first carpometacarpal joint, left hand: Secondary | ICD-10-CM

## 2024-05-07 DIAGNOSIS — Z7984 Long term (current) use of oral hypoglycemic drugs: Secondary | ICD-10-CM

## 2024-05-07 DIAGNOSIS — Z0001 Encounter for general adult medical examination with abnormal findings: Secondary | ICD-10-CM

## 2024-05-07 DIAGNOSIS — Z86718 Personal history of other venous thrombosis and embolism: Secondary | ICD-10-CM | POA: Insufficient documentation

## 2024-05-07 LAB — BASIC METABOLIC PANEL WITH GFR
BUN: 22 mg/dL (ref 6–23)
CO2: 29 meq/L (ref 19–32)
Calcium: 10 mg/dL (ref 8.4–10.5)
Chloride: 101 meq/L (ref 96–112)
Creatinine, Ser: 1.21 mg/dL — ABNORMAL HIGH (ref 0.40–1.20)
GFR: 40.84 mL/min — ABNORMAL LOW (ref >60.00)
Glucose, Bld: 122 mg/dL — ABNORMAL HIGH (ref 70–99)
Potassium: 3.5 meq/L (ref 3.5–5.1)
Sodium: 141 meq/L (ref 135–145)

## 2024-05-07 LAB — VITAMIN B12: Vitamin B-12: 392 pg/mL (ref 211–911)

## 2024-05-07 LAB — LIPID PANEL
Cholesterol: 127 mg/dL (ref 0–200)
HDL: 35.5 mg/dL — ABNORMAL LOW (ref >39.00)
LDL Cholesterol: 48 mg/dL (ref 0–99)
NonHDL: 91.12
Total CHOL/HDL Ratio: 4
Triglycerides: 214 mg/dL — ABNORMAL HIGH (ref 0.0–149.0)
VLDL: 42.8 mg/dL — ABNORMAL HIGH (ref 0.0–40.0)

## 2024-05-07 LAB — CBC WITH DIFFERENTIAL/PLATELET
Basophils Absolute: 0 10*3/uL (ref 0.0–0.1)
Basophils Relative: 0.6 % (ref 0.0–3.0)
Eosinophils Absolute: 0.1 10*3/uL (ref 0.0–0.7)
Eosinophils Relative: 2.7 % (ref 0.0–5.0)
HCT: 42 % (ref 36.0–46.0)
Hemoglobin: 14 g/dL (ref 12.0–15.0)
Lymphocytes Relative: 23.3 % (ref 12.0–46.0)
Lymphs Abs: 1.3 10*3/uL (ref 0.7–4.0)
MCHC: 33.2 g/dL (ref 30.0–36.0)
MCV: 90.2 fl (ref 78.0–100.0)
Monocytes Absolute: 0.5 10*3/uL (ref 0.1–1.0)
Monocytes Relative: 8.6 % (ref 3.0–12.0)
Neutro Abs: 3.5 10*3/uL (ref 1.4–7.7)
Neutrophils Relative %: 64.8 % (ref 43.0–77.0)
Platelets: 132 10*3/uL — ABNORMAL LOW (ref 150.0–400.0)
RBC: 4.66 Mil/uL (ref 3.87–5.11)
RDW: 14 % (ref 11.5–15.5)
WBC: 5.4 10*3/uL (ref 4.0–10.5)

## 2024-05-07 LAB — IBC PANEL
Iron: 80 ug/dL (ref 42–145)
Saturation Ratios: 20.6 % (ref 20.0–50.0)
TIBC: 389.2 ug/dL (ref 250.0–450.0)
Transferrin: 278 mg/dL (ref 212.0–360.0)

## 2024-05-07 LAB — URINALYSIS, ROUTINE W REFLEX MICROSCOPIC
Bilirubin Urine: NEGATIVE
Hgb urine dipstick: NEGATIVE
Ketones, ur: NEGATIVE
Leukocytes,Ua: NEGATIVE
Nitrite: NEGATIVE
Specific Gravity, Urine: 1.02 (ref 1.000–1.030)
Total Protein, Urine: NEGATIVE
Urine Glucose: NEGATIVE
Urobilinogen, UA: 0.2 (ref 0.0–1.0)
pH: 6 (ref 5.0–8.0)

## 2024-05-07 LAB — HEPATIC FUNCTION PANEL
ALT: 16 U/L (ref 0–35)
AST: 18 U/L (ref 0–37)
Albumin: 4.2 g/dL (ref 3.5–5.2)
Alkaline Phosphatase: 41 U/L (ref 39–117)
Bilirubin, Direct: 0.1 mg/dL (ref 0.0–0.3)
Total Bilirubin: 0.5 mg/dL (ref 0.2–1.2)
Total Protein: 6.9 g/dL (ref 6.0–8.3)

## 2024-05-07 LAB — TSH: TSH: 3.91 u[IU]/mL (ref 0.35–5.50)

## 2024-05-07 LAB — VITAMIN D 25 HYDROXY (VIT D DEFICIENCY, FRACTURES): VITD: 54.47 ng/mL (ref 30.00–100.00)

## 2024-05-07 LAB — HEMOGLOBIN A1C: Hgb A1c MFr Bld: 6.9 % — ABNORMAL HIGH (ref 4.6–6.5)

## 2024-05-07 LAB — FERRITIN: Ferritin: 32.4 ng/mL (ref 10.0–291.0)

## 2024-05-07 NOTE — Patient Instructions (Signed)

## 2024-05-07 NOTE — Progress Notes (Signed)
 Patient ID: Carol Lawrence, female   DOB: 10-03-1936, 88 y.o.   MRN: [id number]         Chief Complaint:: wellness exam and neruopathy, left thumb djd,hld, dm, htn, ckd3a, low vit d       HPI:  Carol Lawrence is a 88 y.o. female here for wellness exam; pt will call for eye exam soon. O/w up to date                        Also Pt denies chest pain, increased sob or doe, wheezing, orthopnea, PND, increased LE swelling, palpitations, dizziness or syncope.   Pt denies polydipsia, polyuria, or new focal neuro s/s except for mild worsening distal LE numbness without worsening pain or weakness.  Pt denies fever, wt loss, night sweats, loss of appetite, or other constitutional symptoms  Has mild worsening eft thumb arthritis pain.     Wt Readings from Last 3 Encounters:  05/07/24 128 lb (58.1 kg)  11/03/23 130 lb 9.6 oz (59.2 kg)  05/03/23 130 lb 6 oz (59.1 kg)   BP Readings from Last 3 Encounters:  05/07/24 118/72  11/03/23 122/88  05/03/23 128/72   Immunization History  Administered Date(s) Administered   Fluad Quad(high Dose 65+) 09/13/2019, 10/05/2022   Fluad Trivalent(High Dose 65+) 09/26/2023   Influenza Split 09/14/2011, 09/18/2012   Influenza Whole 10/23/2007, 09/23/2008, 09/29/2009, 09/09/2010   Influenza, High Dose Seasonal PF 09/13/2016, 09/07/2017, 10/11/2018, 09/28/2021   Influenza,inj,Quad PF,6+ Mos 09/17/2013, 09/09/2014, 09/15/2015   Influenza-Unspecified 09/18/2018, 09/30/2020, 10/05/2021   PFIZER Comirnaty(Gray Top)Covid-19 Tri-Sucrose Vaccine 05/25/2021   PFIZER(Purple Top)SARS-COV-2 Vaccination 01/08/2020, 02/04/2020, 09/21/2020   Pneumococcal Conjugate-13 09/17/2013   Pneumococcal Polysaccharide-23 08/19/2005, 08/30/2011   Respiratory Syncytial Virus Vaccine,Recomb Aduvanted(Arexvy) 04/13/2024   Td 10/19/1998, 01/16/2009   Tdap 08/06/2019   Zoster Recombinant(Shingrix) 02/05/2018, 05/31/2018   Zoster, Live 10/18/2006   Health Maintenance Due  Topic Date Due    OPHTHALMOLOGY EXAM  06/02/2023   Medicare Annual Wellness (AWV)  10/04/2023   HEMOGLOBIN A1C  11/03/2023   Diabetic kidney evaluation - eGFR measurement  05/02/2024   Diabetic kidney evaluation - Urine ACR  05/02/2024      Past Medical History:  Diagnosis Date   Allergic rhinitis    Bell's palsy    Cancer (HCC)    [phone]   Cardiomyopathy, dilated (HCC) 08/26/2011   15% 2011, 55% 2012   Carotid stenosis 09/12/2012   Bilateral, mild [facility] screening   CHF (congestive heart failure) (HCC)    Clotting disorder (HCC)    Colonic polyp    DVT (deep venous thrombosis) (HCC)    Glucose intolerance (impaired glucose tolerance)    HLD (hyperlipidemia)    HTN (hypertension)    NICM (nonischemic cardiomyopathy) (HCC)    Coronaries; left main essentially was nonexistent or very short with   PVD (peripheral vascular disease) (HCC)    Past Surgical History:  Procedure Laterality Date   COLONOSCOPY     hysterectomy - unknown type     LUMBAR FUSION  1983   OOPHORECTOMY     POLYPECTOMY     TUBAL LIGATION      reports that she quit smoking about 43 years ago. Her smoking use included cigarettes. She has never used smokeless tobacco. She reports that she does not currently use drugs. She reports that she does not drink alcohol. family history includes Breast cancer (age of onset: 42) in her daughter; COPD in her mother and another  family member; Colon polyps in her brother and sister; Coronary artery disease in an other family member; Hyperlipidemia in an other family member; Leukemia in her sister, sister and another family member; Liver cancer in an other family member; Lung cancer in an other family member; Melanoma in an other family member; Osteoporosis in her mother and another family member. Allergies  Allergen Reactions   Amoxicillin Itching    Curam---from [facility]   Codeine Itching   Current Outpatient Medications on File Prior to Visit  Medication Sig Dispense Refill    aspirin 81 MG tablet Take 81 mg by mouth daily.     carvedilol  (COREG ) 25 MG tablet TAKE 1 TABLET BY MOUTH TWICE A DAY 180 tablet 3   chlorthalidone  (HYGROTON ) 25 MG tablet TAKE 1 TABLET (25 MG TOTAL) BY MOUTH DAILY. 90 tablet 3   cholecalciferol (VITAMIN D3) 25 MCG (1000 UT) tablet Take 1,000 Units by mouth daily.     dapagliflozin  propanediol (FARXIGA ) 10 MG TABS tablet Take 1 tablet (10 mg total) by mouth daily before breakfast. 90 tablet 3   lisinopril  (ZESTRIL ) 20 MG tablet TAKE 1 TABLET BY MOUTH TWICE A DAY 180 tablet 2   metFORMIN  (GLUCOPHAGE -XR) 500 MG 24 hr tablet TAKE 1 TABLET BY MOUTH 2 TIMES DAILY WITH A MEAL. 180 tablet 3   pravastatin  (PRAVACHOL ) 80 MG tablet TAKE 1 TABLET BY MOUTH EVERY DAY 90 tablet 2   promethazine -dextromethorphan (PROMETHAZINE -DM) 6.25-15 MG/5ML syrup Take 5 mLs by mouth 4 (four) times daily as needed for cough. 118 mL 0   No current facility-administered medications on file prior to visit.        ROS:  All others reviewed and negative.  Objective        PE:  BP 118/72 (BP Location: Right Arm, Patient Position: Sitting, Cuff Size: Normal)   Pulse 65   Temp 98.7 F (37.1 C) (Oral)   Ht 5\' 5"  (1.651 m)   Wt 128 lb (58.1 kg)   SpO2 98%   BMI 21.30 kg/m                 Constitutional: Pt appears in NAD               HENT: Head: NCAT.                Right Ear: External ear normal.                 Left Ear: External ear normal.                Eyes: . Pupils are equal, round, and reactive to light. Conjunctivae and EOM are normal               Nose: without d/c or deformity               Neck: Neck supple. Gross normal ROM               Cardiovascular: Normal rate and regular rhythm.                 Pulmonary/Chest: Effort normal and breath sounds without rales or wheezing.                Abd:  Soft, NT, ND, + BS, no organomegaly               Neurological: Pt is alert. At baseline orientation, motor grossly intact  Skin: Skin is warm. No  rashes, no other new lesions, LE edema - none               Psychiatric: Pt behavior is normal without agitation   Micro: none  Cardiac tracings I have personally interpreted today:  none  Pertinent Radiological findings (summarize): none   Lab Results  Component Value Date   WBC 5.1 05/03/2023   HGB 14.4 05/03/2023   HCT 43.0 05/03/2023   PLT 138.0 (L) 05/03/2023   GLUCOSE 128 (H) 05/03/2023   CHOL 135 05/03/2023   TRIG 180.0 (H) 05/03/2023   HDL 36.70 (L) 05/03/2023   LDLDIRECT 83.0 12/22/2020   LDLCALC 62 05/03/2023   ALT 16 05/03/2023   AST 18 05/03/2023   NA 141 05/03/2023   K 3.8 05/03/2023   CL 101 05/03/2023   CREATININE 1.18 05/03/2023   BUN 24 (H) 05/03/2023   CO2 32 05/03/2023   TSH 5.60 (H) 05/03/2023   INR 1.0 ratio 03/25/2010   HGBA1C 6.9 (H) 05/03/2023   MICROALBUR 1.7 05/03/2023   Assessment/Plan:  Cici Rodriges Zelaya is a 88 y.o. White or Caucasian [1] female with  has a past medical history of Allergic rhinitis, Bell's palsy, Cancer (HCC), Cardiomyopathy, dilated (HCC) (08/26/2011), Carotid stenosis (09/12/2012), CHF (congestive heart failure) (HCC), Clotting disorder (HCC), Colonic polyp, DVT (deep venous thrombosis) (HCC), Glucose intolerance (impaired glucose tolerance), HLD (hyperlipidemia), HTN (hypertension), NICM (nonischemic cardiomyopathy) (HCC), and PVD (peripheral vascular disease) (HCC).  Vitamin D  deficiency Last vitamin D  Lab Results  Component Value Date   VD25OH 50.83 05/03/2023   Stable, cont oral replacement   CKD (chronic kidney disease) stage 3, GFR 30-59 ml/min Lab Results  Component Value Date   CREATININE 1.18 05/03/2023   Stable overall, cont to avoid nephrotoxins   Diabetes (HCC) Lab Results  Component Value Date   HGBA1C 6.9 (H) 05/03/2023   Stable, pt to continue current medical treatment farxiga  10 every day, metformin  ER 500 mg bid   Essential hypertension BP Readings from Last 3 Encounters:  05/07/24 118/72   11/03/23 122/88  05/03/23 128/72   Stable, pt to continue medical treatment coreg  25 bid, , chlorthalidone  25 every day, lisnopril 20 qd   HLD (hyperlipidemia) Lab Results  Component Value Date   LDLCALC 62 05/03/2023   Stable, pt to continue current statin pravastatin  80 qd   Degenerative arthritis of thumb, left Her only pain fortuantely mild to mod, ok for volt gel prn  Peripheral neuropathy With mild numbness only distal legs bilateral,, plans to f/u neurology  Encounter for well adult exam with abnormal findings Age and sex appropriate education and counseling updated with regular exercise and diet Referrals for preventative services - pt states will call lfor optho soon Immunizations addressed - none needed Smoking counseling  - none needed Evidence for depression or other mood disorder - none significant Most recent labs reviewed. I have personally reviewed and have noted: 1) the patient's medical and social history 2) The patient's current medications and supplements 3) The patient's height, weight, and BMI have been recorded in the chart  Followup: Return in about 1 year (around 05/07/2025).  Rosalia Colonel, MD 05/08/2024 7:14 AM [facility]

## 2024-05-08 ENCOUNTER — Ambulatory Visit: Payer: Self-pay | Admitting: Internal Medicine

## 2024-05-08 ENCOUNTER — Encounter: Payer: Self-pay | Admitting: Internal Medicine

## 2024-05-08 DIAGNOSIS — M1812 Unilateral primary osteoarthritis of first carpometacarpal joint, left hand: Secondary | ICD-10-CM | POA: Insufficient documentation

## 2024-05-08 LAB — MICROALBUMIN / CREATININE URINE RATIO
Creatinine,U: 81 mg/dL
Microalb Creat Ratio: 20.4 mg/g (ref 0.0–30.0)
Microalb, Ur: 1.7 mg/dL (ref 0.0–1.9)

## 2024-05-08 NOTE — Assessment & Plan Note (Signed)
Lab Results  Component Value Date   CREATININE 1.18 05/03/2023   Stable overall, cont to avoid nephrotoxins

## 2024-05-08 NOTE — Assessment & Plan Note (Signed)
 Her only pain fortuantely mild to mod, ok for volt gel prn

## 2024-05-08 NOTE — Progress Notes (Signed)
 The test results show that your current treatment is OK, as the tests are stable.  Please continue the same plan.  There is no other need for change of treatment or further evaluation based on these results, at this time.  thanks

## 2024-05-08 NOTE — Assessment & Plan Note (Signed)
 Lab Results  Component Value Date   LDLCALC 62 05/03/2023   Stable, pt to continue current statin pravastatin  80 qd

## 2024-05-08 NOTE — Assessment & Plan Note (Signed)
 With mild numbness only distal legs bilateral,, plans to f/u neurology

## 2024-05-08 NOTE — Assessment & Plan Note (Signed)
Last vitamin D Lab Results  Component Value Date   VD25OH 50.83 05/03/2023   Stable, cont oral replacement

## 2024-05-08 NOTE — Assessment & Plan Note (Signed)
 Lab Results  Component Value Date   HGBA1C 6.9 (H) 05/03/2023   Stable, pt to continue current medical treatment farxiga  10 every day, metformin  ER 500 mg bid

## 2024-05-08 NOTE — Assessment & Plan Note (Signed)
 Age and sex appropriate education and counseling updated with regular exercise and diet Referrals for preventative services - pt states will call lfor optho soon Immunizations addressed - none needed Smoking counseling  - none needed Evidence for depression or other mood disorder - none significant Most recent labs reviewed. I have personally reviewed and have noted: 1) the patient's medical and social history 2) The patient's current medications and supplements 3) The patient's height, weight, and BMI have been recorded in the chart

## 2024-05-08 NOTE — Assessment & Plan Note (Signed)
 BP Readings from Last 3 Encounters:  05/07/24 118/72  11/03/23 122/88  05/03/23 128/72   Stable, pt to continue medical treatment coreg  25 bid, , chlorthalidone  25 every day, lisnopril 20 qd

## 2024-05-22 DIAGNOSIS — H52223 Regular astigmatism, bilateral: Secondary | ICD-10-CM | POA: Diagnosis not present

## 2024-05-22 DIAGNOSIS — Z961 Presence of intraocular lens: Secondary | ICD-10-CM | POA: Diagnosis not present

## 2024-05-22 DIAGNOSIS — H5203 Hypermetropia, bilateral: Secondary | ICD-10-CM | POA: Diagnosis not present

## 2024-05-22 DIAGNOSIS — Z135 Encounter for screening for eye and ear disorders: Secondary | ICD-10-CM | POA: Diagnosis not present

## 2024-05-22 DIAGNOSIS — H524 Presbyopia: Secondary | ICD-10-CM | POA: Diagnosis not present

## 2024-05-28 DIAGNOSIS — L718 Other rosacea: Secondary | ICD-10-CM | POA: Diagnosis not present

## 2024-05-28 DIAGNOSIS — D2261 Melanocytic nevi of right upper limb, including shoulder: Secondary | ICD-10-CM | POA: Diagnosis not present

## 2024-05-28 DIAGNOSIS — L821 Other seborrheic keratosis: Secondary | ICD-10-CM | POA: Diagnosis not present

## 2024-05-28 DIAGNOSIS — L538 Other specified erythematous conditions: Secondary | ICD-10-CM | POA: Diagnosis not present

## 2024-05-28 DIAGNOSIS — L570 Actinic keratosis: Secondary | ICD-10-CM | POA: Diagnosis not present

## 2024-05-28 DIAGNOSIS — L814 Other melanin hyperpigmentation: Secondary | ICD-10-CM | POA: Diagnosis not present

## 2024-05-28 DIAGNOSIS — L820 Inflamed seborrheic keratosis: Secondary | ICD-10-CM | POA: Diagnosis not present

## 2024-08-05 DIAGNOSIS — H26493 Other secondary cataract, bilateral: Secondary | ICD-10-CM | POA: Diagnosis not present

## 2024-08-26 ENCOUNTER — Other Ambulatory Visit: Payer: Self-pay | Admitting: Obstetrics and Gynecology

## 2024-08-26 DIAGNOSIS — Z1231 Encounter for screening mammogram for malignant neoplasm of breast: Secondary | ICD-10-CM

## 2024-09-03 ENCOUNTER — Ambulatory Visit (INDEPENDENT_AMBULATORY_CARE_PROVIDER_SITE_OTHER)

## 2024-09-03 ENCOUNTER — Ambulatory Visit

## 2024-09-03 DIAGNOSIS — Z23 Encounter for immunization: Secondary | ICD-10-CM | POA: Diagnosis not present

## 2024-09-03 NOTE — Progress Notes (Signed)
 Patient visits today to receive her FLU HIGH DOSE injection/vaccine. Patient was informed and tolerated well. Patient was notified to reach out to us  if needed.

## 2024-09-12 ENCOUNTER — Other Ambulatory Visit: Payer: Self-pay | Admitting: Internal Medicine

## 2024-10-10 ENCOUNTER — Ambulatory Visit
Admission: RE | Admit: 2024-10-10 | Discharge: 2024-10-10 | Disposition: A | Discharge status: Home / Self Care | Source: Ambulatory Visit | Attending: Obstetrics and Gynecology | Admitting: Obstetrics and Gynecology

## 2024-10-10 DIAGNOSIS — Z1231 Encounter for screening mammogram for malignant neoplasm of breast: Secondary | ICD-10-CM

## 2024-10-16 ENCOUNTER — Other Ambulatory Visit: Payer: Self-pay | Admitting: Obstetrics and Gynecology

## 2024-10-16 DIAGNOSIS — R928 Other abnormal and inconclusive findings on diagnostic imaging of breast: Secondary | ICD-10-CM

## 2024-10-17 DIAGNOSIS — L570 Actinic keratosis: Secondary | ICD-10-CM | POA: Diagnosis not present

## 2024-10-17 DIAGNOSIS — L821 Other seborrheic keratosis: Secondary | ICD-10-CM | POA: Diagnosis not present

## 2024-10-17 DIAGNOSIS — D485 Neoplasm of uncertain behavior of skin: Secondary | ICD-10-CM | POA: Diagnosis not present

## 2024-10-17 DIAGNOSIS — C44519 Basal cell carcinoma of skin of other part of trunk: Secondary | ICD-10-CM | POA: Diagnosis not present

## 2024-10-17 DIAGNOSIS — L538 Other specified erythematous conditions: Secondary | ICD-10-CM | POA: Diagnosis not present

## 2024-10-17 DIAGNOSIS — L820 Inflamed seborrheic keratosis: Secondary | ICD-10-CM | POA: Diagnosis not present

## 2024-10-17 DIAGNOSIS — L814 Other melanin hyperpigmentation: Secondary | ICD-10-CM | POA: Diagnosis not present

## 2024-10-17 DIAGNOSIS — D1801 Hemangioma of skin and subcutaneous tissue: Secondary | ICD-10-CM | POA: Diagnosis not present

## 2024-10-28 ENCOUNTER — Ambulatory Visit
Admission: RE | Admit: 2024-10-28 | Discharge: 2024-10-28 | Disposition: A | Source: Ambulatory Visit | Attending: Obstetrics and Gynecology | Admitting: Obstetrics and Gynecology

## 2024-10-28 ENCOUNTER — Ambulatory Visit
Admission: RE | Admit: 2024-10-28 | Discharge: 2024-10-28 | Disposition: A | Discharge status: Home / Self Care | Source: Ambulatory Visit | Attending: Obstetrics and Gynecology | Admitting: Obstetrics and Gynecology

## 2024-10-28 ENCOUNTER — Ambulatory Visit

## 2024-10-28 DIAGNOSIS — R928 Other abnormal and inconclusive findings on diagnostic imaging of breast: Secondary | ICD-10-CM | POA: Diagnosis not present

## 2024-10-28 DIAGNOSIS — N6489 Other specified disorders of breast: Secondary | ICD-10-CM | POA: Diagnosis not present

## 2024-10-31 ENCOUNTER — Other Ambulatory Visit: Payer: Self-pay | Admitting: Cardiology

## 2024-11-08 ENCOUNTER — Other Ambulatory Visit: Payer: Self-pay | Admitting: Cardiology

## 2024-11-25 ENCOUNTER — Ambulatory Visit: Payer: Self-pay

## 2024-11-25 NOTE — Telephone Encounter (Signed)
 FYI Only or Action Required?: FYI only for provider: appointment scheduled on 12.9.25.  Patient was last seen in primary care on 05/07/2024 by Norleen Lynwood ORN, MD.  Called Nurse Triage reporting Cough.  Symptoms began several weeks ago.  Interventions attempted: OTC medications: mucinex , robitussin.  Symptoms are: gradually worsening.  Triage Disposition: See Physician Within 24 Hours  Patient/caregiver understands and will follow disposition?: Yes    Copied from CRM [id number]. Topic: Clinical - Red Word Triage >> Nov 25, 2024 11:09 AM Lonell PEDLAR wrote: Red Word that prompted transfer to Nurse Triage: Patient has had an ongoing virus and cough for three weeks, worsening   ----------------------------------------------------------------------- From previous Reason for Contact - Scheduling: Patient/patient representative is calling to schedule an appointment. Refer to attachments for appointment information. Reason for Disposition  [1] Continuous (nonstop) coughing interferes with work or school AND [2] no improvement using cough treatment per Care Advice  Answer Assessment - Initial Assessment Questions 11/17 pt has cough states would get muscle spasms that would make cough worse. Then on thanksgiving she states it became more of a headcold. Nasal congestion, yellow sputum with cough and nose. Yellow discharge from eyes. Had tried mucinex  and robitussin. Denies any fever, pain, or shortness of breath currently. She states she would get episodes of shortness of breath after coughing.     1. ONSET: When did the cough begin?      11/17 with a cough, worsened to head cold on Thanksgiving 2. SEVERITY: How bad is the cough today?      mod 3. SPUTUM: Describe the color of your sputum (e.g., none, dry cough; clear, white, yellow, green)     yellow 4. HEMOPTYSIS: Are you coughing up any blood? If Yes, ask: How much? (e.g., flecks, streaks, tablespoons, etc.)     denies 5.  DIFFICULTY BREATHING: Are you having difficulty breathing? If Yes, ask: How bad is it? (e.g., mild, moderate, severe)      denies 6. FEVER: Do you have a fever? If Yes, ask: What is your temperature, how was it measured, and when did it start?     denies 7. CARDIAC HISTORY: Do you have any history of heart disease? (e.g., heart attack, congestive heart failure)      chf 8. LUNG HISTORY: Do you have any history of lung disease?  (e.g., pulmonary embolus, asthma, emphysema)     no 9. OTHER SYMPTOMS: Do you have any other symptoms? (e.g., runny nose, wheezing, chest pain)       Had wheezing a couple of weeks ago but none now.  Protocols used: Cough - Acute Productive-A-AH

## 2024-11-26 ENCOUNTER — Ambulatory Visit (INDEPENDENT_AMBULATORY_CARE_PROVIDER_SITE_OTHER)

## 2024-11-26 ENCOUNTER — Ambulatory Visit: Admitting: Family Medicine

## 2024-11-26 ENCOUNTER — Ambulatory Visit: Payer: Self-pay | Admitting: Family Medicine

## 2024-11-26 ENCOUNTER — Encounter: Payer: Self-pay | Admitting: Family Medicine

## 2024-11-26 VITALS — BP 112/60 | HR 73 | Temp 98.7°F | Ht 65.0 in | Wt 125.2 lb

## 2024-11-26 DIAGNOSIS — R0602 Shortness of breath: Secondary | ICD-10-CM | POA: Diagnosis not present

## 2024-11-26 DIAGNOSIS — J069 Acute upper respiratory infection, unspecified: Secondary | ICD-10-CM | POA: Diagnosis not present

## 2024-11-26 DIAGNOSIS — B9689 Other specified bacterial agents as the cause of diseases classified elsewhere: Secondary | ICD-10-CM

## 2024-11-26 DIAGNOSIS — R059 Cough, unspecified: Secondary | ICD-10-CM | POA: Diagnosis not present

## 2024-11-26 DIAGNOSIS — I700 Atherosclerosis of aorta: Secondary | ICD-10-CM | POA: Diagnosis not present

## 2024-11-26 MED ORDER — AZITHROMYCIN 250 MG PO TABS: ORAL_TABLET | ORAL | 0 refills | Status: AC

## 2024-11-26 NOTE — Patient Instructions (Signed)
 We are getting an xray today. We will be in contact with any abnormal results that require further attention.  I have sent in azithromycin  for you to take. Take 2 tablets today, then 1 tablet daily for the next 4 days.  Follow-up with me for new or worsening symptoms.

## 2024-11-26 NOTE — Progress Notes (Signed)
 Acute Office Visit  Subjective:     Patient ID: Carol Lawrence, female    DOB: 12-20-1935, 88 y.o.   MRN: [id number]  Chief Complaint  Patient presents with   Acute Visit    Had virus 11/17 for 2wks head cold started 11/28 fever of 100 yesterday LT eye discharge started Saturday/Sunday also c/o SOB     HPI  Discussed the use of AI scribe software for clinical note transcription with the patient, who gave verbal consent to proceed.  History of Present Illness Carol Lawrence is an 88 year old female who presents with a persistent cough and respiratory symptoms.  Cough and sputum production - Persistent cough for approximately ten days following an upper respiratory infection - Cough remains productive of thick yellow sputum - Expectoration requires effort - Episodes of prolonged coughing fits, now improving  Respiratory symptoms - Shortness of breath during coughing episodes - No chest pain or palpitations  Constitutional symptoms - Fatigue and increased sleep - Fever to 100F yesterday - No other significant fevers - No headache or runny nose  Prior and current treatments - Previously used amoxicillin and azithromycin  for infections - Currently using Robitussin and Metamucil for symptom relief, with only temporary benefit     ROS Per HPI      Objective:    BP 112/60 (BP Location: Right Arm, Patient Position: Sitting)   Pulse 73   Temp 98.7 F (37.1 C) (Temporal)   Ht 5' 5 (1.651 m)   Wt 125 lb 3.2 oz (56.8 kg)   SpO2 94%   BMI 20.83 kg/m    Physical Exam Vitals and nursing note reviewed.  Constitutional:      General: She is not in acute distress.    Appearance: She is normal weight.     Comments: Appears fatigued  HENT:     Head: Normocephalic and atraumatic.     Right Ear: External ear normal.     Left Ear: External ear normal.     Nose: Congestion present.     Right Sinus: No maxillary sinus tenderness or frontal sinus tenderness.      Left Sinus: No maxillary sinus tenderness or frontal sinus tenderness.     Mouth/Throat:     Mouth: Mucous membranes are moist.     Comments: Oropharyngeal cobblestoning   Eyes:     Extraocular Movements: Extraocular movements intact.     Pupils: Pupils are equal, round, and reactive to light.  Cardiovascular:     Rate and Rhythm: Normal rate. Rhythm irregular.     Pulses: Normal pulses.     Heart sounds: Normal heart sounds.  Pulmonary:     Effort: Pulmonary effort is normal. No respiratory distress.     Breath sounds: Wheezing and rhonchi present. No rales.     Comments: Productive cough in office Musculoskeletal:        General: Normal range of motion.     Cervical back: Normal range of motion.     Right lower leg: No edema.     Left lower leg: No edema.  Lymphadenopathy:     Cervical: No cervical adenopathy.  Neurological:     General: No focal deficit present.     Mental Status: She is alert and oriented to person, place, and time.  Psychiatric:        Mood and Affect: Mood normal.        Thought Content: Thought content normal.     No results found  for any visits on 11/26/24.      Assessment & Plan:   Assessment and Plan Assessment & Plan Bacterial upper respiratory infection Persistent cough with yellow sputum and recent fever suggests possible bacterial infection. - Prescribed azithromycin . - Ordered chest x-ray, negative for pneumonia today     Orders Placed This Encounter  Procedures   DG Chest 2 View    Standing Status:   Future    Number of Occurrences:   1    Expiration Date:   05/27/2025    Reason for Exam (SYMPTOM  OR DIAGNOSIS REQUIRED):   cough, sob    Preferred imaging location?:   [facility] ordered this encounter  Medications   azithromycin  (ZITHROMAX ) 250 MG tablet    Sig: Take 2 tablets on day 1, then 1 tablet daily on days 2 through 5    Dispense:  6 tablet    Refill:  0    Return if symptoms worsen or fail  to improve.  Corean LITTIE Ku, FNP

## 2024-12-04 ENCOUNTER — Other Ambulatory Visit: Payer: Self-pay | Admitting: Cardiology

## 2024-12-13 ENCOUNTER — Other Ambulatory Visit: Payer: Self-pay | Admitting: Cardiology

## 2024-12-25 ENCOUNTER — Telehealth: Payer: Self-pay

## 2024-12-25 NOTE — Telephone Encounter (Signed)
 Request is for husband and wife.  Copied from CRM #[id number]. Topic: Appointments - Scheduling Inquiry for Clinic >> Dec 25, 2024  1:49 PM Mesmerise C wrote: Reason for CRM: Patient would like if one of the female providers decides to bring her husband Carol Lawrence 12/02/1935 on as a new pt if they would be willing to accept her as well due to them going to appts together and seeing the same provider, if so please call to schedule a TOC  appt

## 2024-12-25 NOTE — Telephone Encounter (Signed)
 Ok with me, but I would not have a say in any particular doctor.   Thanks

## 2024-12-30 ENCOUNTER — Telehealth: Payer: Self-pay

## 2024-12-30 NOTE — Telephone Encounter (Signed)
Are you taking new patients?

## 2024-12-30 NOTE — Telephone Encounter (Signed)
 Copied from CRM [id number]. Topic: General - Other >> Dec 26, 2024  4:26 PM Carol Lawrence wrote: Reason for CRM: Pt would like to know if any of the female providers would be willing to accept her and her husband as new pts since Dr.John will no longer be there. Pt would like a callback with an update.

## 2025-01-08 NOTE — Telephone Encounter (Signed)
 Patient has been made aware.

## 2025-01-10 ENCOUNTER — Telehealth: Payer: Self-pay | Admitting: Cardiology

## 2025-01-15 ENCOUNTER — Encounter: Payer: Self-pay | Admitting: Cardiology

## 2025-01-15 NOTE — Telephone Encounter (Signed)
 Pt needs an appointment, 30 days refill sent.

## 2025-01-15 NOTE — Telephone Encounter (Signed)
 Pt is calling to checking on refill. Pt states she out of medication

## 2025-01-15 NOTE — Telephone Encounter (Signed)
 error

## 2025-01-21 ENCOUNTER — Other Ambulatory Visit: Payer: Self-pay | Admitting: Cardiology

## 2025-02-05 ENCOUNTER — Ambulatory Visit: Admitting: Internal Medicine

## 2025-06-17 ENCOUNTER — Encounter: Admitting: Family Medicine
